# Patient Record
Sex: Female | Born: 1935 | Race: White | Hispanic: No | Smoking: Never smoker
Health system: Southern US, Community
[De-identification: ages and names within clinical notes are randomized; demographics above are authoritative.]

## PROBLEM LIST (undated history)

## (undated) DIAGNOSIS — F0390 Unspecified dementia without behavioral disturbance: Secondary | ICD-10-CM

## (undated) DIAGNOSIS — I219 Acute myocardial infarction, unspecified: Secondary | ICD-10-CM

## (undated) DIAGNOSIS — J449 Chronic obstructive pulmonary disease, unspecified: Secondary | ICD-10-CM

## (undated) DIAGNOSIS — I5020 Unspecified systolic (congestive) heart failure: Secondary | ICD-10-CM

## (undated) DIAGNOSIS — I639 Cerebral infarction, unspecified: Secondary | ICD-10-CM

## (undated) DIAGNOSIS — I10 Essential (primary) hypertension: Secondary | ICD-10-CM

---

## 1999-02-06 ENCOUNTER — Ambulatory Visit (HOSPITAL_BASED_OUTPATIENT_CLINIC_OR_DEPARTMENT_OTHER): Admission: RE | Admit: 1999-02-06 | Discharge: 1999-02-06 | Payer: Self-pay | Admitting: Plastic Surgery

## 1999-03-27 ENCOUNTER — Ambulatory Visit (HOSPITAL_BASED_OUTPATIENT_CLINIC_OR_DEPARTMENT_OTHER): Admission: RE | Admit: 1999-03-27 | Discharge: 1999-03-27 | Payer: Self-pay | Admitting: Plastic Surgery

## 2000-01-10 ENCOUNTER — Encounter: Payer: Self-pay | Admitting: Internal Medicine

## 2000-01-10 ENCOUNTER — Inpatient Hospital Stay (HOSPITAL_COMMUNITY): Admission: EM | Admit: 2000-01-10 | Discharge: 2000-01-15 | Payer: Self-pay | Admitting: Internal Medicine

## 2000-01-11 ENCOUNTER — Encounter: Payer: Self-pay | Admitting: Internal Medicine

## 2000-01-12 ENCOUNTER — Encounter: Payer: Self-pay | Admitting: Internal Medicine

## 2000-08-26 ENCOUNTER — Encounter (INDEPENDENT_AMBULATORY_CARE_PROVIDER_SITE_OTHER): Payer: Self-pay | Admitting: Specialist

## 2000-08-26 ENCOUNTER — Other Ambulatory Visit: Admission: RE | Admit: 2000-08-26 | Discharge: 2000-08-26 | Payer: Self-pay | Admitting: Gastroenterology

## 2000-12-14 ENCOUNTER — Encounter (HOSPITAL_COMMUNITY): Admission: RE | Admit: 2000-12-14 | Discharge: 2001-01-13 | Payer: Self-pay | Admitting: Orthopedic Surgery

## 2001-01-27 ENCOUNTER — Encounter (HOSPITAL_COMMUNITY): Admission: RE | Admit: 2001-01-27 | Discharge: 2001-02-26 | Payer: Self-pay | Admitting: Orthopedic Surgery

## 2001-03-01 ENCOUNTER — Encounter (HOSPITAL_COMMUNITY): Admission: RE | Admit: 2001-03-01 | Discharge: 2001-03-31 | Payer: Self-pay | Admitting: Orthopedic Surgery

## 2001-04-05 ENCOUNTER — Encounter (HOSPITAL_COMMUNITY): Admission: RE | Admit: 2001-04-05 | Discharge: 2001-05-05 | Payer: Self-pay | Admitting: Orthopedic Surgery

## 2001-06-10 ENCOUNTER — Encounter: Payer: Self-pay | Admitting: *Deleted

## 2001-06-10 ENCOUNTER — Inpatient Hospital Stay (HOSPITAL_COMMUNITY): Admission: EM | Admit: 2001-06-10 | Discharge: 2001-06-15 | Payer: Self-pay | Admitting: *Deleted

## 2001-06-10 ENCOUNTER — Encounter: Payer: Self-pay | Admitting: Family Medicine

## 2001-06-14 ENCOUNTER — Encounter: Payer: Self-pay | Admitting: Family Medicine

## 2001-07-19 ENCOUNTER — Ambulatory Visit (HOSPITAL_COMMUNITY): Admission: RE | Admit: 2001-07-19 | Discharge: 2001-07-19 | Payer: Self-pay | Admitting: Family Medicine

## 2001-07-19 ENCOUNTER — Encounter: Payer: Self-pay | Admitting: Family Medicine

## 2003-05-29 ENCOUNTER — Encounter (HOSPITAL_COMMUNITY): Admission: RE | Admit: 2003-05-29 | Discharge: 2003-06-28 | Payer: Self-pay | Admitting: Orthopaedic Surgery

## 2003-06-08 ENCOUNTER — Encounter: Payer: Self-pay | Admitting: Family Medicine

## 2003-06-08 ENCOUNTER — Ambulatory Visit (HOSPITAL_COMMUNITY): Admission: RE | Admit: 2003-06-08 | Discharge: 2003-06-08 | Payer: Self-pay | Admitting: Family Medicine

## 2003-06-14 ENCOUNTER — Encounter: Admission: RE | Admit: 2003-06-14 | Discharge: 2003-06-14 | Payer: Self-pay | Admitting: Internal Medicine

## 2003-06-14 ENCOUNTER — Encounter: Payer: Self-pay | Admitting: Internal Medicine

## 2003-06-29 ENCOUNTER — Encounter (HOSPITAL_COMMUNITY): Admission: RE | Admit: 2003-06-29 | Discharge: 2003-07-29 | Payer: Self-pay | Admitting: Orthopaedic Surgery

## 2004-01-18 ENCOUNTER — Emergency Department (HOSPITAL_COMMUNITY): Admission: EM | Admit: 2004-01-18 | Discharge: 2004-01-18 | Payer: Self-pay | Admitting: Emergency Medicine

## 2004-05-02 ENCOUNTER — Ambulatory Visit (HOSPITAL_COMMUNITY): Admission: RE | Admit: 2004-05-02 | Discharge: 2004-05-02 | Payer: Self-pay | Admitting: Otolaryngology

## 2004-07-25 ENCOUNTER — Other Ambulatory Visit: Admission: RE | Admit: 2004-07-25 | Discharge: 2004-07-25 | Payer: Self-pay | Admitting: Dermatology

## 2004-09-06 ENCOUNTER — Ambulatory Visit: Payer: Self-pay | Admitting: Internal Medicine

## 2004-09-26 ENCOUNTER — Ambulatory Visit: Payer: Self-pay | Admitting: Internal Medicine

## 2005-02-24 ENCOUNTER — Ambulatory Visit (HOSPITAL_COMMUNITY): Admission: RE | Admit: 2005-02-24 | Discharge: 2005-02-24 | Payer: Self-pay | Admitting: Family Medicine

## 2005-03-23 ENCOUNTER — Emergency Department (HOSPITAL_COMMUNITY): Admission: EM | Admit: 2005-03-23 | Discharge: 2005-03-23 | Payer: Self-pay | Admitting: Emergency Medicine

## 2005-03-25 ENCOUNTER — Ambulatory Visit (HOSPITAL_COMMUNITY): Admission: RE | Admit: 2005-03-25 | Discharge: 2005-03-25 | Payer: Self-pay | Admitting: Neurology

## 2005-03-25 ENCOUNTER — Ambulatory Visit: Payer: Self-pay | Admitting: Cardiology

## 2005-04-10 ENCOUNTER — Encounter (HOSPITAL_COMMUNITY): Admission: RE | Admit: 2005-04-10 | Discharge: 2005-05-10 | Payer: Self-pay | Admitting: Family Medicine

## 2005-05-13 ENCOUNTER — Encounter (HOSPITAL_COMMUNITY): Admission: RE | Admit: 2005-05-13 | Discharge: 2005-06-07 | Payer: Self-pay | Admitting: Family Medicine

## 2006-06-26 ENCOUNTER — Ambulatory Visit: Payer: Self-pay | Admitting: Internal Medicine

## 2006-06-26 LAB — CONVERTED CEMR LAB
ALT: 12 units/L (ref 0–40)
AST: 17 units/L (ref 0–37)
Chol/HDL Ratio, serum: 6.6
Cholesterol: 194 mg/dL (ref 0–200)
Creatinine, Ser: 0.7 mg/dL (ref 0.4–1.2)
HDL: 29.4 mg/dL — ABNORMAL LOW (ref >39.0)
Potassium: 3.9 meq/L (ref 3.5–5.1)

## 2006-08-14 ENCOUNTER — Emergency Department (HOSPITAL_COMMUNITY): Admission: EM | Admit: 2006-08-14 | Discharge: 2006-08-14 | Payer: Self-pay | Admitting: Emergency Medicine

## 2007-04-25 ENCOUNTER — Emergency Department (HOSPITAL_COMMUNITY): Admission: EM | Admit: 2007-04-25 | Discharge: 2007-04-25 | Payer: Self-pay | Admitting: Emergency Medicine

## 2007-07-26 ENCOUNTER — Ambulatory Visit (HOSPITAL_COMMUNITY): Admission: RE | Admit: 2007-07-26 | Discharge: 2007-07-26 | Payer: Self-pay | Admitting: Pulmonary Disease

## 2007-11-17 ENCOUNTER — Emergency Department (HOSPITAL_COMMUNITY): Admission: EM | Admit: 2007-11-17 | Discharge: 2007-11-17 | Payer: Self-pay | Admitting: Emergency Medicine

## 2008-03-02 ENCOUNTER — Ambulatory Visit (HOSPITAL_COMMUNITY): Admission: RE | Admit: 2008-03-02 | Discharge: 2008-03-02 | Payer: Self-pay | Admitting: Family Medicine

## 2008-08-24 ENCOUNTER — Ambulatory Visit: Payer: Self-pay | Admitting: Cardiology

## 2008-08-24 ENCOUNTER — Inpatient Hospital Stay (HOSPITAL_COMMUNITY): Admission: EM | Admit: 2008-08-24 | Discharge: 2008-08-29 | Payer: Self-pay | Admitting: Emergency Medicine

## 2008-08-25 ENCOUNTER — Encounter (INDEPENDENT_AMBULATORY_CARE_PROVIDER_SITE_OTHER): Payer: Self-pay | Admitting: Family Medicine

## 2008-11-06 ENCOUNTER — Emergency Department (HOSPITAL_COMMUNITY): Admission: EM | Admit: 2008-11-06 | Discharge: 2008-11-06 | Payer: Self-pay | Admitting: Emergency Medicine

## 2008-12-29 ENCOUNTER — Emergency Department (HOSPITAL_COMMUNITY): Admission: EM | Admit: 2008-12-29 | Discharge: 2008-12-29 | Payer: Self-pay | Admitting: Emergency Medicine

## 2009-03-27 DIAGNOSIS — R55 Syncope and collapse: Secondary | ICD-10-CM | POA: Insufficient documentation

## 2009-04-26 ENCOUNTER — Ambulatory Visit (HOSPITAL_COMMUNITY): Admission: RE | Admit: 2009-04-26 | Discharge: 2009-04-26 | Payer: Self-pay | Admitting: Family Medicine

## 2009-06-09 ENCOUNTER — Emergency Department (HOSPITAL_COMMUNITY): Admission: EM | Admit: 2009-06-09 | Discharge: 2009-06-09 | Payer: Self-pay | Admitting: Emergency Medicine

## 2009-10-15 ENCOUNTER — Emergency Department (HOSPITAL_COMMUNITY): Admission: EM | Admit: 2009-10-15 | Discharge: 2009-10-15 | Payer: Self-pay | Admitting: Emergency Medicine

## 2010-04-19 IMAGING — CT CT HEAD W/O CM
4 of 5 series · 12 of 47 positions shown, 13 images · non-contrast
Comparison: 04/26/2009

CT HEAD

CLINICAL DATA: Fall.  Head and neck trauma.

CT HEAD WITHOUT CONTRAST
CT CERVICAL SPINE WITHOUT CONTRAST
TECHNIQUE: Multidetector CT imaging of the head and cervical spine
was performed following the standard protocol without intravenous
contrast.  Multiplanar CT image reconstructions of the cervical
spine were also generated.

[Series 2: headseq 4.8 h37s · axial · 0.47mm/px · z∈[+86,+174]mm · 3 of 36 slices shown, 4 images]
[im 9/36  brain]
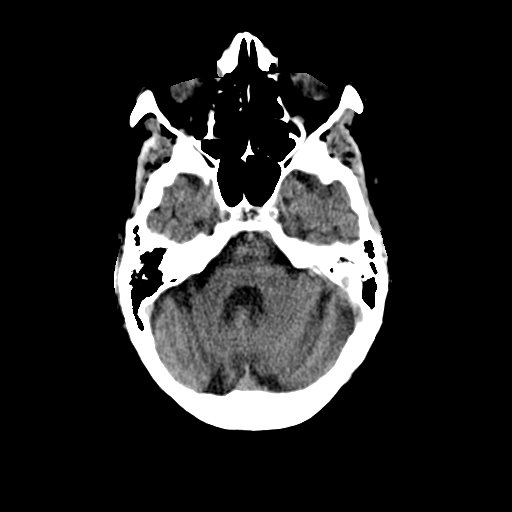
[im 9/36  bone]
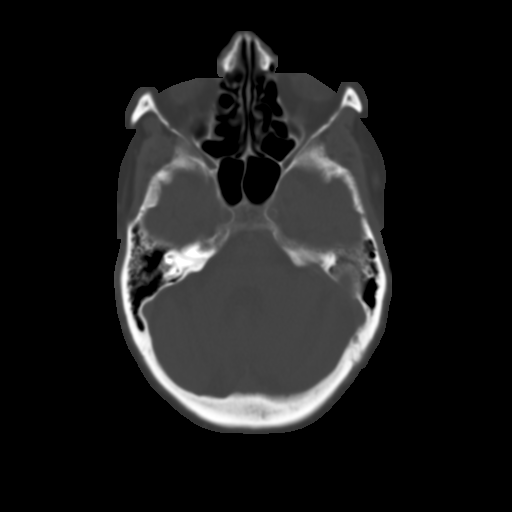
[im 18/36  brain]
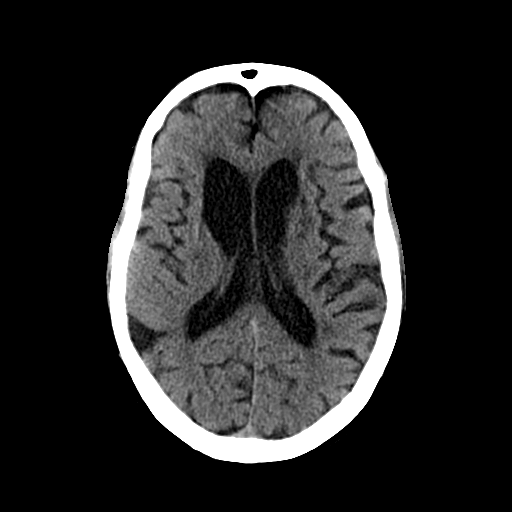
[im 27/36  brain]
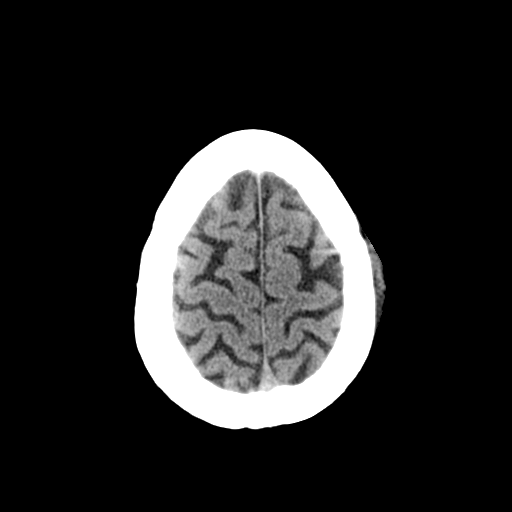

[Series 7: cervical coro (id) · coronal · 0.19mm/px · 3 of 40 slices shown]
[im 14/40  brain]
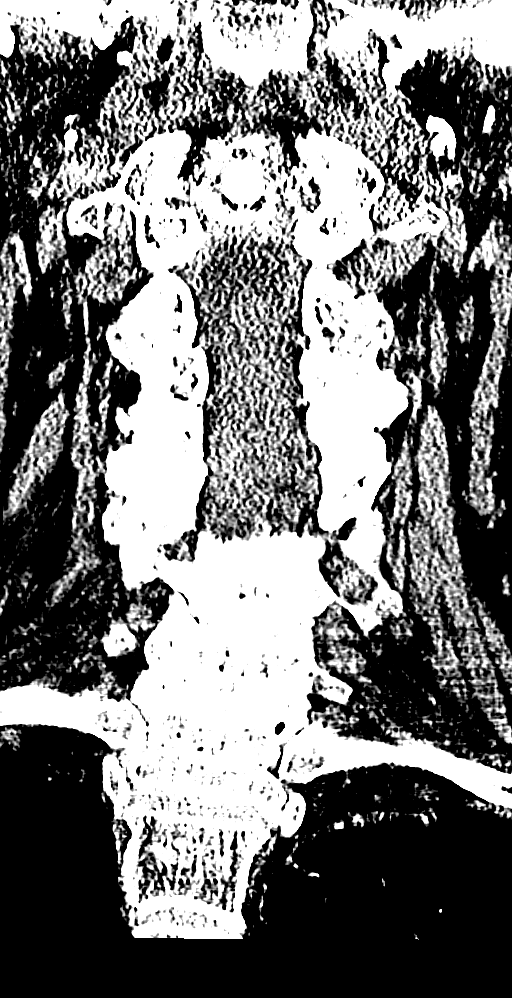
[im 18/40  brain]
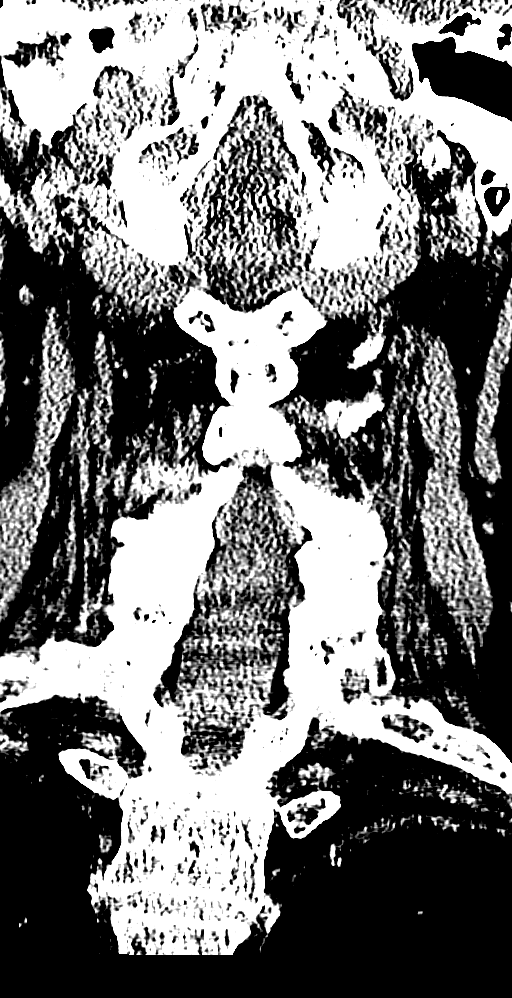
[im 22/40  brain]
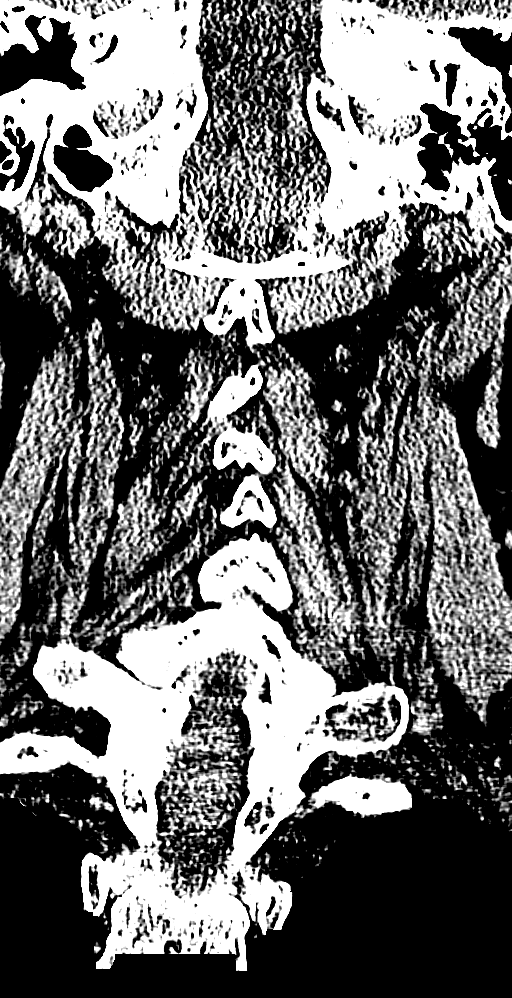

[Series 8: cervical sag (id) · sagittal · 0.16mm/px · 3 of 35 slices shown]
[im 12/35  brain]
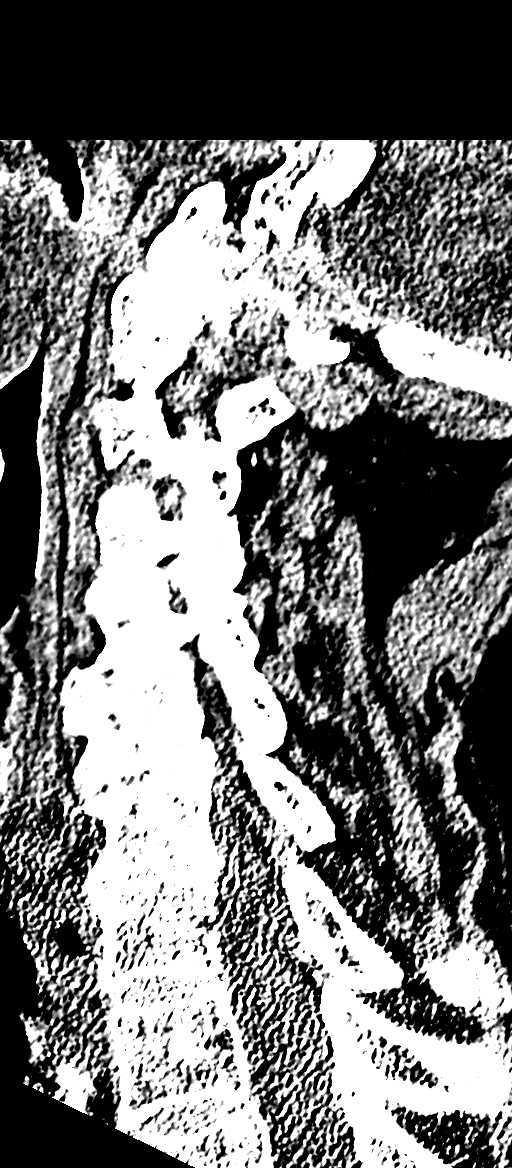
[im 18/35  brain]
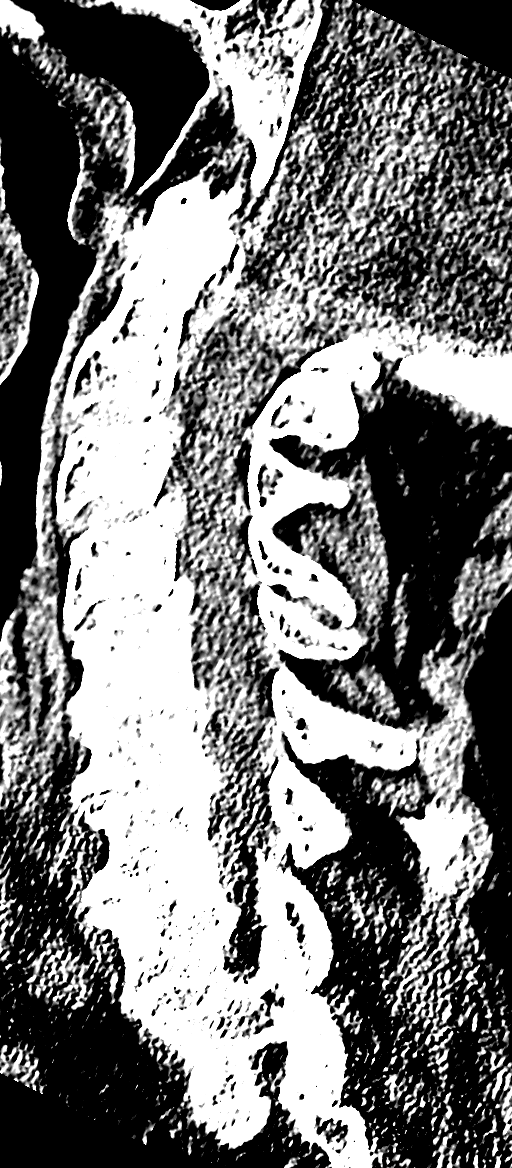
[im 23/35  brain]
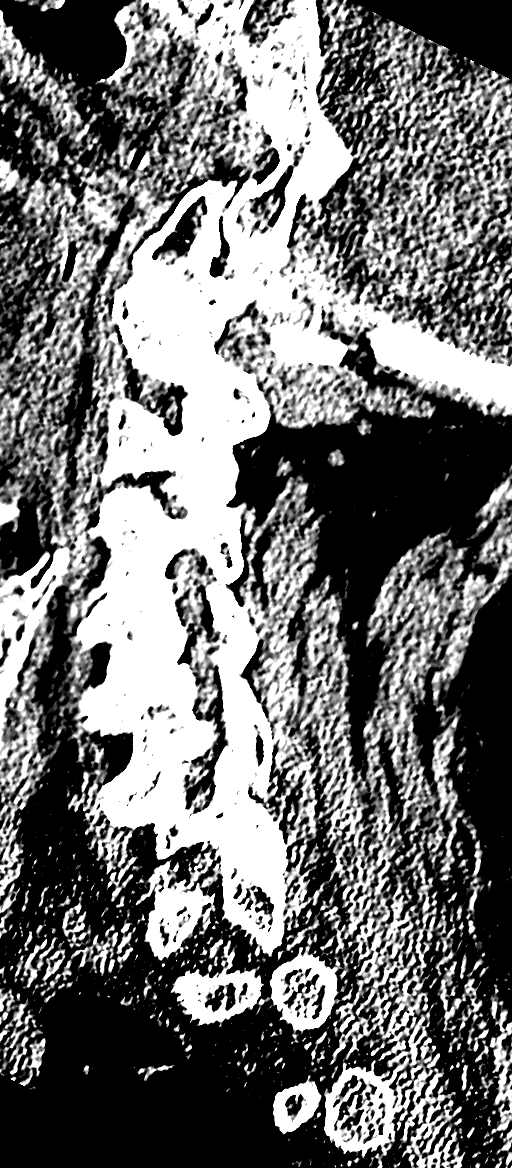

[Series 9: cervical axial (id) · axial · 0.16mm/px · z∈[-114,-70]mm · 3 of 94 slices shown]
[im 8/94  brain]
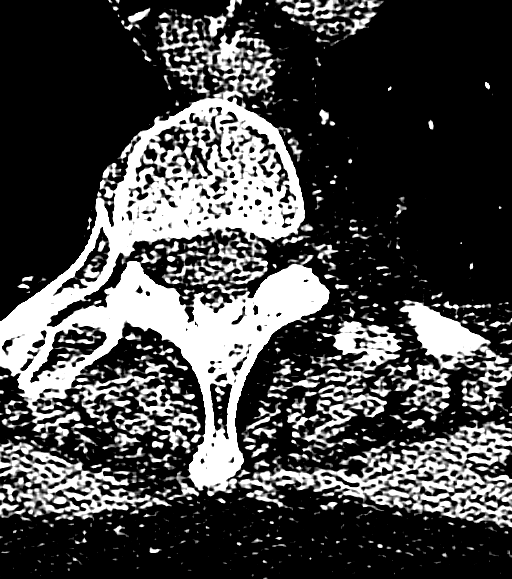
[im 24/94  brain]
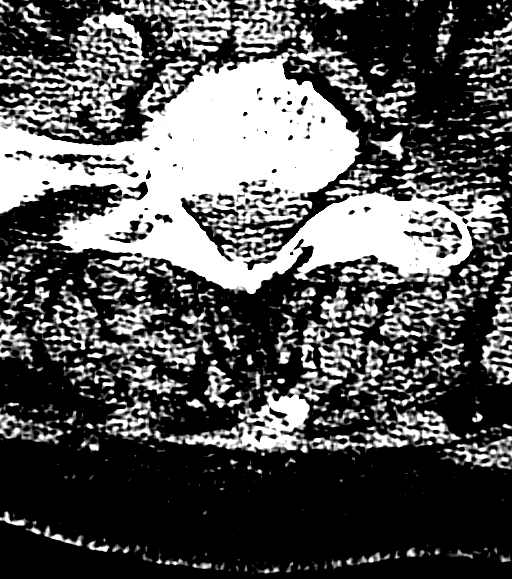
[im 32/94  brain]
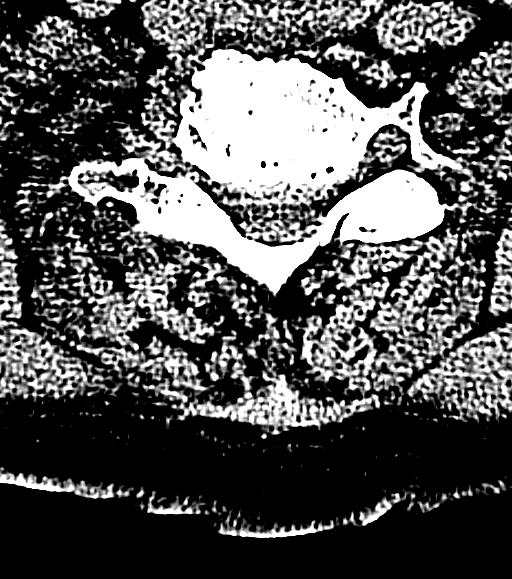

[12 of 47 positions shown; findings below may reference images not displayed]

FINDINGS: There is scalp swelling on the left.  No underlying skull
fracture.  No traumatic fluid in the sinuses, middle ears or
mastoids.  There is extensive brain atrophy with widespread
infarctions affecting the cerebellum, brainstem, basal ganglia,
thalami, hemispheric white matter and right temporal lobe.  No
identifiably acute infarction.  No sign of mass lesion, hemorrhage,
hydrocephalus or extra-axial collection.
IMPRESSION: No acute intracranial finding.  Left sided scalp swelling.  No
skull fracture.  Atrophy and extensive chronic appearing ischemic
changes.

CT CERVICAL SPINE
FINDINGS: There is scoliosis convex to the left.  There is no
evidence of fracture or traumatic malalignment.  There is facet
arthropathy on the right at C3-4 and C4-5 and on the left at C3-4.
Facets are fused at C2-3 and C5-6.  No severe stenosis of the
central canal suspected.
IMPRESSION: No acute or traumatic finding.

## 2010-07-07 ENCOUNTER — Emergency Department (HOSPITAL_COMMUNITY): Admission: EM | Admit: 2010-07-07 | Discharge: 2010-07-07 | Payer: Self-pay | Admitting: Emergency Medicine

## 2010-09-29 ENCOUNTER — Encounter: Payer: Self-pay | Admitting: Otolaryngology

## 2010-11-27 LAB — URINE MICROSCOPIC-ADD ON

## 2010-11-27 LAB — DIFFERENTIAL
Basophils Relative: 1 % (ref 0–1)
Lymphocytes Relative: 23 % (ref 12–46)
Monocytes Absolute: 0.5 10*3/uL (ref 0.1–1.0)
Monocytes Relative: 8 % (ref 3–12)
Neutro Abs: 3.9 10*3/uL (ref 1.7–7.7)
Neutrophils Relative %: 63 % (ref 43–77)

## 2010-11-27 LAB — CBC
HCT: 38.1 % (ref 36.0–46.0)
Hemoglobin: 12.9 g/dL (ref 12.0–15.0)
MCHC: 33.8 g/dL (ref 30.0–36.0)
MCV: 89.5 fL (ref 78.0–100.0)
Platelets: 161 10*3/uL (ref 150–400)
RDW: 14.3 % (ref 11.5–15.5)

## 2010-11-27 LAB — COMPREHENSIVE METABOLIC PANEL
Albumin: 3.7 g/dL (ref 3.5–5.2)
BUN: 20 mg/dL (ref 6–23)
Calcium: 9.8 mg/dL (ref 8.4–10.5)
Creatinine, Ser: 0.67 mg/dL (ref 0.4–1.2)
Glucose, Bld: 114 mg/dL — ABNORMAL HIGH (ref 70–99)
Total Protein: 7 g/dL (ref 6.0–8.3)

## 2010-11-27 LAB — URINALYSIS, ROUTINE W REFLEX MICROSCOPIC
Bilirubin Urine: NEGATIVE
Ketones, ur: NEGATIVE mg/dL
Nitrite: POSITIVE — AB
Specific Gravity, Urine: 1.015 (ref 1.005–1.030)
Urobilinogen, UA: 0.2 mg/dL (ref 0.0–1.0)

## 2010-12-18 LAB — URINALYSIS, ROUTINE W REFLEX MICROSCOPIC
Glucose, UA: NEGATIVE mg/dL
Protein, ur: 30 mg/dL — AB
Specific Gravity, Urine: >1.03 — ABNORMAL HIGH (ref 1.005–1.030)
Urobilinogen, UA: 0.2 mg/dL (ref 0.0–1.0)

## 2010-12-18 LAB — URINE MICROSCOPIC-ADD ON

## 2010-12-18 LAB — DIFFERENTIAL
Eosinophils Relative: 3 % (ref 0–5)
Lymphocytes Relative: 10 % — ABNORMAL LOW (ref 12–46)
Lymphs Abs: 0.6 10*3/uL — ABNORMAL LOW (ref 0.7–4.0)
Monocytes Absolute: 0.5 10*3/uL (ref 0.1–1.0)
Monocytes Relative: 7 % (ref 3–12)

## 2010-12-18 LAB — BASIC METABOLIC PANEL
BUN: 21 mg/dL (ref 6–23)
CO2: 30 mEq/L (ref 19–32)
Calcium: 9.2 mg/dL (ref 8.4–10.5)
Chloride: 100 mEq/L (ref 96–112)
Creatinine, Ser: 0.91 mg/dL (ref 0.4–1.2)
GFR calc Af Amer: >60 mL/min (ref >60)

## 2010-12-18 LAB — CBC
MCHC: 34.6 g/dL (ref 30.0–36.0)
MCV: 88.3 fL (ref 78.0–100.0)
RDW: 14.2 % (ref 11.5–15.5)

## 2011-01-21 NOTE — Group Therapy Note (Signed)
NAMEBRITTANEY, [name]              ACCOUNT NO.:  [id number]   MEDICAL RECORD NO.:  [id number]           PATIENT TYPE:  INP   LOCATION:  A337                          FACILITY:  [facility]   PHYSICIAN:  Angus G. Renard Matter, MD   DATE OF BIRTH:  06/19/1919   DATE OF PROCEDURE:  DATE OF DISCHARGE:                                 PROGRESS NOTE   This patient was admitted with probable TIA.  She has slurred speech and  facial drooping.  She does have a history of dementia.   OBJECTIVE:  VITAL SIGNS:  Blood pressure 171/106, respiration 22, pulse  76, and temperature 97.9.  LUNGS:  Clear to P and A.  HEART:  Regular rhythm.  ABDOMEN:  No palpable organs or masses.   Chemistries within normal limits.  Urinalysis increased number of wbc's.   ASSESSMENT:  The patient was admitted with above-stated problems and  appears to be improved.      Angus G. Renard Matter, MD  Electronically Signed     AGM/MEDQ  D:  08/28/2008  T:  08/28/2008  Job:  [id number]

## 2011-01-21 NOTE — Consult Note (Signed)
NAMEJACOBY, RITSEMA              ACCOUNT NO.:  [id number]   MEDICAL RECORD NO.:  [id number]          PATIENT TYPE:  INP   LOCATION:  A337                          FACILITY:  [facility]   PHYSICIAN:  Kofi A. Gerilyn Pilgrim, M.D. DATE OF BIRTH:  05-06-1919   DATE OF CONSULTATION:  DATE OF DISCHARGE:                                 CONSULTATION   REASON FOR CONSULTATION:  Facial drooping, possible stroke.   The patient is an 77 year old white female with a baseline history of  dementia.  Patient presented with facial drooping and slurred speech,  also apparently had some shortness of breath.   PAST MEDICAL HISTORY:  Significant for:  1. Dementia.  2. Hypertension.  3. Headache.  4. Osteoporosis.   PAST SURGICAL HISTORY:  Hysterectomy.   SOCIAL HISTORY:  Lives at a nursing home facility.  No tobacco use.  No  alcohol use.   ALLERGIES:  NONE KNOWN.   REVIEW OF SYSTEMS:  Unchanged from Dr. Renard Matter.   MEDICATIONS:  1. Namenda 10 mg b.i.d..  2. Metoprolol 25 mg.  3. Aspirin 81 mg.  4. Aricept 10 mg.   PHYSICAL EXAMINATION:  Temperature 97.9.  Pulse 76.  Respiratory rate  20.  Blood pressure 171/106.  HEENT EVALUATION:  Neck is supple.  Head is normocephalic, atraumatic.  ABDOMEN: Soft.  EXTREMITIES:  No significant edema.  MENTATION:  Patient is awake and alert.  She is essentially oriented x1.  She could not tell us where she is located or why she is here.  She does  follow commands well and speaks in full clear sentences.  She does have  paucity of speech, however, unless prompted.  CRANIAL NERVE EVALUATION:  Pupils are 3 mm, somewhat irregularly shaped  but reactive.  Visual fields are intact.  Extraocular movements are  intact.  Facial muscle strength shows flattening of the nasolabial fold  on the left side.  Tongue is midline.  Direct facial muscle strength  seems normal despite mild asymmetry on the left side.  MOTOR EXAMINATION:  She does seem to have a mild left pronator  drift and  mild weakness of deltoid and triceps on the left side.  There may also  be some mild weakness of the proximal left leg.  Other muscle groups  show normal tone, bulk, and strength.  Reflexes are preserved.  Coordination shows no tremors or dysmetria.  There is no past-pointing.   SUPPORTIVE DATA:  Head CT scan of brain shows marked atrophy and  extensive white matter chronic ischemic changes.  Carotid Dopplers  negative.  WBC 6.3, hemoglobin 12, platelet count of 151.  Urinalysis,  nitrites positive, large blood, leukocytes large.  Microscopic, WBC too  numerous to count, RBC 21 to 50, bacteria many.   ASSESSMENT:  1. Likely subcortical lacunar infarct involving the right hemisphere.  2. Marked underlying dementia.  3. Urinary tract infection.   RECOMMENDATIONS:  Continue with current care.  She has an echo which is  pending.  We may want to switch the aspirin to Aggrenox like starting  the Aggrenox once a day for about a week  and increasing to b.i.d.  dosing.      Kofi A. Gerilyn Pilgrim, M.D.  Electronically Signed     KAD/MEDQ  D:  08/28/2008  T:  08/28/2008  Job:  [id number]

## 2011-01-21 NOTE — H&P (Signed)
NAMEPRINCELLA, JASKIEWICZ              ACCOUNT NO.:  [id number]   MEDICAL RECORD NO.:  [id number]          PATIENT TYPE:  INP   LOCATION:  A337                          FACILITY:  [facility]   PHYSICIAN:  Angus G. Renard Matter, MD   DATE OF BIRTH:  08-Mar-1919   DATE OF ADMISSION:  08/24/2008  DATE OF DISCHARGE:  LH                              HISTORY & PHYSICAL   This patient was brought into the emergency department with shortness of  breath, slurring of speech, and facial drooping.  She is evaluated by ED  physician.  She is 77 year old.  CT of the head was done shortly after  the admission to emergency department, which showed no acute  intracranial abnormality, but atrophy and extensive chronic  microvascular ischemic changes, bilateral lacunar infarctions.  Chest x-  ray, chronic lung changes.  No acute pulmonary findings.   PERTINENT LABORATORY DATA:  BMET within normal limits.  CBC:  WBC 6300  with hemoglobin 12.0, hematocrit 36.1.  Urinalysis:  Increased number of  wbc's, positive nitrites.  Cardiac marker within normal limits.  The  patient started on intravenous fluids, nasal O2, and subsequently was  admitted.   SOCIAL HISTORY:  The patient is nonsmoker, drink alcohol.  Lives in  nursing facility.   SURGICAL HISTORY:  Hysterectomy.   PAST MEDICAL HISTORY:  1. Hypertension.  2. Dementia.  3. Headache.  4. Osteoporosis.   ALLERGIES:  No known drug allergies.   REVIEW OF SYSTEMS:  HEENT:  Negative.  CARDIOPULMONARY:  Shortness of  breath, but no cough.  GI:  No bowel irregularity or bleeding.  GU:  No  dysuria or hematuria.   MEDICATIONS:  1. Namenda 10 mg b.i.d.  2. Metoprolol 25 mg daily.  3. Aspirin 81 mg daily.  4. Aricept 10 mg daily.   PHYSICAL EXAMINATION:  GENERAL:  Alert white female.  VITAL SIGNS:  Blood pressure 205/73, pulse 68, and respirations 16.  HEENT:  Eyes:  PERRLA.  TMs negative.  Ptosis on the left.  ENT:  Mucous  membranes dry.  NECK:  Supple.  No  JVD or thyroid abnormalities.  HEART:  Regular rhythm.  No murmurs.  LUNGS:  Clear to P and A.  ABDOMEN:  No palpable organs or masses.  EXTREMITIES:  Free of edema.  NEUROLOGIC:  Left facial droop.  Motor function normal.  No abnormal  reflexes.   ASSESSMENT:  The patient admitted with shortness of breath, possible  transient ischemic attack or cerebrovascular accident, and hypertension.      Angus G. Renard Matter, MD  Electronically Signed     AGM/MEDQ  D:  08/25/2008  T:  08/25/2008  Job:  [id number]

## 2011-01-21 NOTE — Group Therapy Note (Signed)
NAMEVEDIKA, DUMLAO              ACCOUNT NO.:  [id number]   MEDICAL RECORD NO.:  [id number]          PATIENT TYPE:  INP   LOCATION:  A337                          FACILITY:  [facility]   PHYSICIAN:  Angus G. Renard Matter, MD   DATE OF BIRTH:  [birth year]   DATE OF PROCEDURE:  DATE OF DISCHARGE:                                 PROGRESS NOTE   SUBJECTIVE:  This patient was admitted to the ED with a shortness of  breath, slurred speech, and facial drooping.  CT of the head was done  shortly after admission, it showed no acute intracranial abnormality,  but atrophy and extensive chronic microvascular ischemic changes,  bilateral lacunar infarctions.  Chest x-ray was essentially negative.   OBJECTIVE:  VITAL SIGNS:  Blood pressure 155/79, respirations 22, and  pulse 57.  GENERAL:  The patient remains alert.  HEART:  Regular rhythm.  LUNGS:  Diminished breath sounds bilaterally.  ABDOMEN:  No palpable organs or masses.  NEUROLOGIC:  No focal deficit.   Urine did show evidence of increased number of WBCs.   ASSESSMENT:  The patient was admitted with what was felt to be possible  transient ischemic attack or cerebrovascular accident, hypertension,  urinary tract infection.   PLAN:  To proceed with further workup to include carotid Doppler  ultrasound.      Angus G. Renard Matter, MD  Electronically Signed     AGM/MEDQ  D:  08/26/2008  T:  08/26/2008  Job:  [id number]

## 2011-01-21 NOTE — Group Therapy Note (Signed)
[name], [name]              ACCOUNT NO.:  [id number]   MEDICAL RECORD NO.:  [id number]          PATIENT TYPE:  INP   LOCATION:  A337                          FACILITY:  [facility]   PHYSICIAN:  Kofi A. Gerilyn Pilgrim, M.D. DATE OF BIRTH:  1918/12/25   DATE OF PROCEDURE:  DATE OF DISCHARGE:  08/29/2008                                 PROGRESS NOTE   The patient is awake and alert.  She is oriented to person.  She knows  she is [place], but does not know in what facility she is located.  She does follow commands well.  She continuous to have drooping of the  nasolabial fold on the left side.  She has good strength to the left  upper extremity.  Carotid Doppler shows no hemodynamic significant  stenosis.  She is afebrile, 98.1, pulse 73, respirations 20, blood  pressure 187/80.   ASSESSMENT AND PLAN:  Right subcortical infarcts.  We will go ahead and  add Aggrenox one a day and increase it to t.i.d. dose after a week.      Kofi A. Gerilyn Pilgrim, M.D.  Electronically Signed     KAD/MEDQ  D:  08/29/2008  T:  08/30/2008  Job:  [id number]

## 2011-01-21 NOTE — Group Therapy Note (Signed)
NAMESHERRE, [name]              ACCOUNT NO.:  [id number]   MEDICAL RECORD NO.:  [id number]          PATIENT TYPE:  INP   LOCATION:  A337                          FACILITY:  [facility]   PHYSICIAN:  Edward L. Juanetta Gosling, M.D.DATE OF BIRTH:  12-Dec-1918   DATE OF PROCEDURE:  DATE OF DISCHARGE:                                 PROGRESS NOTE   Ms. [name] was admitted with probable TIA.  She had slurred speech and  facial drooping.  That all seems to have resolved at this point and this  morning she is awake and alert, able to hold the conversation.  She has  no focal neurological findings.  She is confused, but has a history of  dementia.   PHYSICAL EXAMINATION:  GENERAL:  She is awake and alert.  VITAL SIGNS:  Her temperature is 97.7, pulse 68, respirations 24, blood  pressure 165/85, and O2 sats 97% on 3L.  HEENT:  Her face appears symmetrical to me.  CHEST:  Clear.   ASSESSMENT:  I think overall she seems to have improved.   PLAN:  Continue with treatments and follow.      Edward L. Juanetta Gosling, M.D.  Electronically Signed     ELH/MEDQ  D:  08/27/2008  T:  08/27/2008  Job:  [id number]

## 2011-01-24 NOTE — Procedures (Signed)
[name], [name]              ACCOUNT NO.:  [id number]   MEDICAL RECORD NO.:  [id number]          PATIENT TYPE:  OUT   LOCATION:  RAD                           FACILITY:  [facility]   PHYSICIAN:  Thomas C. Wall, M.D.   DATE OF BIRTH:  01-08-1919   DATE OF PROCEDURE:  03/25/2005  DATE OF DISCHARGE:                                  ECHOCARDIOGRAM   INDICATION:  Stroke (Code 04540).   CLINICAL INFORMATION:  The echocardiogram was technically quite good.   CONCLUSION:  1.  Mild to moderate left atrial enlargement.  2.  No obvious left atrial mass or thrombus.  3.  Thickened mitral valve.  4.  No mitral regurgitation.  5.  Normal left ventricular chamber size and overall systolic function.      Ejection fraction greater than or equal to 60%.  6.  Asymmetric septal hypertrophy without left ventricular outflow tract      obstruction.  7.  Mild left ventricular hypertrophy.  8.  Normal aortic valve.  9.  Normal right-sided structures and function.  10. Normal inferior vena cava.  11. No pericardial effusion.   There is no obvious source of clot or mass on this study.  If more  definitive diagnosis is needed, a transesophageal echocardiogram would be  more sensitive.       TCW/MEDQ  D:  03/26/2005  T:  03/26/2005  Job:  [id number]   cc:   Angus G. Renard Matter, MD  [address]  [place]  [place] [address]  Fax: [phone]   Kofi A. Gerilyn Pilgrim, M.D.  [address], [place]  [place]  [place] [address]  Fax: [phone]

## 2011-01-24 NOTE — Discharge Summary (Signed)
[name], [name]              ACCOUNT NO.:  [id number]   MEDICAL RECORD NO.:  [id number]          PATIENT TYPE:  INP   LOCATION:  A337                          FACILITY:  [facility]   PHYSICIAN:  Angus G. Renard Matter, MD   DATE OF BIRTH:  [birth year]   DATE OF ADMISSION:  08/24/2008  DATE OF DISCHARGE:  12/22/2009LH                               DISCHARGE SUMMARY   ADDENDUM:   MEDICATION LIST AT DISCHARGE:  1. Cipro 500 mg b.i.d. for 1 week.  2. Aggrenox 1 daily.  3. Metoprolol 25 mg b.i.d.      Angus G. Renard Matter, MD  Electronically Signed     AGM/MEDQ  D:  09/11/2008  T:  09/11/2008  Job:  [id number]
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
## 2011-01-24 NOTE — H&P (Signed)
[facility]  Patient:    [name], [name]                    MRN: [id number] Adm. Date:  01/10/00 Attending:  Titus Dubin. Alwyn Ren, M.D. [facility] CC:         Butch Penny, M.D.                         History and Physical  HISTORY OF PRESENT ILLNESS:  [name] is a 77 year old white female, who presents with severe right-sided chest pain, present since March 3.  It has varied in intensity and she is unable to quantitate it.  She denied any nausea or diaphoresis.  The pain has not radiated from the right upper chest.  It has been associated with shortness of breath and some pleuritic character of the pain.  PAST MEDICAL HISTORY:  Her past history includes bronchiectasis.  She has had asthmatic bronchitis and pneumonia.  Her physician in her home community in [place], Dr. Butch Penny, had recently treated her for pneumonia as an outpatient.  Additionally, she had been seen by  Dr. Sandrea Hughs for bronchiectasis.  She has a history of cecal polyp in the setting of a family history of colon cancer and polyps.  She also has a history of irritable bowel syndrome.  Past history includes fracture of foot, September of 1994 for which she had a pinning.  She has had a hysterectomy related to dysmenorrhagia.  She had an incidental oophorectomy and appendectomy.  She has also had tonsillectomy and adenoidectomy and otic surgery.  She has a diagnosis of cardiac enlargement, first-degree A-V block and variable  intraventricular conduction delay.  She has been treated for occipital neuralgia in the past and also has osteoporosis.  FAMILY HISTORY:  Includes myocardial infarction in two sisters; one had a heart  attack at age 79.  Her father also had myocardial infarction.  Colon cancer is found in her mother, maternal uncles, and three sisters.  One sister had asthma.  A brother had throat cancer.  MEDICATIONS: 1. Ortho-est 1.25 mg daily. 2.  Pulmicort and Serevent inhalers and Nasarel. 3. She is also on Toprol XL 50 mg one-half daily.  P.R.N. medications include Pepcid or Zantac and Tussionex.  ALLERGIES:  She has no known drug allergies.  HABITS:  She has never smoked.  REVIEW OF SYSTEMS:  Paroxysmal cough and generalized weakness and chest discomfort. Intermittently she has had dysuria and has been on Macrodantin.  She has negative GI review of systems at this time.  She has been using her metered dose inhaler as needed.  She describes daily weakness.  PHYSICAL EXAMINATION:  VITAL SIGNS:  Examination reveals the patient to be in acute discomfort with a pulse of 117, blood pressure 150/78, respiratory rate 28 to 32.  O2 saturations  were 92% on room air.  GENERAL APPEARANCE:  She is pale without cyanosis.  Arterial narrowing is present.  HEENT:  Otolaryngologic examination is essentially unremarkable.  She has no lymphadenopathy about the head, neck or axilla.  CHEST:  Breath sounds are decreased at the bases.  No rubs are noted.  She exhibits a resting tachycardia with a gallop ______.  ABDOMEN:  Nontender without organomegaly.  EXTREMITIES:  Pedal pulses were decreased.  Homans sign was negative.  NEUROLOGICAL:  There were no localized or neurologic signs.  ECG revealed left bundle-branch block and nonspecific ST-T wave changes.  She was  given 325 mg of aspirin to chew and swallow.  IV was started and EMS called for transport to the hospital.  Her chest pain is somewhat atypical.  Cardiology consultation will be pursued because of a strong family history.  Cardiac enzymes were negative, then a spiral CT will be considered.  A heparin protocol will be initiated pending these results. DD:  01/12/00 TD:  01/13/00 Job: [id number]
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
## 2011-01-24 NOTE — Discharge Summary (Signed)
[facility]  Patient:    [name], [name]                     MRN: [id number] Adm. Date:  [id number] Disc. Date: [id number] Attending:  Dolores Patty CC:         Butch Penny, M.D.             Titus Dubin. Alwyn Ren, M.D. [facility]             Michael B. Sherene Sires, M.D. [facility]                           Discharge Summary  HISTORY OF PRESENT ILLNESS:  [name] is a 77 year old white female patient of Dr. Marga Melnick who also sees Dr. Butch Penny in [place]. The patient presented to the office with severe right sided chest pain for several weeks. It was associated with shortness of breath and had a pleuritic component. Please see the dictated history and physical for further details.  HOSPITAL COURSE BY PROBLEM: #1 - PULMONARY:  The patient was admitted to the intensive care unit due to concerns about unstable angina. She was seen in consultation there by Dr. Willa Rough, [facility]. Her x-ray was consistent with pneumonia. Apparently, she had a previous history of pneumonia 3 weeks prior at [facility]. The pain was not felt to be cardiac. CT scan of the chest was obtained. It was suspicious for a right hilar mass. The patient was seen in consultation by Dr. Sandrea Hughs. He had previously followed the patient in the office for her bronchiectasis with pneumonia. He also felt that her symptoms were probably disproportionate to her degree of findings and felt that maybe gastroesophageal reflux disease could contribute to her problem. He was also concerned about her ability to take her medicines as prescribed. A pharmacy calendar was given to the patient. An extended explanation was given to the patient regarding the bronchiectasis with the pneumonia and the fact that she was at increased risk for pneumonia due to this problem. She was placed on new medications by Dr. Sherene Sires and she will continue on these as an outpatient.  #2 - URINARY  TRACT INFECTION:  The patient did have Klebsiella which was sensitive to Levaquin. This should be well covered by the Levaquin given to her for her pneumonia with bronchiectasis.  On the day of discharges, she was ambulating in the halls without shortness of breath.  DISCHARGE LABS:  WBC count 7400, hemoglobin 12.7, electrolytes within normal limits.  DISCHARGE DIAGNOSES: 1. Bronchiectasis with pneumonia. 2. Gastroesophageal reflux disease. 3. Urinary tract infection.  DISCHARGE MEDICATIONS: 1. Ortho-Est 1.25 mg once a day. 2. Toprol XL 50 mg half tablet p.o. q.d. 3. Protonix 20 mg p.o. q.d. 4. Nasarel nasal spray 2 sprays in each nostril once a day. 5. Serevent discus 1 inhalation before Pulmicort twice daily. 6. Pulmicort inhaler 4 sprays twice daily. 7. Levaquin 500 mg once daily to complete a 3 week course. 8. Robitussin DM 1 teaspoon 4 times daily.  DISCHARGE DIET:  Regular.  DISCHARGE ACTIVITY:  As tolerated.  FOLLOW-UP:  The patient will schedule an appointment with Dr. Sherene Sires to see him in approximately 3 weeks. The patient will determine who her primary care doctor is and will schedule that follow-up with Dr. Renard Matter or with Dr. Quintella Reichert in the near future. DD:  01/15/00 TD:  01/17/00 Job: [id number]
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
## 2011-01-24 NOTE — Discharge Summary (Signed)
[name], [name]              ACCOUNT NO.:  [id number]   MEDICAL RECORD NO.:  [id number]          PATIENT TYPE:  INP   LOCATION:  A337                          FACILITY:  [facility]   PHYSICIAN:  Angus G. Renard Matter, MD   DATE OF BIRTH:  08/03/1919   DATE OF ADMISSION:  08/24/2008  DATE OF DISCHARGE:  12/22/2009LH                               DISCHARGE SUMMARY   DIAGNOSES:  1. Cerebrovascular accident.  2. Hypertension.  3. History of dementia.  4. Osteoporosis.   The patient's condition stable at the time of her discharge.   The patient was admitted through the emergency department with shortness  of breath, slurring of speech, and facial drooping.  She was evaluated  by ED physician.  She was 89 years.  CT of the head was done shortly  after admission, which showed no acute intracranial abnormality, but  atrophy and extensive chronic microvascular ischemic changes, bilateral  lacunar infarctions.   PHYSICAL EXAMINATION:  GENERAL:  Elderly female.  VITAL SIGNS:  Blood pressure with 205/73, pulse 68, respirations 16.  HEENT: Eyes, PERRLA.  TMs negative.  Oropharynx benign.  NECK:  Supple.  No JVD or thyroid abnormalities.  HEART:  Regular rhythm.  No murmurs.  LUNGS:  Clear to P&A.  ABDOMEN:  No palpable organs or mass.  EXTREMITIES:  Free of edema.  NEUROLOGIC:  Left facial droop.  Motor function normal.  No abnormal  reflexes.   LABORATORY DATA:  CBC, WBC 6300 with hemoglobin 12.0, hematocrit 36.1.  Chemistries, sodium 140, potassium 3.6, chloride 105, CO2 of 38, glucose  125, BUN 13, creatinine 0.70.  Chemistries done on August 26, 2008,  sodium 142, potassium 3.5, chloride 109, CO2 of 26, glucose 108, BUN 11,  creatinine 0.64.  Liver enzymes, SGOT 18, SGPT 11, alkaline phosphatase  62, bilirubin 0.6.  Urinalysis 21-50 wbc's.  Urine culture, no growth.  X-rays, chest x-ray, mild cardiac enlargement, chronic lung changes, no  acute pulmonary findings.  CT of the head, no  acute intracranial  abnormality, atrophy, and chronic microvascular ischemic changes, and  bilateral lacunar infarctions.  Carotid duplex ultrasounds, minimal  plaque formation and carotid bifurcation.  No evidence of  hemodynamically significant stenosis.  No significant change since prior  study.   HOSPITAL COURSE:  The patient at the time of her admission was placed on  mechanical soft diet.  She was continued on Aricept 10 mg daily, Namenda  10 mg b.i.d., metoprolol 25 mg daily, aspirin 81 mg daily, tobramycin  sulfate 2 drops both eyes twice a day.  She was started on Lovenox 40 mg  subcutaneously daily.  Vital signs and NeuroChecks were monitored.  She  was placed on nasal O2 at 2 L per minute.  She was started on Cipro 500  mg b.i.d.  Catheterized urine specimen was sent for C&S, which showed no  growth.  The patient was seen in consultation by Dr. Gerilyn Pilgrim who  started her on Aggrenox 1 daily for a week and b.i.d.  The patient  stabilized, was able to be sent back to [facility], but it was felt  he had a small stroke, but was in stable condition at the time of her  discharge to [facility].   DISCHARGE MEDICATIONS:  The patient was discharged on following  medications,  1. Aricept 10 mg daily.  2. Namenda 10 mg b.i.d.  3. Aspirin 81 mg daily.  4. Tobramycin drops 2 in each eye b.i.d.  5. Imodium 2 mg p.r.n.  6. Aggrenox 1 b.i.d.      Angus G. Renard Matter, MD  Electronically Signed     AGM/MEDQ  D:  09/11/2008  T:  09/11/2008  Job:  [id number]
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
## 2011-05-22 ENCOUNTER — Other Ambulatory Visit (HOSPITAL_COMMUNITY): Payer: Self-pay | Admitting: Family Medicine

## 2011-05-26 ENCOUNTER — Ambulatory Visit (HOSPITAL_COMMUNITY): Admission: RE | Admit: 2011-05-26 | Payer: Medicare Other | Source: Ambulatory Visit

## 2011-05-29 ENCOUNTER — Ambulatory Visit (HOSPITAL_COMMUNITY)
Admission: RE | Admit: 2011-05-29 | Discharge: 2011-05-29 | Disposition: A | Discharge status: Home / Self Care | Payer: Medicare Other | Source: Ambulatory Visit | Attending: Family Medicine | Admitting: Family Medicine

## 2011-05-29 ENCOUNTER — Ambulatory Visit (HOSPITAL_COMMUNITY)
Admission: RE | Admit: 2011-05-29 | Discharge: 2011-05-29 | Payer: Medicare Other | Source: Ambulatory Visit | Attending: Family Medicine | Admitting: Family Medicine

## 2011-05-29 DIAGNOSIS — R1310 Dysphagia, unspecified: Secondary | ICD-10-CM | POA: Insufficient documentation

## 2011-05-29 NOTE — Procedures (Signed)
Modified Barium Swallow Procedure Note Patient Details  Name: [name] MRN: [id number] Date of Birth: Aug 17, 1919  Today's Date: 05/29/2011 Time: 8413-2440 Time Calculation (min): 51 min  Past Medical History: No past medical history on file. Past Surgical History: No past surgical history on file. HPI:  Symptoms/Limitations Symptoms: Pt essentially nonverbal (dementia), but smiles and follows simple commands. Special Tests: MBSS  General:  Date of Onset: 04/29/11 Type of Study: Initial MBS Diet Prior to this Study: Honey-thick liquids;Information not available Temperature Spikes Noted: No Respiratory Status: Room air History of Intubation: No Behavior/Cognition: Alert;Pleasant mood;Requires cueing Oral Cavity - Dentition: Adequate natural dentition Oral Motor / Sensory Function: Within functional limits Vision: Functional for self-feeding Patient Positioning: Upright in chair Baseline Vocal Quality: Low vocal intensity Volitional Cough: Weak Volitional Swallow: Unable to elicit Anatomy: Within functional limits Pharyngeal Secretions: Not observed secondary MBS  Reason for Referral:  Objectively evaluate swallow function due to concerns of possible aspiration, consider upgrade as appropriate.  Oral Phase Oral Preparation/Oral Phase Oral Phase: Impaired Oral - Solids Oral - Regular: Piecemeal swallowing;Delayed oral transit;Lingual/palatal residue Oral - Pill: Reduced posterior propulsion;Delayed oral transit Oral Phase - Comment Oral Phase - Comment: Mild oral phase deficits with delayed oral transit, lingual residue  Pharyngeal Phase  Pharyngeal Phase Pharyngeal Phase: Impaired Pharyngeal - Thin Pharyngeal - Thin Straw: Premature spillage to pyriform;Reduced airway/laryngeal closure;Penetration/Aspiration during swallow;Delayed swallow initiation;Lateral channel residue Penetration/Aspiration details (thin straw): Material enters airway, CONTACTS cords and  not ejected out;Material enters airway, remains ABOVE vocal cords then ejected out Pharyngeal - Solids Pharyngeal - Mechanical Soft: Delayed swallow initiation;Premature spillage to valleculae;Reduced tongue base retraction;Pharyngeal residue - valleculae (Liquid wash effective) Pharyngeal - Regular: Premature spillage to valleculae;Delayed swallow initiation;Pharyngeal residue - valleculae (absent swallow with cracker sitting in vallec. until liquid ) Pharyngeal - Pill: Delayed swallow initiation;Premature spillage to valleculae;Reduced epiglottic inversion;Trace aspiration;Penetration/Aspiration after swallow (pill lodged in valleculae and cleared with mult. swallows th) Penetration/Aspiration details (pill): Material enters airway, passes BELOW cords then ejected out (trace silent aspiration thin when taking pill, cued cough cl) Pharyngeal Phase - Comment Pharyngeal Comment: Delay in swallow initiation (absent with solids in valleculae, fills pyriforms with liquids), decreased tongue base retraction, pt had difficulty with mixed consistencies and straw (penetration and trace aspiration)  Cervical Esophageal Phase  Cervical Esophageal Phase Cervical Esophageal Phase: WFL (pt had one episode of UES opening and passing pyrif residue) Without an actual oral swallow  Recommendation/Prognosis  Clinical Impression Dysphagia Diagnosis: Mild oral phase dysphagia;Mild pharyngeal phase dysphagia Clinical impression: Overall mild to moderate sensorimotor based oropharyngeal  dysphagia characterized by prolonged oral prep, premature spillage to oropharynx,  delay in swallow initiation (absent with small bite regular texture), min decreased tongue base retraction, and decreased airway protection resulting in penetration and trace aspiration of thins by straw and when taking pill (mixed consistency) without benefit of spontaneous cough. The trace aspiration of thins occurred after the swallow in anterior  portion of tracheal wall and was removed with a cued cough. Recommendations Solid Consistency: Dysphagia 3 (Mechanical soft) Liquid Consistency: Thin;Nectar Liquid Administration via: Cup;No straw Medication Administration: Crushed with puree Supervision: Full supervision/cueing for compensatory strategies Compensations: Follow solids with liquid;Clear throat intermittently Postural Changes and/or Swallow Maneuvers: Seated upright 90 degrees;Upright 30-60 min after meal Oral Care Recommendations: Oral care before and after PO;Staff/trained caregiver to provide oral care Other Recommendations: Clarify dietary restrictions Prognosis Prognosis for Safe Diet Advancement: Fair Barriers to Reach Goals: Cognitive deficits Individuals Consulted Consulted and Agree  with Results and Recommendations: Patient unable/family or caregiver not available Report Sent to : Facility (Comment);Primary SLP [facility])  SLP Assessment/Plan:  Begin D3/mech soft and cup sips thin with supervision (no straws). If patient is drinking without supervision, recommend thickening to nectar consistency.  Havery Moros, CCC-SLP [phone]  Harjot Dibello 05/29/2011, 5:55 PM

## 2011-06-02 LAB — BASIC METABOLIC PANEL
BUN: 21
CO2: 29
Chloride: 102
Creatinine, Ser: 0.62
Glucose, Bld: 158 — ABNORMAL HIGH

## 2011-06-02 LAB — DIFFERENTIAL
Basophils Relative: 1
Eosinophils Absolute: 0.2
Eosinophils Relative: 3
Monocytes Relative: 7
Neutrophils Relative %: 71

## 2011-06-02 LAB — POCT CARDIAC MARKERS
Myoglobin, poc: 43.5
Operator id: 166561

## 2011-06-02 LAB — CBC
MCHC: 34.2
MCV: 89.7
Platelets: 187

## 2011-06-13 LAB — COMPREHENSIVE METABOLIC PANEL
AST: 18 U/L (ref 0–37)
BUN: 13 mg/dL (ref 6–23)
CO2: 30 mEq/L (ref 19–32)
Calcium: 8.8 mg/dL (ref 8.4–10.5)
Creatinine, Ser: 0.7 mg/dL (ref 0.4–1.2)
GFR calc Af Amer: >60 mL/min (ref >60)
GFR calc non Af Amer: >60 mL/min (ref >60)
Total Bilirubin: 0.6 mg/dL (ref 0.3–1.2)

## 2011-06-13 LAB — COMPREHENSIVE METABOLIC PANEL WITH GFR
ALT: 11 U/L (ref 0–35)
Albumin: 3.4 g/dL — ABNORMAL LOW (ref 3.5–5.2)
Alkaline Phosphatase: 62 U/L (ref 39–117)
Chloride: 105 meq/L (ref 96–112)
Glucose, Bld: 125 mg/dL — ABNORMAL HIGH (ref 70–99)
Potassium: 3.6 meq/L (ref 3.5–5.1)
Sodium: 140 meq/L (ref 135–145)
Total Protein: 6.2 g/dL (ref 6.0–8.3)

## 2011-06-13 LAB — URINE MICROSCOPIC-ADD ON

## 2011-06-13 LAB — POCT CARDIAC MARKERS
CKMB, poc: <1 ng/mL — ABNORMAL LOW (ref 1.0–8.0)
Myoglobin, poc: 60.3 ng/mL (ref 12–200)
Troponin i, poc: <0.05 ng/mL (ref 0.00–0.09)

## 2011-06-13 LAB — GLUCOSE, CAPILLARY: Glucose-Capillary: 128 mg/dL — ABNORMAL HIGH (ref 70–99)

## 2011-06-13 LAB — DIFFERENTIAL
Basophils Absolute: 0 10*3/uL (ref 0.0–0.1)
Basophils Relative: 1 % (ref 0–1)
Eosinophils Absolute: 0.3 K/uL (ref 0.0–0.7)
Eosinophils Relative: 5 % (ref 0–5)
Lymphocytes Relative: 25 % (ref 12–46)
Lymphs Abs: 1.6 10*3/uL (ref 0.7–4.0)
Monocytes Absolute: 0.6 K/uL (ref 0.1–1.0)
Monocytes Relative: 9 % (ref 3–12)
Neutro Abs: 3.8 10*3/uL (ref 1.7–7.7)
Neutrophils Relative %: 61 % (ref 43–77)

## 2011-06-13 LAB — BASIC METABOLIC PANEL
BUN: 11 mg/dL (ref 6–23)
BUN: 9 mg/dL (ref 6–23)
GFR calc Af Amer: >60 mL/min (ref >60)
GFR calc Af Amer: >60 mL/min (ref >60)
GFR calc non Af Amer: >60 mL/min (ref >60)
GFR calc non Af Amer: >60 mL/min (ref >60)
Potassium: 3.5 mEq/L (ref 3.5–5.1)
Potassium: 3.5 mEq/L (ref 3.5–5.1)
Sodium: 139 mEq/L (ref 135–145)

## 2011-06-13 LAB — CBC
HCT: 36.1 % (ref 36.0–46.0)
Hemoglobin: 12 g/dL (ref 12.0–15.0)
MCHC: 33.2 g/dL (ref 30.0–36.0)
MCV: 88.8 fL (ref 78.0–100.0)
Platelets: 151 K/uL (ref 150–400)
RBC: 4.06 MIL/uL (ref 3.87–5.11)
RDW: 14.7 % (ref 11.5–15.5)
WBC: 6.3 K/uL (ref 4.0–10.5)

## 2011-06-13 LAB — URINALYSIS, ROUTINE W REFLEX MICROSCOPIC
Bilirubin Urine: NEGATIVE
Bilirubin Urine: NEGATIVE
Glucose, UA: NEGATIVE mg/dL
Ketones, ur: NEGATIVE mg/dL
Nitrite: POSITIVE — AB
Protein, ur: NEGATIVE mg/dL
Specific Gravity, Urine: 1.02 (ref 1.005–1.030)
Specific Gravity, Urine: 1.025 (ref 1.005–1.030)
Urobilinogen, UA: 0.2 mg/dL (ref 0.0–1.0)
Urobilinogen, UA: 0.2 mg/dL (ref 0.0–1.0)
pH: 6.5 (ref 5.0–8.0)
pH: 7 (ref 5.0–8.0)

## 2011-06-13 LAB — URINE CULTURE
Culture: NO GROWTH
Special Requests: NEGATIVE

## 2011-06-13 LAB — APTT: aPTT: 29 seconds (ref 24–37)

## 2011-06-13 LAB — PROTIME-INR
INR: 1 (ref 0.00–1.49)
Prothrombin Time: 13.1 seconds (ref 11.6–15.2)

## 2011-06-13 LAB — TSH: TSH: 2.348 u[IU]/mL (ref 0.350–4.500)

## 2011-06-20 LAB — URINE MICROSCOPIC-ADD ON

## 2011-06-20 LAB — DIFFERENTIAL
Basophils Absolute: 0
Eosinophils Absolute: 0.1
Eosinophils Relative: 1

## 2011-06-20 LAB — CBC
RBC: 4.81
WBC: 8.1

## 2011-06-20 LAB — URINALYSIS, ROUTINE W REFLEX MICROSCOPIC
Bilirubin Urine: NEGATIVE
Nitrite: POSITIVE — AB
Specific Gravity, Urine: 1.025
pH: 7

## 2011-06-20 LAB — COMPREHENSIVE METABOLIC PANEL
ALT: 17
AST: 24
CO2: 29
Chloride: 101
GFR calc Af Amer: >60
GFR calc non Af Amer: >60
Sodium: 138
Total Bilirubin: 0.9

## 2011-06-20 LAB — LIPASE, BLOOD: Lipase: 19

## 2011-06-20 LAB — POCT CARDIAC MARKERS: Troponin i, poc: <0.05

## 2011-08-30 ENCOUNTER — Emergency Department (HOSPITAL_COMMUNITY): Payer: Medicare Other

## 2011-08-30 ENCOUNTER — Encounter: Payer: Self-pay | Admitting: *Deleted

## 2011-08-30 ENCOUNTER — Emergency Department (HOSPITAL_COMMUNITY)
Admission: EM | Admit: 2011-08-30 | Discharge: 2011-08-30 | Disposition: A | Discharge status: Nursing Home (Includes ICF) | Payer: Medicare Other | Attending: Emergency Medicine | Admitting: Emergency Medicine

## 2011-08-30 DIAGNOSIS — Y9210 Unspecified residential institution as the place of occurrence of the external cause: Secondary | ICD-10-CM | POA: Insufficient documentation

## 2011-08-30 DIAGNOSIS — Z7982 Long term (current) use of aspirin: Secondary | ICD-10-CM | POA: Insufficient documentation

## 2011-08-30 DIAGNOSIS — S51009A Unspecified open wound of unspecified elbow, initial encounter: Secondary | ICD-10-CM | POA: Insufficient documentation

## 2011-08-30 DIAGNOSIS — W19XXXA Unspecified fall, initial encounter: Secondary | ICD-10-CM

## 2011-08-30 DIAGNOSIS — I10 Essential (primary) hypertension: Secondary | ICD-10-CM | POA: Insufficient documentation

## 2011-08-30 DIAGNOSIS — W06XXXA Fall from bed, initial encounter: Secondary | ICD-10-CM | POA: Insufficient documentation

## 2011-08-30 DIAGNOSIS — M810 Age-related osteoporosis without current pathological fracture: Secondary | ICD-10-CM | POA: Insufficient documentation

## 2011-08-30 DIAGNOSIS — IMO0002 Reserved for concepts with insufficient information to code with codable children: Secondary | ICD-10-CM

## 2011-08-30 DIAGNOSIS — M5030 Other cervical disc degeneration, unspecified cervical region: Secondary | ICD-10-CM | POA: Insufficient documentation

## 2011-08-30 DIAGNOSIS — G319 Degenerative disease of nervous system, unspecified: Secondary | ICD-10-CM | POA: Insufficient documentation

## 2011-08-30 DIAGNOSIS — F0390 Unspecified dementia without behavioral disturbance: Secondary | ICD-10-CM | POA: Insufficient documentation

## 2011-08-30 HISTORY — DX: Essential (primary) hypertension: I10

## 2011-08-30 HISTORY — DX: Unspecified dementia, unspecified severity, without behavioral disturbance, psychotic disturbance, mood disturbance, and anxiety: F03.90

## 2011-08-30 HISTORY — DX: Cerebral infarction, unspecified: I63.9

## 2011-08-30 NOTE — ED Provider Notes (Signed)
History     CSN: [id number]  Arrival date & time 08/30/11  1217   First MD Initiated Contact with Patient 08/30/11 1258      Chief Complaint  Patient presents with  . Fall   Level V caveat because of dementia (Consider location/radiation/quality/duration/timing/severity/associated sxs/prior treatment) HPI  Son relates the patient's nursing home called him and told him that patient had rolled out of bed and she bumped her head. They did not describe loss of consciousness. He relates she also has a skin tear on her left elbow. He thinks the patient is acting her usual self. Patient is very slow to respond but denies being in any pain.  PCP Dr. Megan Mans  Past Medical History  Diagnosis Date  . CVA (cerebral vascular accident)   . Hypertension   . Dementia   . Osteoporosis     History reviewed. No pertinent past surgical history.  History reviewed. No pertinent family history.  History  Substance Use Topics  . Smoking status: Never Smoker   . Smokeless tobacco: Not on file  . Alcohol Use: No   lives in nursing home Walks with walker  OB History    Grav Para Term Preterm Abortions TAB SAB Ect Mult Living                  Review of Systems  Unable to perform ROS: Dementia    Allergies  Review of patient's allergies indicates no known allergies.  Home Medications   Current Outpatient Rx  Name Route Sig Dispense Refill  . ASPIRIN EC 81 MG PO TBEC Oral Take 81 mg by mouth daily.      Marland Kitchen CALCIUM CARBONATE-VITAMIN D 500-200 MG-UNIT PO TABS Oral Take 1 tablet by mouth 3 (three) times daily.      Marland Kitchen CLOPIDOGREL BISULFATE 75 MG PO TABS Oral Take 75 mg by mouth daily.      . DONEPEZIL HCL 10 MG PO TABS Oral Take 10 mg by mouth at bedtime.      Marland Kitchen MEMANTINE HCL 10 MG PO TABS Oral Take 10 mg by mouth 2 (two) times daily.      Marland Kitchen METOPROLOL SUCCINATE ER 25 MG PO TB24 Oral Take 25 mg by mouth daily.      Marland Kitchen RANITIDINE HCL 150 MG PO TABS Oral Take 150 mg by mouth 2 (two) times  daily.      Marland Kitchen VALSARTAN-HYDROCHLOROTHIAZIDE 80-12.5 MG PO TABS Oral Take 1 tablet by mouth daily.      Marland Kitchen CETIRIZINE-PSEUDOEPHEDRINE ER 5-120 MG PO TB12 Oral Take 1 tablet by mouth daily as needed. For allergies      . LOPERAMIDE HCL 2 MG PO CAPS Oral Take 2 mg by mouth as needed. Take 1 capsule after each loose stool (max of 8 capsules per day)     . LANTISEPTIC MULTI-PURPOSE EX Apply externally Apply 1 application topically as needed. As needed to the buttocks after each bathroom visit      . OFLOXACIN 0.3 % OP SOLN Ophthalmic Apply 1 drop to eye 4 (four) times daily as needed. Apply to affected eyes  for 7 days as needed for infection     . ONDANSETRON HCL 4 MG PO TABS Oral Take 4 mg by mouth every 8 (eight) hours as needed. For nausea and vomiting        BP 204/85  Pulse 63  Temp(Src) 98 F (36.7 C) (Oral)  Resp 20  SpO2 90% Vital signs hypertension otherwise normal  Physical Exam  Nursing note and vitals reviewed. Constitutional:  Non-toxic appearance. She does not appear ill. No distress.       Thin elderly white female on a backboard with C-spine precautions. Pt removed from backboard during my exam and C collar left in place.   HENT:  Head: Normocephalic and atraumatic.  Right Ear: External ear normal.  Left Ear: External ear normal.  Nose: Nose normal. No mucosal edema or rhinorrhea.  Mouth/Throat: Oropharynx is clear and moist and mucous membranes are normal. No dental abscesses or uvula swelling.  Eyes: Conjunctivae and EOM are normal. Pupils are equal, round, and reactive to light.       Patient has very intensely red rims of her eyelids the left worse than the right, there is no drainage seen  Neck: Full passive range of motion without pain.  Cardiovascular: Normal rate, regular rhythm and normal heart sounds.  Exam reveals no gallop and no friction rub.   No murmur heard. Pulmonary/Chest: Effort normal and breath sounds normal. No respiratory distress. She has no  wheezes. She has no rhonchi. She has no rales. She exhibits no tenderness and no crepitus.  Abdominal: Soft. Normal appearance and bowel sounds are normal. She exhibits no distension. There is no tenderness. There is no rebound and no guarding.  Musculoskeletal: Normal range of motion. She exhibits no edema and no tenderness.       Moves all extremities well. Dressing removed from her left elbow, patient has a small 1 cm flap type skin tear on the radial aspect of her elbow. There is no joint effusion noted, there is no active bleeding, she is noted to move the elbow well without apparent difficulty  Neurological: She is alert. She has normal strength. No cranial nerve deficit.       Patient is very slow to respond, she's not very verbal, she will answer simple yes or no questions.  Skin: Skin is warm, dry and intact. No rash noted. No erythema. No pallor.  Psychiatric: Her speech is normal. Her mood appears not anxious.       Patient smiles when taken off the backboard    ED Course  Procedures (including critical care time)  Patient on Plavix and hit her head. CT scan was done should not show any acute changes. Son relates she seems her usual self.   Dg Elbow Complete Left  08/30/2011  *RADIOLOGY REPORT*  Clinical Data: Larey Seat from bed.  LEFT ELBOW - COMPLETE 3+ VIEW  Comparison: None.  Findings: No  evidence of fracture or dislocation of the elbow.  No joint effusion.  IMPRESSION: No evidence of fracture.  Original Report Authenticated By: Genevive Bi, M.D.   Ct Head Wo Contrast Ct Cervical Spine Wo Contrast  08/30/2011  *RADIOLOGY REPORT*  Clinical Data:  Fall.  Hit head.  CT HEAD WITHOUT CONTRAST CT CERVICAL SPINE WITHOUT CONTRAST  Technique:  Multidetector CT imaging of the head and cervical spine was performed following the standard protocol without intravenous contrast.  Multiplanar CT image reconstructions of the cervical spine were also generated.  Comparison:  02/30/2011  CT HEAD   Findings: Chronic small vessel disease changes throughout the deep white matter.  Multiple remote lacunar infarcts throughout the white matter and basal ganglia, stable.  Mild atrophy. No acute intracranial abnormality.  Specifically, no hemorrhage, hydrocephalus, mass lesion, acute infarction, or significant intracranial injury.  No acute calvarial abnormality.  Mild mucosal thickening in the paranasal sinuses.  Mastoids are clear.  Old  right cerebellar infarct, stable.  IMPRESSION: No acute intracranial abnormality.  CT CERVICAL SPINE  Findings: Diffuse degenerative disc disease.  Is most pronounced at C5-6 and C6-7.  Diffuse bilateral facet disease.  No fracture.  No malalignment.  No epidural or paraspinal hematoma.  IMPRESSION: Spondylosis.  No acute findings.  Original Report Authenticated By: Cyndie Chime, M.D.    Diagnoses that have been ruled out:  Diagnoses that are still under consideration:  Final diagnoses:  Fall  Skin tear   Plan discharge  Devoria Albe, MD, FACEP   MDM          Ward Givens, MD 08/30/11 1500

## 2011-08-30 NOTE — ED Notes (Signed)
Pt arrived via ems d/t fall this am. Pt coming from [facility]. Pt reported to have rolled out of bed this am and hit her head.

## 2011-08-30 NOTE — ED Notes (Signed)
Contacted Kayla at [facility] to obtain transport for pt.

## 2011-11-19 ENCOUNTER — Encounter (HOSPITAL_COMMUNITY): Payer: Self-pay

## 2011-11-19 ENCOUNTER — Other Ambulatory Visit: Payer: Self-pay

## 2011-11-19 ENCOUNTER — Emergency Department (HOSPITAL_COMMUNITY)
Admission: EM | Admit: 2011-11-19 | Discharge: 2011-11-19 | Disposition: A | Discharge status: Home / Self Care | Payer: Medicare Other | Attending: Emergency Medicine | Admitting: Emergency Medicine

## 2011-11-19 DIAGNOSIS — I10 Essential (primary) hypertension: Secondary | ICD-10-CM | POA: Insufficient documentation

## 2011-11-19 DIAGNOSIS — N390 Urinary tract infection, site not specified: Secondary | ICD-10-CM

## 2011-11-19 DIAGNOSIS — F0390 Unspecified dementia without behavioral disturbance: Secondary | ICD-10-CM | POA: Insufficient documentation

## 2011-11-19 DIAGNOSIS — Z79899 Other long term (current) drug therapy: Secondary | ICD-10-CM | POA: Insufficient documentation

## 2011-11-19 DIAGNOSIS — E860 Dehydration: Secondary | ICD-10-CM

## 2011-11-19 DIAGNOSIS — R197 Diarrhea, unspecified: Secondary | ICD-10-CM | POA: Insufficient documentation

## 2011-11-19 DIAGNOSIS — R1110 Vomiting, unspecified: Secondary | ICD-10-CM | POA: Insufficient documentation

## 2011-11-19 DIAGNOSIS — R0682 Tachypnea, not elsewhere classified: Secondary | ICD-10-CM | POA: Insufficient documentation

## 2011-11-19 DIAGNOSIS — Z7982 Long term (current) use of aspirin: Secondary | ICD-10-CM | POA: Insufficient documentation

## 2011-11-19 DIAGNOSIS — Z8673 Personal history of transient ischemic attack (TIA), and cerebral infarction without residual deficits: Secondary | ICD-10-CM | POA: Insufficient documentation

## 2011-11-19 DIAGNOSIS — M810 Age-related osteoporosis without current pathological fracture: Secondary | ICD-10-CM | POA: Insufficient documentation

## 2011-11-19 DIAGNOSIS — H5789 Other specified disorders of eye and adnexa: Secondary | ICD-10-CM | POA: Insufficient documentation

## 2011-11-19 DIAGNOSIS — R4182 Altered mental status, unspecified: Secondary | ICD-10-CM | POA: Insufficient documentation

## 2011-11-19 LAB — DIFFERENTIAL
Basophils Absolute: 0 K/uL (ref 0.0–0.1)
Basophils Relative: 0 % (ref 0–1)
Eosinophils Absolute: 0 K/uL (ref 0.0–0.7)
Eosinophils Relative: 1 % (ref 0–5)
Lymphocytes Relative: 13 % (ref 12–46)
Lymphs Abs: 0.8 10*3/uL (ref 0.7–4.0)
Monocytes Absolute: 0.3 K/uL (ref 0.1–1.0)
Monocytes Relative: 6 % (ref 3–12)
Neutro Abs: 4.7 K/uL (ref 1.7–7.7)
Neutrophils Relative %: 81 % — ABNORMAL HIGH (ref 43–77)

## 2011-11-19 LAB — COMPREHENSIVE METABOLIC PANEL
ALT: 10 U/L (ref 0–35)
Alkaline Phosphatase: 64 U/L (ref 39–117)
CO2: 27 mEq/L (ref 19–32)
Calcium: 10.1 mg/dL (ref 8.4–10.5)
Chloride: 105 mEq/L (ref 96–112)
GFR calc Af Amer: 54 mL/min — ABNORMAL LOW (ref >90)
GFR calc non Af Amer: 47 mL/min — ABNORMAL LOW (ref >90)
Glucose, Bld: 125 mg/dL — ABNORMAL HIGH (ref 70–99)
Potassium: 3.7 mEq/L (ref 3.5–5.1)
Sodium: 142 mEq/L (ref 135–145)
Total Bilirubin: 0.3 mg/dL (ref 0.3–1.2)

## 2011-11-19 LAB — CBC
HCT: 36.5 % (ref 36.0–46.0)
Hemoglobin: 12.2 g/dL (ref 12.0–15.0)
MCH: 29.4 pg (ref 26.0–34.0)
MCHC: 33.4 g/dL (ref 30.0–36.0)
MCV: 88 fL (ref 78.0–100.0)
Platelets: 168 10*3/uL (ref 150–400)
RBC: 4.15 MIL/uL (ref 3.87–5.11)
RDW: 14.4 % (ref 11.5–15.5)
WBC: 5.8 10*3/uL (ref 4.0–10.5)

## 2011-11-19 LAB — URINALYSIS, ROUTINE W REFLEX MICROSCOPIC
Bilirubin Urine: NEGATIVE
Glucose, UA: NEGATIVE mg/dL
Ketones, ur: NEGATIVE mg/dL
Nitrite: POSITIVE — AB
Protein, ur: NEGATIVE mg/dL
Specific Gravity, Urine: >1.03 — ABNORMAL HIGH (ref 1.005–1.030)
Urobilinogen, UA: 0.2 mg/dL (ref 0.0–1.0)
pH: 5.5 (ref 5.0–8.0)

## 2011-11-19 LAB — URINE MICROSCOPIC-ADD ON

## 2011-11-19 LAB — COMPREHENSIVE METABOLIC PANEL WITH GFR
AST: 17 U/L (ref 0–37)
Albumin: 3.4 g/dL — ABNORMAL LOW (ref 3.5–5.2)
BUN: 45 mg/dL — ABNORMAL HIGH (ref 6–23)
Creatinine, Ser: 1.02 mg/dL (ref 0.50–1.10)
Total Protein: 7.1 g/dL (ref 6.0–8.3)

## 2011-11-19 MED ORDER — SODIUM CHLORIDE 0.9 % IV BOLUS (SEPSIS)
500.0000 mL | Freq: Once | INTRAVENOUS | Status: AC
  Administered 2011-11-19: 12:00:00 via INTRAVENOUS

## 2011-11-19 MED ORDER — CIPROFLOXACIN HCL 250 MG PO TABS
500.0000 mg | ORAL_TABLET | Freq: Once | ORAL | Status: AC
  Administered 2011-11-19: 500 mg via ORAL
  Filled 2011-11-19: qty 2

## 2011-11-19 MED ORDER — CIPROFLOXACIN HCL 500 MG PO TABS: 500.0000 mg | ORAL_TABLET | Freq: Once | ORAL | Status: AC

## 2011-11-19 NOTE — ED Notes (Signed)
Pt here from [facility] with n/v/d that started this morning

## 2011-11-19 NOTE — ED Notes (Signed)
[facility] to come and pick up patient.

## 2011-11-19 NOTE — ED Notes (Signed)
Pt showing NSR on cardiac monitor. Shakes her head to answer questions.

## 2011-11-19 NOTE — ED Notes (Signed)
In and out cath done for urine collection. Pt incontinent. Pt had on attends that was wet and patient changed. No skin breakdown noted.

## 2011-11-19 NOTE — Discharge Instructions (Signed)
Dehydration, Elderly  Dehydration is when you lose more fluids from the body than you take in. Vital organs such as the kidneys, brain, and heart cannot function without a proper amount of fluids and salt. Any loss of fluids from the body can cause dehydration.   Older adults are at a higher risk of dehydration than younger adults. As we age, our bodies are less able to conserve water and do not respond to temperature changes as well. Also, older adults do not become thirsty as easily or quickly. Because of this, older adults often do not realize they need to increase fluids to avoid dehydration.   CAUSES    Vomiting.   Diarrhea.   Excessive sweating.   Excessive urine output.   Fever.  SYMPTOMS   Mild dehydration   Thirst.   Dry lips.   Slightly dry mouth.  Moderate dehydration   Very dry mouth.   Sunken eyes.   Skin does not bounce back quickly when lightly pinched and released.   Dark urine and decreased urine production.   Decreased tear production.   Headache.  Severe dehydration   Very dry mouth.   Extreme thirst.   Rapid, weak pulse (more than 100 beats per minute at rest).   Cold hands and feet.   Not able to sweat in spite of heat.   Rapid breathing.   Blue lips.   Confusion and lethargy.   Difficulty being awakened.   Minimal urine production.   No tears.  DIAGNOSIS   Your caregiver will diagnose dehydration based on your symptoms and your exam. Blood and urine tests will help confirm the diagnosis. The diagnostic evaluation should also identify the cause of dehydration.  TREATMENT   Treatment of mild or moderate dehydration can often be done at home by increasing the amount of fluids that you drink. It is best to drink small amounts of fluid more often. Drinking too much at one time can make vomiting worse.   Severe dehydration needs to be treated at the hospital where you will probably be given intravenous (IV) fluids that contain water and electrolytes.  HOME CARE INSTRUCTIONS     Ask your caregiver about specific rehydration instructions.   Drink enough fluids to keep your urine clear or pale yellow.   Drink small amounts frequently if you have nausea and vomiting.   Eat as you normally do.   Avoid:   Foods or drinks high in sugar.   Carbonated drinks.   Juice.   Extremely hot or cold fluids.   Drinks with caffeine.   Fatty, greasy foods.   Alcohol.   Tobacco.   Overeating.   Gelatin desserts.   Wash your hands well to avoid spreading bacteria and viruses.   Only take over-the-counter or prescription medicines for pain, discomfort, or fever as directed by your caregiver.   Ask your caregiver if you should continue all prescribed and over-the-counter medicines.   Keep all follow-up appointments with your caregiver.  SEEK MEDICAL CARE IF:   You have abdominal pain and it increases or stays in one area (localizes).   You have a rash, stiff neck, or severe headache.   You are irritable, sleepy, or difficult to awaken.   You are weak, dizzy, or extremely thirsty.  SEEK IMMEDIATE MEDICAL CARE IF:    You are unable to keep fluids down, or you get worse despite treatment.   You have frequent episodes of vomiting or diarrhea.   You have blood or green   looks black and tarry.   You have not urinated in 6 to 8 hours, or you have only urinated a small amount of very dark urine.   You have a fever.   You faint.  MAKE SURE YOU:   Understand these instructions.   Will watch your condition.   Will get help right away if you are not doing well or get worse.  Document Released: 11/15/2003 Document Revised: 08/14/2011 Document Reviewed: 04/14/2011 [facility] Patient Information 2012 [place], [place].    Urinary Tract Infection Infections of the urinary tract can start in several places. A bladder infection (cystitis), a kidney  infection (pyelonephritis), and a prostate infection (prostatitis) are different types of urinary tract infections (UTIs). They usually get better if treated with medicines (antibiotics) that kill germs. Take all the medicine until it is gone. You or your child may feel better in a few days, but TAKE ALL MEDICINE or the infection may not respond and may become more difficult to treat. HOME CARE INSTRUCTIONS   Drink enough water and fluids to keep the urine clear or pale yellow. Cranberry juice is especially recommended, in addition to large amounts of water.   Avoid caffeine, tea, and carbonated beverages. They tend to irritate the bladder.   Alcohol may irritate the prostate.   Only take over-the-counter or prescription medicines for pain, discomfort, or fever as directed by your caregiver.  To prevent further infections:  Empty the bladder often. Avoid holding urine for long periods of time.   After a bowel movement, women should cleanse from front to back. Use each tissue only once.   Empty the bladder before and after sexual intercourse.  FINDING OUT THE RESULTS OF YOUR TEST Not all test results are available during your visit. If your or your child's test results are not back during the visit, make an appointment with your caregiver to find out the results. Do not assume everything is normal if you have not heard from your caregiver or the medical facility. It is important for you to follow up on all test results. SEEK MEDICAL CARE IF:   There is back pain.   Your baby is older than 3 months with a rectal temperature of 100.5 F (38.1 C) or higher for more than 1 day.   Your or your child's problems (symptoms) are no better in 3 days. Return sooner if you or your child is getting worse.  SEEK IMMEDIATE MEDICAL CARE IF:   There is severe back pain or lower abdominal pain.   You have a fever.   There is continued burning or discomfort with urination.  MAKE SURE YOU:    Understand these instructions.   Will watch your condition.   Will get help right away if you are not doing well or get worse.  Document Released: 06/04/2005 Document Revised: 08/14/2011 Document Reviewed: 01/07/2007 [facility] Patient Information 2012 [place], [place].

## 2011-11-19 NOTE — ED Notes (Signed)
Pt drank a cup of water. No vomiting noted. No further diarrhea.

## 2011-11-19 NOTE — ED Notes (Signed)
Pt given water to drink. Swallows without difficulty.

## 2011-11-19 NOTE — ED Notes (Signed)
Pt sent by [facility] for evaluation of sleep apnea. Pt recently diagnosed with Arna Medici virus per EMS. Pt alert but non verbal. Follows simple commands. Skin warm and dry. Color pink. Breath sounds clear and equal bilaterally. Redness noted left eye. Pt has IV infusing left antecubital space with no edema or redness. Pt has on attends with no stool noted. No active vomiting noted. Pt able to move all extremities. Redness noted on back of left heel.

## 2011-11-19 NOTE — ED Notes (Signed)
Pt smiling and shaking her head to answer questions. Pt showing NSR on cardiac monitor. Pt tolerated a half cup of water without difficulty.

## 2011-11-19 NOTE — ED Provider Notes (Signed)
History   This chart was scribed for [name]. [name] Lamas, MD by [name]. The patient was seen in room APA10/APA10. Patient's care was started at 1102.    CSN: [id number]  Arrival date & time 11/19/11  1102   First MD Initiated Contact with Patient 11/19/11 1114      Chief Complaint  Patient presents with  . Emesis    (Consider location/radiation/quality/duration/timing/severity/associated sxs/prior treatment) HPI Level 5 Caveat applies due to patient condition/altered mental status [name] is a 78 y.o. female who presents to the Emergency Department after being BIB EMS from [facility] to be evaluated for altered mental status and nausea and vomiting.   Additional history obtained after Consult completed with [employer] [facility] nursing staff at 11:38AM. Staff states that there are multiple people present with GI symptoms at [facility]. Clinically suspect Norovirus but condition has not been formally diagnosed. States patient had multiple episodes of vomiting and diarrhea yesterday but that symptoms have improved today although patient was unable to eat breakfast. Nursing staff also notes that patient had a 15 second episode of apnea when laid in bed this morning and was hypotensive at the time with a  Blood pressure measured at 90/40. Patient's baseline is dementia and is minimally communicative but able to make eye contact. Has a chronic condition of left eye.   Past Medical History  Diagnosis Date  . CVA (cerebral vascular accident)   . Hypertension   . Dementia   . Osteoporosis     History reviewed. No pertinent past surgical history.  History reviewed. No pertinent family history.  History  Substance Use Topics  . Smoking status: Never Smoker   . Smokeless tobacco: Not on file  . Alcohol Use: No    OB History    Grav Para Term Preterm Abortions TAB SAB Ect Mult Living                  Review of Systems  Unable to perform ROS: Other    Allergies    Review of patient's allergies indicates no known allergies.  Home Medications   Current Outpatient Rx  Name Route Sig Dispense Refill  . ASPIRIN EC 81 MG PO TBEC Oral Take 81 mg by mouth daily.      Marland Kitchen CALCIUM CARBONATE-VITAMIN D 500-200 MG-UNIT PO TABS Oral Take 1 tablet by mouth 3 (three) times daily.      Marland Kitchen CLOPIDOGREL BISULFATE 75 MG PO TABS Oral Take 75 mg by mouth daily.      . DONEPEZIL HCL 10 MG PO TABS Oral Take 10 mg by mouth at bedtime.      Marland Kitchen MEMANTINE HCL 10 MG PO TABS Oral Take 10 mg by mouth 2 (two) times daily.      Marland Kitchen METOPROLOL SUCCINATE ER 25 MG PO TB24 Oral Take 25 mg by mouth daily.      Marland Kitchen RANITIDINE HCL 150 MG PO TABS Oral Take 150 mg by mouth 2 (two) times daily.      Marland Kitchen VALSARTAN-HYDROCHLOROTHIAZIDE 80-12.5 MG PO TABS Oral Take 1 tablet by mouth daily.      Marland Kitchen CIPROFLOXACIN HCL 500 MG PO TABS Oral Take 1 tablet (500 mg total) by mouth once. 14 tablet 0    BP 154/52  Pulse 60  Temp(Src) 98.8 F (37.1 C) (Rectal)  Resp 17  SpO2 97%  Physical Exam  Nursing note and vitals reviewed. Constitutional: She appears well-developed and well-nourished. No distress.  Understands and follows commands but fairly listless. Easily arousable.   HENT:  Head: Normocephalic and atraumatic.       Mucous membranes dry.   Eyes: EOM are normal. Pupils are equal, round, and reactive to light.       Chronic appearing redness of left conjunctiva and bilateral eyelids. Cornea appears normal.   Neck: Neck supple. No tracheal deviation present.  Cardiovascular: Normal rate and regular rhythm.  Exam reveals no gallop and no friction rub.   No murmur heard. Pulmonary/Chest: Effort normal. No respiratory distress. She has no wheezes. She has no rales.       Mild tachypnea.   Abdominal: Soft. Bowel sounds are normal. She exhibits no distension. There is no tenderness.  Musculoskeletal: Normal range of motion. She exhibits no edema.  Neurological: She is alert. No sensory deficit.   Skin: Skin is warm and dry.  Psychiatric:       Minimally communicative.     ED Course  Procedures (including critical care time)  DIAGNOSTIC STUDIES: Oxygen Saturation is 93% on room air, adequate by my interpretation.    COORDINATION OF CARE: 11:34AM- Will consult with [facility] regarding patient.  11:38AM- Consult complete with [facility]. Multiple people present with GI symptoms at [facility]. Clinically suspect Norovirus but condition has not been formally diagnosed. States patient had multiple episodes of vomiting and diarrhea yesterday but that symptoms have improved today although patient was unable to eat breakfast. Nursing staff also notes that patient had a 15 second episode of apnea when laid in bed this morning and was hypotensive at the time with a  Blood pressure measured at 90/40. Patient's baseline is dementia and is minimally communicative but able to make eye contact. Has a chronic condition of left eye.   Labs Reviewed  DIFFERENTIAL - Abnormal; Notable for the following:    Neutrophils Relative 81 (*)    All other components within normal limits  COMPREHENSIVE METABOLIC PANEL - Abnormal; Notable for the following:    Glucose, Bld 125 (*)    BUN 45 (*)    Albumin 3.4 (*)    GFR calc non Af Amer 47 (*)    GFR calc Af Amer 54 (*)    All other components within normal limits  URINALYSIS, ROUTINE W REFLEX MICROSCOPIC - Abnormal; Notable for the following:    Specific Gravity, Urine >1.030 (*)    Hgb urine dipstick SMALL (*)    Nitrite POSITIVE (*)    Leukocytes, UA TRACE (*)    All other components within normal limits  URINE MICROSCOPIC-ADD ON - Abnormal; Notable for the following:    Bacteria, UA MANY (*)    Casts HYALINE CASTS (*)    All other components within normal limits  CBC  URINE CULTURE   No results found.   1. Dehydration   2. Urinary tract infection     EKG at time 1208, shows normal sinus rhythm at rate of 63. Left axis  deviation is noted. Left bundle branch block is present. PR interval is normal now compared to EKG performed 10/15/2009. Otherwise no significant changes noted.  1:35 PM Pt's BUN is at 45 with a Cr of 1 suggesting pre-renal dehydration.  Clinically, that is what I suspected caused pt's subtle change in mentation.  I doubt pt had true apnea of 15 seconds.  No arrythmia here, pt is at her baseline and continues to be so.  Will continue to hydrate with IVF's and will give PO  challenge.  If she can tolerate, I think pt can be discharged back to facility.  abd remains soft, no guard or rebound.  Evidence of UTI is also present.  Will add urine culture and give oral cipro.     2:25 PM Pt has drank fluids, remains pleasant, awake, arsouable.  Will d/c back to facility.    MDM   Patient likely with some sort of viral GI infection based on discussion with the patient's nurse at [facility]. Patient with low blood pressure and change in mentation which was described by the nurse as a period of apnea. Here she appears to be at her baseline mentation based on description by the nurse as well. She will awaken, make eye contact, will nod communicate verbally but does follow my simple commands such as opening her mouth and raising her arms. Here her blood pressure is normal but with a widened gap with a diastolic is low at 45. I suspect the patient is likely dehydrated given her vomiting and diarrhea. She does have a fever at this time. The nurse [facility] for me that they did have a stool sample that they were sending to the [facility] Department for further analysis. Here, my plan is to hydrate her with IV fluids, obtain basic routine electrolytes and CBC panels and continue to monitor. Patient is not diaphoretic in voice no complaints of chest discomfort. I do not suspect cardiac event as a primary etiology of her symptoms.      I personally performed the services described in this documentation,  which was scribed in my presence. The recorded information has been reviewed and considered.    [name]. Tenee Wish, MD 11/19/11 1425

## 2011-11-21 LAB — URINE CULTURE
Colony Count: >=100000
Culture  Setup Time: 201303132055

## 2011-11-22 NOTE — ED Notes (Signed)
+   Urine  Treated per protocol MD; Sensitive to same 

## 2012-09-29 ENCOUNTER — Emergency Department (HOSPITAL_COMMUNITY)
Admission: EM | Admit: 2012-09-29 | Discharge: 2012-09-29 | Disposition: A | Discharge status: Skilled Nursing Facility | Payer: Medicare PPO | Attending: Emergency Medicine | Admitting: Emergency Medicine

## 2012-09-29 ENCOUNTER — Encounter (HOSPITAL_COMMUNITY): Payer: Self-pay

## 2012-09-29 DIAGNOSIS — Z8673 Personal history of transient ischemic attack (TIA), and cerebral infarction without residual deficits: Secondary | ICD-10-CM | POA: Insufficient documentation

## 2012-09-29 DIAGNOSIS — Z7982 Long term (current) use of aspirin: Secondary | ICD-10-CM | POA: Insufficient documentation

## 2012-09-29 DIAGNOSIS — Z043 Encounter for examination and observation following other accident: Secondary | ICD-10-CM | POA: Insufficient documentation

## 2012-09-29 DIAGNOSIS — M810 Age-related osteoporosis without current pathological fracture: Secondary | ICD-10-CM | POA: Insufficient documentation

## 2012-09-29 DIAGNOSIS — I10 Essential (primary) hypertension: Secondary | ICD-10-CM | POA: Insufficient documentation

## 2012-09-29 DIAGNOSIS — Z79899 Other long term (current) drug therapy: Secondary | ICD-10-CM | POA: Insufficient documentation

## 2012-09-29 DIAGNOSIS — F0390 Unspecified dementia without behavioral disturbance: Secondary | ICD-10-CM

## 2012-09-29 DIAGNOSIS — W19XXXA Unspecified fall, initial encounter: Secondary | ICD-10-CM

## 2012-09-29 NOTE — ED Notes (Signed)
Pt removed from back board with spinal precautions. No obvious deformity or bruising noted in LE. Pt has bruising to L elbow, unknown onset. Pt on Plavix and low dose ASA.

## 2012-09-29 NOTE — ED Notes (Signed)
Pt from [facility]. Here for eva of fall. Fully immoblized

## 2012-09-29 NOTE — ED Notes (Signed)
Staff from [facility] to department to pick up pt.

## 2012-09-29 NOTE — ED Provider Notes (Signed)
History  This chart was scribed for [name] Hutching, MD by Shari Heritage, ED Scribe. The patient was seen in room APA18/APA18. Patient's care was started at 1907.   CSN: [id number]  Arrival date & time 09/29/12  1854   First MD Initiated Contact with Patient 09/29/12 1907      Chief Complaint  Patient presents with  . Fall    HPI Level Caveat - Full history could not be obtained due to patient's dementia. [name] is a 79 y.o. female here from [facility] after an unwitnessed fall. Per EMS, patient was found on the floor. She was transported here on backboard and with c-collar. There was no obvious deformity or bruising noted, except bruising to left elbow with unknown onset. Patient is on Plavix and baby aspirin.    Past Medical History  Diagnosis Date  . CVA (cerebral vascular accident)   . Hypertension   . Dementia   . Osteoporosis     History reviewed. No pertinent past surgical history.  No family history on file.  History  Substance Use Topics  . Smoking status: Never Smoker   . Smokeless tobacco: Not on file  . Alcohol Use: No    OB History    Grav Para Term Preterm Abortions TAB SAB Ect Mult Living                  Review of Systems Level 5 Caveat - Full ROS could not be obtained due to patient's dementia.  Allergies  Review of patient's allergies indicates no known allergies.  Home Medications   Current Outpatient Rx  Name  Route  Sig  Dispense  Refill  . ASPIRIN EC 81 MG PO TBEC   Oral   Take 81 mg by mouth daily.           Marland Kitchen CALCIUM CARBONATE-VITAMIN D 500-200 MG-UNIT PO TABS   Oral   Take 1 tablet by mouth 3 (three) times daily.           Marland Kitchen CLOPIDOGREL BISULFATE 75 MG PO TABS   Oral   Take 75 mg by mouth daily.           . DONEPEZIL HCL 10 MG PO TABS   Oral   Take 10 mg by mouth at bedtime.           Marland Kitchen MEMANTINE HCL 10 MG PO TABS   Oral   Take 10 mg by mouth 2 (two) times daily.           Marland Kitchen METOPROLOL SUCCINATE ER 25  MG PO TB24   Oral   Take 25 mg by mouth daily.           Marland Kitchen RANITIDINE HCL 150 MG PO TABS   Oral   Take 150 mg by mouth 2 (two) times daily.           Marland Kitchen VALSARTAN-HYDROCHLOROTHIAZIDE 80-12.5 MG PO TABS   Oral   Take 1 tablet by mouth daily.             Triage Vitals: BP 208/92  Pulse 87  Temp 97.8 F (36.6 C) (Oral)  Resp 22  SpO2 98%  Physical Exam  Nursing note and vitals reviewed. Constitutional: She is oriented to person, place, and time. She appears well-developed and well-nourished.  HENT:  Head: Normocephalic and atraumatic.  Eyes: Conjunctivae normal and EOM are normal. Pupils are equal, round, and reactive to light.  Neck: Normal range of motion. Neck supple.  Cardiovascular: Normal rate, regular rhythm and normal heart sounds.   Pulmonary/Chest: Effort normal and breath sounds normal.  Abdominal: Soft. Bowel sounds are normal.  Musculoskeletal: Normal range of motion.  Neurological: She is alert and oriented to person, place, and time.  Skin: Skin is warm and dry.  Psychiatric: She has a normal mood and affect.    ED Course  Procedures (including critical care time) DIAGNOSTIC STUDIES: Oxygen Saturation is 98% on room air, normal by my interpretation.    COORDINATION OF CARE: 7:41 PM- Patient is stable with no obvious injuries at this time. Do not think imaging is necessary at this time.      Labs Reviewed - No data to display No results found.   No diagnosis found.    MDM  No head or neck tenderness. Full range of motion of arms and legs.  Pelvis nontender      I personally performed the services described in this documentation, which was scribed in my presence. The recorded information has been reviewed and is accurate.    [name] Hutching, MD 09/29/12 2038

## 2012-09-29 NOTE — ED Notes (Signed)
[facility] contacted to pick up pt. MT reports transport cannot be sent for approx 20 more min.

## 2013-01-18 ENCOUNTER — Emergency Department (HOSPITAL_COMMUNITY): Payer: Medicare PPO

## 2013-01-18 ENCOUNTER — Encounter (HOSPITAL_COMMUNITY): Payer: Self-pay | Admitting: *Deleted

## 2013-01-18 ENCOUNTER — Inpatient Hospital Stay (HOSPITAL_COMMUNITY)
Admission: EM | Admit: 2013-01-18 | Discharge: 2013-01-24 | DRG: 280 | Disposition: A | Discharge status: Skilled Nursing Facility | Payer: Medicare PPO | Attending: Family Medicine | Admitting: Family Medicine

## 2013-01-18 DIAGNOSIS — I2510 Atherosclerotic heart disease of native coronary artery without angina pectoris: Secondary | ICD-10-CM | POA: Diagnosis present

## 2013-01-18 DIAGNOSIS — I639 Cerebral infarction, unspecified: Secondary | ICD-10-CM

## 2013-01-18 DIAGNOSIS — I5021 Acute systolic (congestive) heart failure: Principal | ICD-10-CM | POA: Diagnosis present

## 2013-01-18 DIAGNOSIS — I10 Essential (primary) hypertension: Secondary | ICD-10-CM | POA: Diagnosis present

## 2013-01-18 DIAGNOSIS — R0603 Acute respiratory distress: Secondary | ICD-10-CM

## 2013-01-18 DIAGNOSIS — R7989 Other specified abnormal findings of blood chemistry: Secondary | ICD-10-CM

## 2013-01-18 DIAGNOSIS — N179 Acute kidney failure, unspecified: Secondary | ICD-10-CM | POA: Diagnosis present

## 2013-01-18 DIAGNOSIS — R0902 Hypoxemia: Secondary | ICD-10-CM | POA: Diagnosis present

## 2013-01-18 DIAGNOSIS — Z8673 Personal history of transient ischemic attack (TIA), and cerebral infarction without residual deficits: Secondary | ICD-10-CM

## 2013-01-18 DIAGNOSIS — J9600 Acute respiratory failure, unspecified whether with hypoxia or hypercapnia: Secondary | ICD-10-CM | POA: Diagnosis present

## 2013-01-18 DIAGNOSIS — J984 Other disorders of lung: Secondary | ICD-10-CM

## 2013-01-18 DIAGNOSIS — F0390 Unspecified dementia without behavioral disturbance: Secondary | ICD-10-CM | POA: Diagnosis present

## 2013-01-18 DIAGNOSIS — J810 Acute pulmonary edema: Secondary | ICD-10-CM

## 2013-01-18 DIAGNOSIS — I214 Non-ST elevation (NSTEMI) myocardial infarction: Secondary | ICD-10-CM | POA: Diagnosis present

## 2013-01-18 DIAGNOSIS — M810 Age-related osteoporosis without current pathological fracture: Secondary | ICD-10-CM | POA: Diagnosis present

## 2013-01-18 DIAGNOSIS — I509 Heart failure, unspecified: Secondary | ICD-10-CM

## 2013-01-18 DIAGNOSIS — Z79899 Other long term (current) drug therapy: Secondary | ICD-10-CM

## 2013-01-18 DIAGNOSIS — Z66 Do not resuscitate: Secondary | ICD-10-CM | POA: Diagnosis present

## 2013-01-18 DIAGNOSIS — E876 Hypokalemia: Secondary | ICD-10-CM | POA: Diagnosis present

## 2013-01-18 DIAGNOSIS — I369 Nonrheumatic tricuspid valve disorder, unspecified: Secondary | ICD-10-CM

## 2013-01-18 DIAGNOSIS — D509 Iron deficiency anemia, unspecified: Secondary | ICD-10-CM | POA: Diagnosis present

## 2013-01-18 DIAGNOSIS — Z8249 Family history of ischemic heart disease and other diseases of the circulatory system: Secondary | ICD-10-CM

## 2013-01-18 LAB — CBC
HCT: 30.8 % — ABNORMAL LOW (ref 36.0–46.0)
MCH: 29.1 pg (ref 26.0–34.0)
MCV: 85.3 fL (ref 78.0–100.0)
Platelets: 223 10*3/uL (ref 150–400)
RBC: 3.61 MIL/uL — ABNORMAL LOW (ref 3.87–5.11)
RDW: 14.3 % (ref 11.5–15.5)

## 2013-01-18 LAB — BLOOD GAS, ARTERIAL
Expiratory PAP: 5
PEEP: 5 cmH2O
Patient temperature: 37
TCO2: 24.8 mmol/L (ref 0–100)
pCO2 arterial: 38.6 mmHg (ref 35.0–45.0)
pH, Arterial: 7.462 — ABNORMAL HIGH (ref 7.350–7.450)

## 2013-01-18 LAB — TROPONIN I
Troponin I: 1.81 ng/mL (ref <0.30)
Troponin I: 1.66 ng/mL (ref <0.30)

## 2013-01-18 LAB — POCT I-STAT, CHEM 8
BUN: 31 mg/dL — ABNORMAL HIGH (ref 6–23)
Calcium, Ion: 1.29 mmol/L (ref 1.13–1.30)
HCT: 30 % — ABNORMAL LOW (ref 36.0–46.0)
Hemoglobin: 10.2 g/dL — ABNORMAL LOW (ref 12.0–15.0)
Sodium: 142 mEq/L (ref 135–145)
TCO2: 29 mmol/L (ref 0–100)

## 2013-01-18 LAB — TSH: TSH: 1.223 u[IU]/mL (ref 0.350–4.500)

## 2013-01-18 LAB — HEPARIN LEVEL (UNFRACTIONATED): Heparin Unfractionated: 0.38 IU/mL (ref 0.30–0.70)

## 2013-01-18 LAB — POCT I-STAT TROPONIN I: Troponin i, poc: 0.71 ng/mL (ref 0.00–0.08)

## 2013-01-18 LAB — MRSA PCR SCREENING: MRSA by PCR: NEGATIVE

## 2013-01-18 MED ORDER — CALCIUM CARBONATE-VITAMIN D 500-200 MG-UNIT PO TABS
1.0000 | ORAL_TABLET | Freq: Three times a day (TID) | ORAL | Status: DC
  Administered 2013-01-18 – 2013-01-24 (×19): 1 via ORAL
  Filled 2013-01-18 (×22): qty 1

## 2013-01-18 MED ORDER — ONDANSETRON HCL 4 MG PO TABS: 4.0000 mg | ORAL_TABLET | Freq: Four times a day (QID) | ORAL | Status: DC | PRN

## 2013-01-18 MED ORDER — HYDROCHLOROTHIAZIDE 12.5 MG PO CAPS
12.5000 mg | ORAL_CAPSULE | Freq: Every day | ORAL | Status: DC
  Administered 2013-01-18 – 2013-01-19 (×2): 12.5 mg via ORAL
  Filled 2013-01-18 (×2): qty 1

## 2013-01-18 MED ORDER — ASPIRIN 300 MG RE SUPP
RECTAL | Status: AC
  Filled 2013-01-18: qty 1

## 2013-01-18 MED ORDER — ASPIRIN 300 MG RE SUPP
300.0000 mg | Freq: Once | RECTAL | Status: AC
  Administered 2013-01-18: 300 mg via RECTAL
  Filled 2013-01-18: qty 1

## 2013-01-18 MED ORDER — METOPROLOL SUCCINATE ER 25 MG PO TB24
25.0000 mg | ORAL_TABLET | Freq: Every day | ORAL | Status: DC
  Administered 2013-01-18 – 2013-01-24 (×7): 25 mg via ORAL
  Filled 2013-01-18 (×7): qty 1

## 2013-01-18 MED ORDER — FAMOTIDINE 20 MG PO TABS
20.0000 mg | ORAL_TABLET | Freq: Every day | ORAL | Status: DC
  Administered 2013-01-18 – 2013-01-24 (×7): 20 mg via ORAL
  Filled 2013-01-18 (×7): qty 1

## 2013-01-18 MED ORDER — CLOPIDOGREL BISULFATE 75 MG PO TABS
75.0000 mg | ORAL_TABLET | Freq: Every day | ORAL | Status: DC
  Administered 2013-01-18 – 2013-01-24 (×7): 75 mg via ORAL
  Filled 2013-01-18 (×7): qty 1

## 2013-01-18 MED ORDER — ASPIRIN 81 MG PO CHEW: 324.0000 mg | CHEWABLE_TABLET | Freq: Once | ORAL | Status: DC

## 2013-01-18 MED ORDER — FUROSEMIDE 10 MG/ML IJ SOLN
40.0000 mg | Freq: Once | INTRAMUSCULAR | Status: AC
  Administered 2013-01-18: 40 mg via INTRAVENOUS
  Filled 2013-01-18: qty 4

## 2013-01-18 MED ORDER — ONDANSETRON HCL 4 MG/2ML IJ SOLN: 4.0000 mg | Freq: Four times a day (QID) | INTRAMUSCULAR | Status: DC | PRN

## 2013-01-18 MED ORDER — MEMANTINE HCL 10 MG PO TABS
10.0000 mg | ORAL_TABLET | Freq: Two times a day (BID) | ORAL | Status: DC
  Administered 2013-01-18 – 2013-01-24 (×13): 10 mg via ORAL
  Filled 2013-01-18 (×13): qty 1

## 2013-01-18 MED ORDER — HEPARIN (PORCINE) IN NACL 100-0.45 UNIT/ML-% IJ SOLN
1000.0000 [IU]/h | INTRAMUSCULAR | Status: DC
  Administered 2013-01-18 – 2013-01-19 (×2): 1000 [IU]/h via INTRAVENOUS
  Filled 2013-01-18 (×2): qty 250

## 2013-01-18 MED ORDER — VALSARTAN-HYDROCHLOROTHIAZIDE 80-12.5 MG PO TABS: 1.0000 | ORAL_TABLET | Freq: Every day | ORAL | Status: DC

## 2013-01-18 MED ORDER — DONEPEZIL HCL 5 MG PO TABS
10.0000 mg | ORAL_TABLET | Freq: Every day | ORAL | Status: DC
  Administered 2013-01-18 – 2013-01-23 (×6): 10 mg via ORAL
  Filled 2013-01-18 (×6): qty 2

## 2013-01-18 MED ORDER — ASPIRIN EC 81 MG PO TBEC
81.0000 mg | DELAYED_RELEASE_TABLET | Freq: Every day | ORAL | Status: DC
  Administered 2013-01-18 – 2013-01-24 (×7): 81 mg via ORAL
  Filled 2013-01-18 (×7): qty 1

## 2013-01-18 MED ORDER — IRBESARTAN 75 MG PO TABS
75.0000 mg | ORAL_TABLET | Freq: Every day | ORAL | Status: DC
  Administered 2013-01-18 – 2013-01-24 (×7): 75 mg via ORAL
  Filled 2013-01-18 (×7): qty 1

## 2013-01-18 MED ORDER — SODIUM CHLORIDE 0.9 % IJ SOLN
3.0000 mL | Freq: Two times a day (BID) | INTRAMUSCULAR | Status: DC
  Administered 2013-01-18 – 2013-01-23 (×10): 3 mL via INTRAVENOUS
  Filled 2013-01-18: qty 3

## 2013-01-18 MED ORDER — FUROSEMIDE 10 MG/ML IJ SOLN
40.0000 mg | Freq: Two times a day (BID) | INTRAMUSCULAR | Status: DC
  Administered 2013-01-18 – 2013-01-20 (×5): 40 mg via INTRAVENOUS
  Filled 2013-01-18 (×5): qty 4

## 2013-01-18 NOTE — ED Notes (Signed)
Admitting physician at bedside, BiPap discontinued per verbal order.

## 2013-01-18 NOTE — H&P (Signed)
[facility] History and Physical  Dorie Ohms Jeane ZOX:[id number] DOB: May 23, 1919 DOA: 01/18/2013  Referring physician: Dr. Dierdre Highman, ER physician. PCP: Alice Reichert, MD    Chief Complaint: Dyspnea and respiratory distress.  HPI: [name] is a 79 y.o. female who cannot give any history secondary to her dementia which appears to be moderate to severe. She presented to the emergency room in respiratory distress. She was found to be in congestive heart failure and required BiPAP for a short while. She was given intravenous Lasix which is improved her condition and she has been able to be taken off the BiPAP. Initial troponin levels are elevated. ECG shows left bundle branch block, sinus rhythm. Apparently, according to paperwork, she is a full code.   Review of Systems:  Unable to be obtained from patient due to her dementia.  Past Medical History  Diagnosis Date  . CVA (cerebral vascular accident)   . Hypertension   . Dementia   . Osteoporosis    No past surgical history on file. Social History:  reports that she has never smoked. She does not have any smokeless tobacco history on file. She reports that she does not drink alcohol or use illicit drugs.  Lives at [facility]. She does not smoke. She does not drink alcohol. She is apparently able to feed herself. She is mobile in a wheelchair. She does need help with dressing and bathing. She is not very communicative.  No Known Allergies  No family history on file. noncontributory.   Prior to Admission medications   Medication Sig Start Date End Date Taking? Authorizing Provider  aspirin EC 81 MG tablet Take 81 mg by mouth daily.     Yes Historical Provider, MD  calcium-vitamin D (OSCAL WITH D) 500-200 MG-UNIT per tablet Take 1 tablet by mouth 3 (three) times daily.     Yes Historical Provider, MD  clopidogrel (PLAVIX) 75 MG tablet Take 75 mg by mouth daily.     Yes Historical Provider, MD  donepezil (ARICEPT) 10 MG  tablet Take 10 mg by mouth at bedtime.     Yes Historical Provider, MD  memantine (NAMENDA) 10 MG tablet Take 10 mg by mouth 2 (two) times daily.     Yes Historical Provider, MD  metoprolol succinate (TOPROL-XL) 25 MG 24 hr tablet Take 25 mg by mouth daily.     Yes Historical Provider, MD  ranitidine (ZANTAC) 150 MG tablet Take 150 mg by mouth 2 (two) times daily.     Yes Historical Provider, MD  valsartan-hydrochlorothiazide (DIOVAN-HCT) 80-12.5 MG per tablet Take 1 tablet by mouth daily.     Yes Historical Provider, MD   Physical Exam: Filed Vitals:   01/18/13 0710 01/18/13 0741 01/18/13 0746 01/18/13 0747  BP:      Pulse:  89 78 81  Temp:      TempSrc:      Resp:  25 18 17   SpO2: 99% 91% 97% 97%     General:  She does not appear to be in respiratory distress at the present time. There is no peripheral or central cyanosis. Her peripheries are somewhat cool.  Eyes: No pallor. No jaundice.  ENT: No major abnormalities.  Neck: No lymphadenopathy.  Cardiovascular: Heart sounds are present and are in sinus rhythm. Jugular venous pressure is not raised. There is no gallop rhythm. There are no murmurs.  Respiratory: Lung fields show inspiratory crackles in the mid and lower zones anteriorly. There is no bronchial breathing. There  is no wheezing.  Abdomen: Soft, nontender. No masses. No hepatosplenomegaly.  Skin: No rash.  Musculoskeletal: No acute joint abnormalities.  Psychiatric: Not examined.  Neurologic: Alert. No obvious focal neurological signs.  Labs on Admission:  Basic Metabolic Panel:  Recent Labs Lab 01/18/13 0630  NA 142  K 3.5  CL 106  GLUCOSE 140*  BUN 31*  CREATININE 1.00       CBC:  Recent Labs Lab 01/18/13 0621 01/18/13 0630  WBC 8.6  --   HGB 10.5* 10.2*  HCT 30.8* 30.0*  MCV 85.3  --   PLT 223  --    Cardiac Enzymes:  Recent Labs Lab 01/18/13 0614  TROPONINI 1.06*    BNP (last 3 results)  Recent Labs  01/18/13 0621   PROBNP 4525.0*      Radiological Exams on Admission: Dg Chest Portable 1 View  01/18/2013  *RADIOLOGY REPORT*  Clinical Data: Shortness of breath.  PORTABLE CHEST - 1 VIEW  Comparison: Chest 10/15/2009.  Findings: The patient has extensive bilateral airspace disease, worst in the left mid and upper lung zones and right upper lobe. Heart size is upper normal.  No pneumothorax or pleural effusion is identified.  There appears been old healed fracture of the proximal right humerus.  IMPRESSION: Left worse than right airspace disease has an appearance most in keeping with pneumonia rather than asymmetric edema.   Original Report Authenticated By: Holley Dexter, M.D.     EKG: Independently reviewed. Normal sinus rhythm, left bundle branch block.  Assessment/Plan Active Problems:   * No active hospital problems. *   1. Congestive heart failure. 2. Probable non-ST elevation MI. 3. Moderate to severe dementia. 4. History of cerebrovascular disease.  Plan: 1. Admit to step down unit for the time being. Remove BiPAP. 2. Intravenous Lasix for diuresis. 3. Serial cardiac enzymes. 4. Echocardiogram. 5. Intravenous heparin infusion for the time being. Further recommendations will depend on patient's hospital progress.  Code Status: DO NOT RESUSCITATE. This was confirmed by conversation with the son Shawnette Augello, who is the health care power of attorney.  Family Communication: Discussed plan with patient's son, Georgena Weisheit.   Disposition Plan: Depending on progress.  Time spent: 45 minutes.  Wilson Singer [facility] Pager [phone].  If 7PM-7AM, please contact night-coverage [email or web address] Password [facility] 01/18/2013, 7:59 AM

## 2013-01-18 NOTE — Progress Notes (Addendum)
ANTICOAGULATION CONSULT NOTE - Initial Consult  Pharmacy Consult for Heparin Indication: chest pain/ACS  No Known Allergies  Patient Measurements: Height: 5\' 6"  (167.6 cm) Weight: 137 lb 2 oz (62.2 kg) IBW/kg (Calculated) : 59.3  Vital Signs: Temp: 98.4 F (36.9 C) (05/13 0834) Temp src: Oral (05/13 0834) BP: 119/40 mmHg (05/13 1047) Pulse Rate: 98 (05/13 1047)  Labs:  Recent Labs  01/18/13 0614 01/18/13 0621 01/18/13 0630 01/18/13 0806  HGB  --  10.5* 10.2*  --   HCT  --  30.8* 30.0*  --   PLT  --  223  --   --   CREATININE  --   --  1.00  --   TROPONINI 1.06*  --   --  0.85*    Estimated Creatinine Clearance: 34.3 ml/min (by C-G formula based on Cr of 1).   Medical History: Past Medical History  Diagnosis Date  . CVA (cerebral vascular accident)   . Hypertension   . Dementia   . Osteoporosis     Medications:  Scheduled:  . aspirin EC  81 mg Oral Daily  . [COMPLETED] aspirin  300 mg Rectal Once  . calcium-vitamin D  1 tablet Oral TID  . clopidogrel  75 mg Oral Daily  . donepezil  10 mg Oral QHS  . famotidine  20 mg Oral Daily  . [COMPLETED] furosemide  40 mg Intravenous Once  . furosemide  40 mg Intravenous Q12H  . irbesartan  75 mg Oral Daily   And  . hydrochlorothiazide  12.5 mg Oral Daily  . memantine  10 mg Oral BID  . metoprolol succinate  25 mg Oral Daily  . sodium chloride  3 mL Intravenous Q12H  . [DISCONTINUED] aspirin  324 mg Oral Once  . [DISCONTINUED] valsartan-hydrochlorothiazide  1 tablet Oral Daily    Assessment: 79 yo demented F admitted from NH with NSTEMI.  She was started on heparin infusion at 1000 units/hr (16units/kg/hr) in ED.       CBC reviewed. No bleeding noted.   Goal of Therapy:  Heparin level 0.3-0.7 units/ml Monitor platelets by anticoagulation protocol: Yes   Plan:  1) Continue heparin infusion at 1000 units/hr 2) Check 8hr heparin level  3) Daily heparin level & CBC while on heparin 4) F/U plan- anticipate  48hrs of full-dose heparin for medical mgmt of NSTEMI  Donivan Thammavong, [facility] 01/18/2013,10:54 AM  Initial heparin level therapeutic.  Continue current rate.  Will follow-up in am.  Junita Push, PharmD, BCPS 01/18/2013@4 :24 PM

## 2013-01-18 NOTE — ED Provider Notes (Signed)
History     CSN: [id number]  Arrival date & time 01/18/13  4098   None     Chief Complaint  Patient presents with  . Shortness of Breath    (Consider location/radiation/quality/duration/timing/severity/associated sxs/prior treatment) HPI History provided by EMS and nursing home - resides in extended care facility with history of stroke, hypertension and dementia. Staff reported worsening shortness of breath over last hour tonight,  EMS was called and patient was started on CPAP for hypoxia in the 80s and rales throughout.  Patient does not provide any significant history secondary to dementia and condition. Level V caveat applies  Past Medical History  Diagnosis Date  . CVA (cerebral vascular accident)   . Hypertension   . Dementia   . Osteoporosis     No past surgical history on file.  No family history on file.  History  Substance Use Topics  . Smoking status: Never Smoker   . Smokeless tobacco: Not on file  . Alcohol Use: No    OB History   Grav Para Term Preterm Abortions TAB SAB Ect Mult Living                  Review of Systems  Unable to perform ROS  level V caveat as above   Allergies  Review of patient's allergies indicates no known allergies.  Home Medications   Current Outpatient Rx  Name  Route  Sig  Dispense  Refill  . aspirin EC 81 MG tablet   Oral   Take 81 mg by mouth daily.           . calcium-vitamin D (OSCAL WITH D) 500-200 MG-UNIT per tablet   Oral   Take 1 tablet by mouth 3 (three) times daily.           . clopidogrel (PLAVIX) 75 MG tablet   Oral   Take 75 mg by mouth daily.           Marland Kitchen donepezil (ARICEPT) 10 MG tablet   Oral   Take 10 mg by mouth at bedtime.           . memantine (NAMENDA) 10 MG tablet   Oral   Take 10 mg by mouth 2 (two) times daily.           . metoprolol succinate (TOPROL-XL) 25 MG 24 hr tablet   Oral   Take 25 mg by mouth daily.           . ranitidine (ZANTAC) 150 MG tablet   Oral  Take 150 mg by mouth 2 (two) times daily.           . valsartan-hydrochlorothiazide (DIOVAN-HCT) 80-12.5 MG per tablet   Oral   Take 1 tablet by mouth daily.             There were no vitals taken for this visit.  Physical Exam  Constitutional: She appears well-developed and well-nourished.  HENT:  Head: Normocephalic and atraumatic.  Eyes: EOM are normal. Pupils are equal, round, and reactive to light. No scleral icterus.  Neck: Neck supple. No tracheal deviation present.  Cardiovascular: Normal rate, regular rhythm and intact distal pulses.   Pulmonary/Chest: No stridor.  Tachypnea with mild respiratory distress and rales bilateraly posterior  Abdominal: Soft. Bowel sounds are normal. She exhibits no distension.  Musculoskeletal: Normal range of motion.  Trace pretibial edema bilateral lower extremities  Neurological:  Awake and alert and following commands  Skin: Skin is warm and dry.  ED Course  Procedures (including critical care time)  Results for orders placed during the hospital encounter of 01/18/13  MRSA PCR SCREENING      Result Value Range   MRSA by PCR NEGATIVE  NEGATIVE  CBC      Result Value Range   WBC 8.6  4.0 - 10.5 K/uL   RBC 3.61 (*) 3.87 - 5.11 MIL/uL   Hemoglobin 10.5 (*) 12.0 - 15.0 g/dL   HCT 16.1 (*) 09.6 - 04.5 %   MCV 85.3  78.0 - 100.0 fL   MCH 29.1  26.0 - 34.0 pg   MCHC 34.1  30.0 - 36.0 g/dL   RDW 40.9  81.1 - 91.4 %   Platelets 223  150 - 400 K/uL  PRO B NATRIURETIC PEPTIDE      Result Value Range   Pro B Natriuretic peptide (BNP) 4525.0 (*) 0 - 450 pg/mL  BLOOD GAS, ARTERIAL      Result Value Range   FIO2 50.00     Delivery systems BILEVEL POSITIVE AIRWAY PRESSURE     Peep/cpap 5.0     Inspiratory PAP 15     Expiratory PAP 5     pH, Arterial 7.462 (*) 7.350 - 7.450   pCO2 arterial 38.6  35.0 - 45.0 mmHg   pO2, Arterial 120.0 (*) 80.0 - 100.0 mmHg   Bicarbonate 27.2 (*) 20.0 - 24.0 mEq/L   TCO2 24.8  0 - 100 mmol/L    Acid-Base Excess 3.6 (*) 0.0 - 2.0 mmol/L   O2 Saturation 98.6     Patient temperature 37.0     Collection site RIGHT RADIAL     Drawn by [id number]     Sample type ARTERIAL     Allens test (pass/fail) PASS  PASS  TROPONIN I      Result Value Range   Troponin I 1.06 (*) <0.30 ng/mL  TROPONIN I      Result Value Range   Troponin I 0.85 (*) <0.30 ng/mL  TSH      Result Value Range   TSH 1.223  0.350 - 4.500 uIU/mL  HEPARIN LEVEL (UNFRACTIONATED)      Result Value Range   Heparin Unfractionated 0.38  0.30 - 0.70 IU/mL  TROPONIN I      Result Value Range   Troponin I 1.81 (*) <0.30 ng/mL  TROPONIN I      Result Value Range   Troponin I 1.66 (*) <0.30 ng/mL  CBC      Result Value Range   WBC 10.6 (*) 4.0 - 10.5 K/uL   RBC 3.54 (*) 3.87 - 5.11 MIL/uL   Hemoglobin 10.0 (*) 12.0 - 15.0 g/dL   HCT 78.2 (*) 95.6 - 21.3 %   MCV 85.9  78.0 - 100.0 fL   MCH 28.2  26.0 - 34.0 pg   MCHC 32.9  30.0 - 36.0 g/dL   RDW 08.6  57.8 - 46.9 %   Platelets 259  150 - 400 K/uL  HEPARIN LEVEL (UNFRACTIONATED)      Result Value Range   Heparin Unfractionated 0.33  0.30 - 0.70 IU/mL  COMPREHENSIVE METABOLIC PANEL      Result Value Range   Sodium 140  135 - 145 mEq/L   Potassium 3.0 (*) 3.5 - 5.1 mEq/L   Chloride 96  96 - 112 mEq/L   CO2 32  19 - 32 mEq/L   Glucose, Bld 124 (*) 70 - 99 mg/dL   BUN 32 (*) 6 -  23 mg/dL   Creatinine, Ser 4.09  0.50 - 1.10 mg/dL   Calcium 81.1  8.4 - 91.4 mg/dL   Total Protein 7.1  6.0 - 8.3 g/dL   Albumin 2.7 (*) 3.5 - 5.2 g/dL   AST 16  0 - 37 U/L   ALT 9  0 - 35 U/L   Alkaline Phosphatase 66  39 - 117 U/L   Total Bilirubin 0.6  0.3 - 1.2 mg/dL   GFR calc non Af Amer 47 (*) >90 mL/min   GFR calc Af Amer 54 (*) >90 mL/min  POCT I-STAT, CHEM 8      Result Value Range   Sodium 142  135 - 145 mEq/L   Potassium 3.5  3.5 - 5.1 mEq/L   Chloride 106  96 - 112 mEq/L   BUN 31 (*) 6 - 23 mg/dL   Creatinine, Ser 7.82  0.50 - 1.10 mg/dL   Glucose, Bld 956 (*) 70 - 99  mg/dL   Calcium, Ion 2.13  0.86 - 1.30 mmol/L   TCO2 29  0 - 100 mmol/L   Hemoglobin 10.2 (*) 12.0 - 15.0 g/dL   HCT 57.8 (*) 46.9 - 62.9 %  POCT I-STAT TROPONIN I      Result Value Range   Troponin i, poc 0.71 (*) 0.00 - 0.08 ng/mL   Comment NOTIFIED PHYSICIAN     Comment 3            Dg Chest Portable 1 View  01/18/2013   *RADIOLOGY REPORT*  Clinical Data: Shortness of breath.  PORTABLE CHEST - 1 VIEW  Comparison: Chest 10/15/2009.  Findings: The patient has extensive bilateral airspace disease, worst in the left mid and upper lung zones and right upper lobe. Heart size is upper normal.  No pneumothorax or pleural effusion is identified.  There appears been old healed fracture of the proximal right humerus.  IMPRESSION: Left worse than right airspace disease has an appearance most in keeping with pneumonia rather than asymmetric edema.   Original Report Authenticated By: Holley Dexter, M.D.       Date: 01/18/2013  Rate: 84  Rhythm: normal sinus rhythm  QRS Axis: left  Intervals: normal  ST/T Wave abnormalities: nonspecific ST/T changes  Conduction Disutrbances:left bundle branch block  Narrative Interpretation:   Old EKG Reviewed: unchanged  CRITICAL CARE Performed by: Mohid Furuya Total critical care time: 30 Critical care time was exclusive of separately billable procedures and treating other patients. Critical care was necessary to treat or prevent imminent or life-threatening deterioration. Critical care was time spent personally by me on the following activities: development of treatment plan with patient and/or surrogate as well as nursing, discussions with consultants, evaluation of patient's response to treatment, examination of patient, obtaining history from patient or surrogate, ordering and performing treatments and interventions, ordering and review of laboratory studies, ordering and review of radiographic studies, pulse oximetry and re-evaluation of patient's  condition.  ASA rectal, BiPAP, CXR, labs, ECG - old LBBB.  6:54 AM d/w PCP Dr Renard Matter who knows PT, he feels with clinical CHF, dementia andleevbated troponin would be appropriate to admitted at [facility] versus [facility]. hospitalist paged for admit MDM  Resp distress, hypoxia, pulm edema and elevated troponin  BiPAP, labs, ECG and imaging all reviewed as above  Plan MED admit      Sunnie Nielsen, MD 01/19/13 [phone]

## 2013-01-18 NOTE — Progress Notes (Signed)
*  PRELIMINARY RESULTS* Echocardiogram 2D Echocardiogram has been performed.  Conrad Iowa 01/18/2013, 10:19 AM

## 2013-01-18 NOTE — ED Notes (Signed)
CRITICAL VALUE ALERT  Critical value received:  Troponin I - 1.06  Date of notification:  01/18/2013  Time of notification:  0709  Critical value read back:yes  Nurse who received alert:  Hardie Pulley, RN  MD notified (1st page):  Dierdre Highman  Time of first page:  0709  MD notified (2nd page):  Time of second page:  Responding MD:  Dierdre Highman  Time MD responded:  [phone]

## 2013-01-18 NOTE — ED Notes (Signed)
Pt arrived from [facility] by EMS on cpap. Reported pt had been SOB for 1 hour.

## 2013-01-18 NOTE — Progress Notes (Signed)
UR Chart Review Completed  

## 2013-01-18 NOTE — ED Notes (Signed)
Placed on 2 liters O2 via [place] with O2 sats 96% currently

## 2013-01-18 NOTE — ED Notes (Signed)
Patient from [facility] complaining of shortness of breath. Oxygen saturation in 70s on EMS arrival. Patient on cpap from ems on arrival.

## 2013-01-18 NOTE — ED Notes (Signed)
Pt left with me at change of shift waiting to be admitted to hospitalist service. 07:31 Dr Karilyn Cota called back, has accepted patient for admission to step down.     Devoria Albe, MD, [facility] Gang   Ward Givens, MD 01/18/13 [phone]

## 2013-01-18 NOTE — ED Notes (Signed)
Patient is non verbal, unable to answer questions, is a resident of [address].

## 2013-01-19 ENCOUNTER — Encounter (HOSPITAL_COMMUNITY): Payer: Self-pay | Admitting: Adult Health

## 2013-01-19 DIAGNOSIS — I214 Non-ST elevation (NSTEMI) myocardial infarction: Secondary | ICD-10-CM

## 2013-01-19 DIAGNOSIS — I509 Heart failure, unspecified: Secondary | ICD-10-CM

## 2013-01-19 DIAGNOSIS — I5021 Acute systolic (congestive) heart failure: Principal | ICD-10-CM

## 2013-01-19 DIAGNOSIS — I639 Cerebral infarction, unspecified: Secondary | ICD-10-CM

## 2013-01-19 DIAGNOSIS — F0390 Unspecified dementia without behavioral disturbance: Secondary | ICD-10-CM

## 2013-01-19 DIAGNOSIS — I10 Essential (primary) hypertension: Secondary | ICD-10-CM

## 2013-01-19 LAB — VITAMIN B12: Vitamin B-12: 298 pg/mL (ref 211–911)

## 2013-01-19 LAB — RETICULOCYTES
RBC.: 3.68 MIL/uL — ABNORMAL LOW (ref 3.87–5.11)
Retic Count, Absolute: 47.8 10*3/uL (ref 19.0–186.0)
Retic Ct Pct: 1.3 % (ref 0.4–3.1)

## 2013-01-19 LAB — HEPARIN LEVEL (UNFRACTIONATED): Heparin Unfractionated: 0.33 IU/mL (ref 0.30–0.70)

## 2013-01-19 LAB — COMPREHENSIVE METABOLIC PANEL
ALT: 9 U/L (ref 0–35)
Albumin: 2.7 g/dL — ABNORMAL LOW (ref 3.5–5.2)
Calcium: 10.3 mg/dL (ref 8.4–10.5)
GFR calc Af Amer: 54 mL/min — ABNORMAL LOW (ref >90)
Glucose, Bld: 124 mg/dL — ABNORMAL HIGH (ref 70–99)
Sodium: 140 mEq/L (ref 135–145)
Total Protein: 7.1 g/dL (ref 6.0–8.3)

## 2013-01-19 LAB — CBC
MCHC: 32.9 g/dL (ref 30.0–36.0)
Platelets: 259 10*3/uL (ref 150–400)
RDW: 14.2 % (ref 11.5–15.5)
WBC: 10.6 10*3/uL — ABNORMAL HIGH (ref 4.0–10.5)

## 2013-01-19 LAB — IRON AND TIBC: Saturation Ratios: 11 % — ABNORMAL LOW (ref 20–55)

## 2013-01-19 LAB — FOLATE: Folate: 14 ng/mL

## 2013-01-19 MED ORDER — POTASSIUM CHLORIDE 10 MEQ/100ML IV SOLN
10.0000 meq | INTRAVENOUS | Status: AC
  Administered 2013-01-19 (×2): 10 meq via INTRAVENOUS
  Filled 2013-01-19: qty 200

## 2013-01-19 MED ORDER — POTASSIUM CHLORIDE CRYS ER 20 MEQ PO TBCR
40.0000 meq | EXTENDED_RELEASE_TABLET | Freq: Three times a day (TID) | ORAL | Status: AC
Start: 2013-01-19 — End: 2013-01-19
  Administered 2013-01-19 (×2): 40 meq via ORAL
  Filled 2013-01-19 (×2): qty 2

## 2013-01-19 NOTE — Progress Notes (Signed)
Hypokalemia   K replaced  

## 2013-01-19 NOTE — Progress Notes (Signed)
ANTICOAGULATION CONSULT NOTE   Pharmacy Consult for Heparin Indication: chest pain/ACS  No Known Allergies  Patient Measurements: Height: 5\' 6"  (167.6 cm) Weight: 133 lb 13.1 oz (60.7 kg) IBW/kg (Calculated) : 59.3  Vital Signs: Temp: 98.6 F (37 C) (05/14 0435) BP: 113/58 mmHg (05/14 0500) Pulse Rate: 73 (05/14 0600)  Labs:  Recent Labs  01/18/13 0621 01/18/13 0630 01/18/13 0806 01/18/13 1432 01/18/13 1433 01/18/13 1958 01/19/13 0442  HGB 10.5* 10.2*  --   --   --   --  10.0*  HCT 30.8* 30.0*  --   --   --   --  30.4*  PLT 223  --   --   --   --   --  259  HEPARINUNFRC  --   --   --   --  0.38  --  0.33  CREATININE  --  1.00  --   --   --   --  1.02  TROPONINI  --   --  0.85* 1.81*  --  1.66*  --     Estimated Creatinine Clearance: 33.6 ml/min (by C-G formula based on Cr of 1.02).  Medical History: Past Medical History  Diagnosis Date  . CVA (cerebral vascular accident)   . Hypertension   . Dementia   . Osteoporosis    Medications:  Scheduled:  . aspirin EC  81 mg Oral Daily  . calcium-vitamin D  1 tablet Oral TID  . clopidogrel  75 mg Oral Daily  . donepezil  10 mg Oral QHS  . famotidine  20 mg Oral Daily  . furosemide  40 mg Intravenous Q12H  . irbesartan  75 mg Oral Daily   And  . hydrochlorothiazide  12.5 mg Oral Daily  . memantine  10 mg Oral BID  . metoprolol succinate  25 mg Oral Daily  . potassium chloride  10 mEq Intravenous Q1 Hr x 2  . sodium chloride  3 mL Intravenous Q12H   Assessment: 79 yo demented F admitted from NH with NSTEMI.  She was started on heparin infusion at 1000 units/hr (16units/kg/hr) in ED.       CBC reviewed. No bleeding noted. Heparin level is therapeutic x 2.  Goal of Therapy:  Heparin level 0.3-0.7 units/ml Monitor platelets by anticoagulation protocol: Yes   Plan:  1) Continue heparin infusion at 1000 units/hr 2) Check heparin level daily 3) Daily heparin level & CBC while on heparin 4) F/U plan- anticipate  48hrs of full-dose heparin for medical mgmt of NSTEMI  Valrie Hart A 01/19/2013,8:10 AM

## 2013-01-19 NOTE — Progress Notes (Signed)
INITIAL NUTRITION ASSESSMENT  DOCUMENTATION CODES Per approved criteria  -Not Applicable   INTERVENTION: Magic cup TID between meals, each supplement provides 290 kcal and 9 grams of protein.  NUTRITION DIAGNOSIS: Decreased sodium, fluid needs r/t  Heart failure AEB Acute Systolic CHF  Goal: Pt to meet >/= 90% of their estimated nutrition needs  Monitor:  Tolerance and percentage of po intake, labs, I/O's and wt trends  Reason for Assessment: Malnutrition Screen=2  79 y.o. female  Admitting Dx: Congestive Heart Failure  ASSESSMENT: Pt from [place]. She is unable to provide details of nutrition hx secondary to dementia. Diet advanced to Puree (Heart Healthy) with Nectar-thick liquids. IV-Lasix (output noted).   Height: Ht Readings from Last 1 Encounters:  01/18/13 5\' 6"  (1.676 m)    Weight: Wt Readings from Last 1 Encounters:  01/19/13 133 lb 13.1 oz (60.7 kg)    Ideal Body Weight: 130# (59 kg)  % Ideal Body Weight: 103%  Wt Readings from Last 10 Encounters:  01/19/13 133 lb 13.1 oz (60.7 kg)    Usual Body Weight: unknown at this time (pt unable to provide no family are present)  BMI:  Body mass index is 21.61 kg/(m^2).Normal range  Estimated Nutritional Needs: Kcal: 1350-1575 Protein: 60-70 gr Fluid: per MD goals  Skin: No issues noted  Diet Order: Cardiac  EDUCATION NEEDS: -Education not appropriate at this time   Intake/Output Summary (Last 24 hours) at 01/19/13 1417 Last data filed at 01/19/13 1341  Gross per 24 hour  Intake    840 ml  Output   2900 ml  Net  -2060 ml    Last BM: 01/18/13  Labs:   Recent Labs Lab 01/18/13 0630 01/19/13 0442  NA 142 140  K 3.5 3.0*  CL 106 96  CO2  --  32  BUN 31* 32*  CREATININE 1.00 1.02  CALCIUM  --  10.3  GLUCOSE 140* 124*    CBG (last 3)  No results found for this basename: GLUCAP,  in the last 72 hours  Scheduled Meds: . aspirin EC  81 mg Oral Daily  . calcium-vitamin D  1  tablet Oral TID  . clopidogrel  75 mg Oral Daily  . donepezil  10 mg Oral QHS  . famotidine  20 mg Oral Daily  . furosemide  40 mg Intravenous Q12H  . irbesartan  75 mg Oral Daily  . memantine  10 mg Oral BID  . metoprolol succinate  25 mg Oral Daily  . sodium chloride  3 mL Intravenous Q12H    Continuous Infusions:   Past Medical History  Diagnosis Date  . CVA (cerebral vascular accident)   . Hypertension   . Dementia   . Osteoporosis     History reviewed. No pertinent past surgical history.  Royann Shivers MS,RD,LDN,CSG Office: [phone] Pager: [phone]

## 2013-01-19 NOTE — Consult Note (Signed)
CARDIOLOGY CONSULT NOTE  Patient ID: PUNAM BROUSSARD MRN: [id number] DOB/AGE: 1919-06-10 79 y.o.  Admit date: 01/18/2013 Referring Physician: Butch Penny Primary PhysicianMCINNIS,ANGUS G, MD Primary Cardiologist: [place] Bing MD Reason for Consultation: CHF Active Problems:   CHF (congestive heart failure)   Essential hypertension   Dementia   CVA (cerebral vascular accident)  HPI:  Mrs. Maslin is a 79 year old female resident of [address], admitted with acute respiratory distress, systolic CHF, pro BNP of 4525, requiring BiPAP and Lasix. The patient has dementia, is pleasantly confused, and history is obtained from past and present medical records. The patient has had no prior cardiac history. Family member at bedside, her son, is able to give some information, but his sister who is unavailable at this time has complete history. Attempts to contact her are ongoing.  On arrival to the emergency room the patient's blood pressure was 127/74 heart rate 84 with an O2 sat of 100%. She had a positive I stat troponin of  0.85, with followup troponin 1.06 ,mild anemia with a hemoglobin of 10.5 hematocrit 30.8, chest x-ray demonstrating pneumonia rather than asymmetric edema. She was given aspirin, Lasix 40 mg IV, and placed on heparin infusion in the setting of positive cardiac enzymes. The patient was unable to express whether she was having chest pain. EKG revealed normal sinus rhythm with a left bundle branch block which is unchanged from prior EKG in February 2013.  Review of systems complete and found to be negative unless listed above   Past Medical History  Diagnosis Date  . CVA (cerebral vascular accident)   . Hypertension   . Dementia   . Osteoporosis     Family History  Problem Relation Age of Onset  . Heart failure Sister   . Heart attack Sister     History   Social History  . Marital Status: Widowed    Spouse Name: N/A    Number of Children: N/A  . Years of  Education: N/A   Occupational History  . Not on file.   Social History Main Topics  . Smoking status: Never Smoker   . Smokeless tobacco: Not on file  . Alcohol Use: No  . Drug Use: No  . Sexually Active:   History reviewed. No pertinent past surgical history.   Prescriptions prior to admission  Medication Sig Dispense Refill  . aspirin EC 81 MG tablet Take 81 mg by mouth daily.        . calcium-vitamin D (OSCAL WITH D) 500-200 MG-UNIT per tablet Take 1 tablet by mouth 3 (three) times daily.        . clopidogrel (PLAVIX) 75 MG tablet Take 75 mg by mouth daily.        Marland Kitchen donepezil (ARICEPT) 10 MG tablet Take 10 mg by mouth at bedtime.        . memantine (NAMENDA) 10 MG tablet Take 10 mg by mouth 2 (two) times daily.        . metoprolol succinate (TOPROL-XL) 25 MG 24 hr tablet Take 25 mg by mouth daily.        . ranitidine (ZANTAC) 150 MG tablet Take 150 mg by mouth 2 (two) times daily.        . valsartan-hydrochlorothiazide (DIOVAN-HCT) 80-12.5 MG per tablet Take 1 tablet by mouth daily.         Echocardiogram: 5.13.2014 Moderate LVH; moderate hypokinesis of the mid and distal anteroseptal wall; EF 35-40%, decreased from a normal EF in 03/2005; no significant  valvular abnormalities; mild to moderate pulmonary hypertension.  Physical Exam: Blood pressure 107/37, pulse 65, temperature 97.7 F (36.5 C), temperature source Oral, resp. rate 16, height 5\' 6"  (1.676 m), weight 133 lb 13.1 oz (60.7 kg), SpO2 99.00%.  General: Well developed, well nourished, in no acute distress Head: Eyes PERRLA, No xanthomas.   Normal cephalic and atramatic  Lungs: Bilateral inspiratory crackles, diminished in the bases. Heart: HRRR S1 S2, occasional extra systole,  without MRG.  Pulses are 2+ & equal.            No carotid bruit. No JVD.  No abdominal bruits. No femoral bruits. Abdomen: Bowel sounds are positive, abdomen soft and non-tender without masses or                  Hernia's noted. Msk:  Back  normal, Normal strength and tone for age. Extremities: No clubbing, cyanosis or edema  DP +1 Neuro: Alert and oriented X 3. Psych:  Flat affect, no verbal response, but smiles a lot when talking to her.  Labs:   Lab Results  Component Value Date   WBC 10.6* 01/19/2013   HGB 10.0* 01/19/2013   HCT 30.4* 01/19/2013   MCV 85.9 01/19/2013   PLT 259 01/19/2013     Recent Labs Lab 01/19/13 0442  NA 140  K 3.0*  CL 96  CO2 32  BUN 32*  CREATININE 1.02  CALCIUM 10.3  PROT 7.1  BILITOT 0.6  ALKPHOS 66  ALT 9  AST 16  GLUCOSE 124*   Lab Results  Component Value Date   TROPONINI 1.66* 01/18/2013    BNP (last 3 results)  Recent Labs  01/18/13 0621  PROBNP 4525.0*   Radiology: Dg Chest Portable 1 View  01/18/2013   *RADIOLOGY REPORT*  Clinical Data: Shortness of breath.  PORTABLE CHEST - 1 VIEW  Comparison: Chest 10/15/2009.  Findings: The patient has extensive bilateral airspace disease, worst in the left mid and upper lung zones and right upper lobe. Heart size is upper normal.  No pneumothorax or pleural effusion is identified.  There appears been old healed fracture of the proximal right humerus.  IMPRESSION: Left worse than right airspace disease has an appearance most in keeping with pneumonia rather than asymmetric edema.   Original Report Authenticated By: Holley Dexter, M.D.   EKG:SR with LBBB   Total I&O: -1.8 L  ASSESSMENT AND PLAN:   1. NSTEMI:Most likely related to hypoxia in the setting of acute systolic CHF. Echo demonstrated significant change in EF to 35%-40% from normal EF in 2007. Doubt ACS.Family wishes no invasive testing and therefore, medical management will be main focus. Continue BB, ASA. D/C heparin.  2. Acute Systolic CHF in the setting of systolic dysfunction: Cannot rule out CAD and ischemia as cause of left ventricular dysfunction, however, will only treat medically. Continue diuresis and careful I/O with low sodium diet. Heart rate is controlled  currently with BB. She will likely need long term diuretic on discharge. She has diuresed well since admission with 5 lbs wt loss if this is accurate and appears comfortable on examination, without overt signs of fluid retention at this time. Will transition to PO lasix in am.  3. Hypertension: Currently well controlled on ARB and BB.   4. History of CVA: Right cerebral infarct with multiple small vessel infarcts prior to 2011. Continues on plavix and ASA as OP prior to admission.   5. Anemia: Noted Hgb of 10.0 compared to 12.2 in  March of 2013. Will hemoccult stools in the setting of DAPT, check anemia profile.   6. Dementia: Pleasantly confused and cooperative.   7.DNR: Family request. No invasive testing will be planned.   Bettey Mare. Lyman Bishop NP [facility] 01/19/2013, 9:47 AM  Cardiology Attending Patient interviewed and examined. Discussed with Joni Reining, NP.  Above note annotated and modified based upon my findings.  Patient presents with new segmental wall motion abnormality and congestive heart failure versus pneumonia. Radiographic appearance consistent with pneumonia, the clinical presentation favors congestive heart failure. Etiology likely ischemic heart disease. Medical therapy appropriate and has been initiated. Hypokalemia will require replacement.  We will follow both in hospital and subsequent to discharge.  [place] Bing, MD 01/19/2013, 5:07 PM

## 2013-01-19 NOTE — Clinical Social Work Psychosocial (Signed)
    Clinical Social Work Department BRIEF PSYCHOSOCIAL ASSESSMENT 01/19/2013  Patient:  [name], [name]     Account Number:  [id number]     Admit date:  01/18/2013  Clinical Social Worker:  Santa Genera, CLINICAL SOCIAL WORKER  Date/Time:  01/19/2013 09:00 AM  Referred by:  Physician  Date Referred:  01/18/2013 Referred for  ALF Placement   Other Referral:   Interview type:  Family Other interview type:   Patient drowsy, unable to participate in assessment.    PSYCHOSOCIAL DATA Living Status:  FACILITY Admitted from facility:  [facility] Level of care:  Assisted Living Primary support name:  Der Gagliano Primary support relationship to patient:  CHILD, ADULT Degree of support available:   Adequate support from son, patient at ALF    CURRENT CONCERNS Current Concerns  Post-Acute Placement   Other Concerns:    SOCIAL WORK ASSESSMENT / PLAN CSW met w son Lorely Bubb  in ICU, patient not alert enough to participate in assessment.  Son says he is the primary decision maker at this time - other brother is minimally involved and sister in [place] has not contacted patient in over a year.    Patient has been at [facility] since 2007, family is satisfied and appreciative of care received there. [employer] staff have been to [facility] and met w patient, are willing to take patient back at discharge.  Family prefers that facility provide transport.  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  Patient ambulates w wheelchair and is able to feed self at facility.  Is not on memory care unit, has been able to function in indepedent living section of ALF. However, son notes that she has been increasingly quiet and withdrawn over the past year.   Assessment/plan status:   Other assessment/ plan:   Information/referral to community resources:   None needed at this time    PATIENT'S/FAMILY'S RESPONSE TO PLAN OF CARE: Son  willing to work w CSW on return to [facility] when ready.  Patient unable to assist at this time, has dementia.        Santa Genera, LCSW Clinical Social Worker [phone])

## 2013-01-20 ENCOUNTER — Inpatient Hospital Stay (HOSPITAL_COMMUNITY): Payer: Medicare PPO

## 2013-01-20 DIAGNOSIS — F0390 Unspecified dementia without behavioral disturbance: Secondary | ICD-10-CM

## 2013-01-20 LAB — BASIC METABOLIC PANEL
CO2: 32 mEq/L (ref 19–32)
Calcium: 10.2 mg/dL (ref 8.4–10.5)
GFR calc non Af Amer: 24 mL/min — ABNORMAL LOW (ref >90)
Potassium: 3.9 mEq/L (ref 3.5–5.1)
Sodium: 143 mEq/L (ref 135–145)

## 2013-01-20 LAB — CBC
Hemoglobin: 9.4 g/dL — ABNORMAL LOW (ref 12.0–15.0)
MCH: 30.3 pg (ref 26.0–34.0)
MCHC: 35.1 g/dL (ref 30.0–36.0)
Platelets: 255 10*3/uL (ref 150–400)
RBC: 3.1 MIL/uL — ABNORMAL LOW (ref 3.87–5.11)

## 2013-01-20 MED ORDER — FUROSEMIDE 40 MG PO TABS
40.0000 mg | ORAL_TABLET | Freq: Every day | ORAL | Status: DC
  Administered 2013-01-21 – 2013-01-24 (×4): 40 mg via ORAL
  Filled 2013-01-20 (×4): qty 1

## 2013-01-20 MED ORDER — FERROUS SULFATE 300 (60 FE) MG/5ML PO SYRP
220.0000 mg | ORAL_SOLUTION | Freq: Every day | ORAL | Status: DC
  Administered 2013-01-20 – 2013-01-23 (×4): 220 mg via ORAL
  Filled 2013-01-20 (×6): qty 5

## 2013-01-20 NOTE — Progress Notes (Signed)
[name], [name]              ACCOUNT NO.:  [id number]  MEDICAL RECORD NO.:  [id number]  LOCATION:  IC01                          FACILITY:  [facility]  PHYSICIAN:  Sarp Vernier G. Renard Matter, MD   DATE OF BIRTH:  1919/08/16  DATE OF PROCEDURE: DATE OF DISCHARGE:                                PROGRESS NOTE   SUBJECTIVE:  This patient has moderately severe dementia.  Has a DNR. She was admitted with what was felt to be congestive heart failure, given intravenous Lasix and subsequently taken off BiPAP.  Her troponin levels were elevated.  EKG showed left bundle branch block, sinus rhythm.  Her troponins have gradually increased up to as high as 1.81 and 1.6.  Blood gases; pH 7.462 with a pCO2 of 38.6, pO2 120.  OBJECTIVE:  VITAL SIGNS:  Blood pressure 113/58, respirations 18, pulse 73, temp 98.6. HEENT:  Eyes, PERRLA.  TM negative.  Oropharynx benign. LUNGS:  Occasional rhonchus heard over lower lung fields. HEART:  Regular rhythm and rate.  No murmurs. ABDOMEN:  No palpable organs or masses. SKIN:  Warm and dry. EXTREMITIES:  Free of edema.  ASSESSMENT:  The patient is thought to be in congestive heart failure and possible non-ST myocardial infarction and dementia.  PLAN:  To continue to monitor cardiac markers.  Obtain echocardiogram. Continue heparin drip.  We will obtain Cardiology consult today.  As stated, the patient is a no code patient.     Jessicca Stitzer G. Renard Matter, MD     AGM/MEDQ  D:  01/19/2013  T:  01/20/2013  Job:  [id number]

## 2013-01-20 NOTE — Progress Notes (Signed)
PT TRANFERING TO MED/SURG ROOM 338. PT AROUSEABLE . REMAINS TOTAL CARE. RT AC IV NSL REMAINS PATENT. FOLEY CATHETER D/C'D PER PROTOCOL SINCE IV LASIX D/C'D. LUNG CLEAR BUT DIMINISHED.  PT MUST BE FEED. REPORT CALLED TO CECILI AXTON RN ON 300

## 2013-01-20 NOTE — Progress Notes (Signed)
SUBJECTIVE:Breathing better, but continues to have some coughing.  Active Problems:   CHF (congestive heart failure)   Essential hypertension   Dementia   CVA (cerebral vascular accident)  LABS: Basic Metabolic Panel:  Recent Labs  08/65/78 0442 01/20/13 0423  NA 140 143  K 3.0* 3.9  CL 96 100  CO2 32 32  GLUCOSE 124* 142*  BUN 32* 48*  CREATININE 1.02 1.76*  CALCIUM 10.3 10.2   Liver Function Tests:  Recent Labs  01/19/13 0442  AST 16  ALT 9  ALKPHOS 66  BILITOT 0.6  PROT 7.1  ALBUMIN 2.7*   CBC:  Recent Labs  01/19/13 0442 01/20/13 0423  WBC 10.6* 9.5  HGB 10.0* 9.4*  HCT 30.4* 26.8*  MCV 85.9 86.5  PLT 259 255   Cardiac Enzymes:  Recent Labs  01/18/13 0806 01/18/13 1432 01/18/13 1958  TROPONINI 0.85* 1.81* 1.66*    Thyroid Function Tests:  Recent Labs  01/18/13 0806  TSH 1.223   PHYSICAL EXAM BP 102/56  Pulse 76  Temp(Src) 98.6 F (37 C) (Oral)  Resp 19  Ht 5\' 6"  (1.676 m)  Wt 134 lb 11.2 oz (61.1 kg)  BMI 21.75 kg/m2  SpO2 95% General: Well developed, well nourished, in no acute distress Head: Eyes PERRLA, No xanthomas.   Normal cephalic and atramatic  Lungs: Clear bilaterally to auscultation with some upper airway congestion, non-productive coughing. Heart: HRRR S1 S2, No MRG. Pulses are 2+ & equal.  No carotid bruit. No JVD.  No abdominal bruits. No femoral bruits. Abdomen: Bowel sounds are positive, abdomen soft and non-tender without masses  Msk:  Back normal, . Normal strength and tone for age. Extremities: No clubbing, cyanosis or edema.  DP +1 Neuro: Alert  Psych:  Good affect, no verbal response  TELEMETRY: Reviewed telemetry pt in: NSR rate in the  80's.   Total I&O since admission: -2 L; weight stable  ASSESSMENT AND PLAN: 1. NSTEMI:Most likely related to hypoxia in the setting of acute systolic CHF. Echo demonstrated significant decrease in EF to 35%-40% compared to 2007. Family wishes no invasive testing and  therefore, medical management will be main focus. On metoprolol 25 mg BID and ASA, along with plavix.   2. Acute Systolic CHF in the setting of systolic dysfunction: Cannot rule out CAD and ischemia as cause of left ventricular dysfunction, however, will only treat medically. Weight essentially unchanged from yesterday on 40 mg IV BID. Creatinine increased to 1.76. Will transition to PO lasix, 40 mg daily.  3. Hypertension: Low normal on BB and ARB.   4. History of CVA: Right cerebral infarct with multiple small vessel infarcts prior to 2011. Continues on plavix and ASA as OP prior to admission.   5. Anemia:  Hemoccult stools in the setting of DAPT are not reported yet, iron level is low per anemia profile. Will start iron replacement therapy. Hgb decreased to 9.4.  6. Dementia: Pleasantly confused and cooperative.   7.DNR: Family request. No invasive testing will be planned.   Bettey Mare. Lyman Bishop NP [facility] 01/20/2013, 8:58 AM  Cardiology Attending Patient interviewed and examined. Discussed with Joni Reining, NP.  Above note annotated and modified based upon my findings.  Only a modest diuresis has been achieved without a significant weight loss and with a moderate elevation in BNP level.  Chest x-ray shows improvement in pulmonary edema.  Rapid increase in creatinine is of concern. Agree with plan to moderate dose of furosemide pending  a repeat assessment of renal function.  Despite concern raised by radiologist that CXR findings could represent pneumonia, there is no clinical evidence for same. A course of antibiotics does not appear necessary. We will continue to optimize treatment for congestive heart failure.  [place] Bing, MD 01/20/2013, 6:48 PM

## 2013-01-21 LAB — BASIC METABOLIC PANEL
CO2: 31 mEq/L (ref 19–32)
Chloride: 100 mEq/L (ref 96–112)
Glucose, Bld: 142 mg/dL — ABNORMAL HIGH (ref 70–99)
Sodium: 141 mEq/L (ref 135–145)

## 2013-01-21 LAB — CBC
Hemoglobin: 9.8 g/dL — ABNORMAL LOW (ref 12.0–15.0)
Platelets: 287 10*3/uL (ref 150–400)
RBC: 3.45 MIL/uL — ABNORMAL LOW (ref 3.87–5.11)
WBC: 10.7 10*3/uL — ABNORMAL HIGH (ref 4.0–10.5)

## 2013-01-21 NOTE — Plan of Care (Signed)
Problem: Phase I Progression Outcomes Goal: Pt./family verbalizes plan of care Outcome: Progressing Discussed what plan of care for possible abx therapy according to DR. Mcinnis progress note.

## 2013-01-21 NOTE — Progress Notes (Signed)
[name], [name]              ACCOUNT NO.:  [id number]  MEDICAL RECORD NO.:  [id number]  LOCATION:  A338                          FACILITY:  [facility]  PHYSICIAN:  Asael Pann G. Renard Matter, MD   DATE OF BIRTH:  05/20/1919  DATE OF PROCEDURE: DATE OF DISCHARGE:                                PROGRESS NOTE   SUBJECTIVE:  This patient continues to have moderately severe dementia and is a DNR.  She has acute on chronic congestive heart failure with elevated troponin level suggestive of ischemia.  EKG showed left bundle- branch block.  She does have segmental wall of congestive heart failure versus pneumonia and radiological appearance gives appearance of pneumonia.  She did have hypokalemia as well.  Her condition is much the same.  OBJECTIVE:  VITAL SIGNS:  Blood pressure 124/44, respirations 20, pulse 93, temp 98. HEENT:  Eyes PERRLA.  TMs negative.  Oropharynx benign. LUNGS:  Occasional rhonchus heard over lower lung fields. HEART:  Regular rhythm and rate.  No murmurs. ABDOMEN:  No palpable organs or masses. SKIN:  Warm and dry. EXTREMITIES:  Free of edema.  LABORATORY DATA:  Pertinent chemistries showed hemoglobin 9.4, hematocrit 26.1 with creatinine 1.76.  ASSESSMENT:  The patient thought to be in congestive heart failure with low ejection fraction and possible non ST myocardial infarction and dementia.  She does have evidence, which could be early pneumonia.  We will consider again antibiotic coverage.  Despite chest x-ray findings, which could represent pneumonia, there is no clinical evidence of a sign.  Current course of antibiotics does not appear necessary. Continue to optimize treatment for the congestive heart failure.     Lakindra Wible G. Renard Matter, MD     AGM/MEDQ  D:  01/21/2013  T:  01/21/2013  Job:  [id number]

## 2013-01-21 NOTE — Progress Notes (Signed)
Notified Dr. Ardelle Balls to inquire about if he wanted the patient to be on abx or not.  He stated he wanted to reassess the CXR in the am.  New orders given and followed.

## 2013-01-21 NOTE — Progress Notes (Signed)
SUBJECTIVE: Denies dyspnea or chest discomfort..   Basic Metabolic Panel:  Recent Labs  29/56/21 0423 01/21/13 0545  NA 143 141  K 3.9 3.4*  CL 100 100  CO2 32 31  GLUCOSE 142* 142*  BUN 48* 58*  CREATININE 1.76* 1.05  CALCIUM 10.2 10.3   Liver Function Tests:  Recent Labs  01/19/13 0442  AST 16  ALT 9  ALKPHOS 66  BILITOT 0.6  PROT 7.1  ALBUMIN 2.7*   CBC:  Recent Labs  01/20/13 0423 01/21/13 0545  WBC 9.5 10.7*  HGB 9.4* 9.8*  HCT 26.8* 30.0*  MCV 86.5 87.0  PLT 255 287   Cardiac Enzymes:  Recent Labs  01/18/13 1432 01/18/13 1958  TROPONINI 1.81* 1.66*    RADIOLOGY: Dg Chest Port 1 View  01/20/2013   *RADIOLOGY REPORT*  Clinical Data: Pneumonia.  PORTABLE CHEST - 1 VIEW  Comparison: Plain film chest 01/18/2013.  Findings: Bilateral airspace disease, worse on the left, persists but has improved.  There is no pneumothorax.  Trace bilateral pleural effusions are noted.  Heart size is upper normal.  IMPRESSION: Persistent but improved left worse than right airspace disease.   Original Report Authenticated By: Holley Dexter, M.D.   PHYSICAL EXAM BP 124/44  Pulse 93  Temp(Src) 98.1 F (36.7 C) (Axillary)  Resp 20  Ht 5\' 6"  (1.676 m)  Wt 133 lb 6.1 oz (60.5 kg)  BMI 21.54 kg/m2  SpO2 93%Wt loss 4 lbs. General: Well developed, well nourished, in no acute distress Head: Eyes PERRLA, No xanthomas.   Normal cephalic and atramatic  Lungs: Clear bilaterally to auscultation diminished on the right base. .  Pulses are 2+ & equal.            No carotid bruit. No JVD.  No abdominal bruits. No femoral bruits. Abdomen: Bowel sounds are positive, abdomen soft and non-tender without masses or                  Hernia's noted. Msk:  Back normal,.Diminished overall  strength and tone for age. Extremities: No clubbing, cyanosis or edema.  DP +1 Neuro: Alert  Psych:  Good affect, responds appropriately  TELEMETRY: Reviewed telemetry pt in: NSR  ASSESSMENT AND  PLAN:  1. NSTEMI: Related to hypoxia in the setting of acute systolic CHF. Family does not wish any invasive testing. Medical management will be maintained. She will continue on metoprolol, aspirin.  2. Acute Systolic CHF in the setting of systolic dysfunction: Patient has significant decrease in LV function 35-40% compared to prior echo in 2007 with normal EF. The patient will need to be placed on by mouth Lasix 40 mg daily on discharge. Weight loss has been minimal. But breathing status has improved. No evidence of CHF on chest x-ray on 01/20/2013. Creatinine 1.05. Markedly improved since initial admission. From cardiac standpoint she is stable and can be discharged back to [facility] at the discretion of PCP. Not a candidate for AICD.  3. Anemia: Her anemia profile iron deficiency is noted the patient has been placed on iron replacement therapy in liquid form for ease of administration.  4. Hypertension: Blood pressure well-controlled currently. Remains on irbesartan 75 mg daily, Toprol 25 mg by mouth daily. Heart rate is well-controlled.  5. DNR: Family wishes no invasive testing or aggressive measures. This was respected medical management only.  Bettey Mare. Lyman Bishop NP [facility] 01/21/2013, 9:48 AM  Cardiology Attending Patient interviewed and examined. Discussed with Joni Reining, NP.  Above note annotated and modified based upon my findings.  Dramatic increase in creatinine yesterday has now dramatically decreased back to normal. Yesterday's finding likely represents a laboratory error. Treatment with diuretics can continue as an outpatient. Agree with plans for discharge.  [place] Bing, MD 01/21/2013, 10:22 AM

## 2013-01-21 NOTE — Clinical Documentation Improvement (Signed)
RESPIRATORY FAILURE DOCUMENTATION CLARIFICATION QUERY   THIS DOCUMENT IS NOT A PERMANENT PART OF THE MEDICAL RECORD  TO RESPOND TO THE THIS QUERY, FOLLOW THE INSTRUCTIONS BELOW:  1. If needed, update documentation for the patient's encounter via the notes activity.  2. Access this query again and click edit on the In [employer].  3. After updating, or not, click F2 to complete all highlighted (required) fields concerning your review. Select "additional documentation in the medical record" OR "no additional documentation provided".  4. Click Sign note button.  5. The deficiency will fall out of your In Basket *Please let us know if you are not able to complete this workflow by phone or e-mail (listed below).  Please update your documentation within the medical record to reflect your response to this query.                                                                                    01/21/13  Dear Dr.McInnis Marton Redwood,  In a better effort to capture your patient's severity of illness, reflect appropriate length of stay and utilization of resources, a review of the patient medical record has revealed the following indicators.   Based on your clinical judgment, please clarify and document in a progress note and/or discharge summary the clinical condition associated with the following supporting information: In responding to this query please exercise your independent judgment.  The fact that a query is asked, does not imply that any particular answer is desired or expected.  Please clarify respiratory status. Thank you  Possible Clinical Conditions?  Acute Respiratory Failure Acute on Chronic Respiratory Failure Chronic Respiratory Failure Other Condition________________ Cannot Clinically Determine    Supporting Information:  Risk Factors: presented to the emergency room in respiratory distress required BiPAP for a short while ED provider note:Respiratory: Lung fields  show inspiratory crackles in the mid and lower zones anteriorly Hypoxia   Signs&Symptoms: Shortness of breath  Diagnostics: ABG's     PO2 120     PCO2  38.6     PH  7.462     BiCarb  27.2     Date of ABG's  01/18/13     O2 concentration: 50%     O2 mode: BiPap   Radiology: CXR IMPRESSION: Left worse than right airspace disease has an appearance most in keeping with pneumonia rather than asymmetric edema.   Treatment: BiPap O2 2L/min via [place] Keep sats > 92% Pulse Ox with Vital signs Bedrest Lasix 40mg  daily                  You may use possible, probable, or suspect with inpatient documentation. possible, probable, suspected diagnoses MUST be documented at the time of discharge  Reviewed: no response  Thank You,  Harless Litten RN, MSN Clinical Documentation Specialist: Office# [phone] [facility] Health Information Management [place]

## 2013-01-21 NOTE — Clinical Documentation Improvement (Signed)
RENAL FAILURE DOCUMENTATION CLARIFICATION QUERY  THIS DOCUMENT IS NOT A PERMANENT PART OF THE MEDICAL RECORD  TO RESPOND TO THE THIS QUERY, FOLLOW THE INSTRUCTIONS BELOW:  1. If needed, update documentation for the patient's encounter via the notes activity.  2. Access this query again and click edit on the In [employer].  3. After updating, or not, click F2 to complete all highlighted (required) fields concerning your review. Select "additional documentation in the medical record" OR "no additional documentation provided".  4. Click Sign note button.  5. The deficiency will fall out of your In Basket *Please let us know if you are not able to complete this workflow by phone or e-mail (listed below).  Please update your documentation within the medical record to reflect your response to this query.                                                                                    01/21/13  Dear Dr.McInnis  In a better effort to capture your patient's severity of illness, reflect appropriate length of stay and utilization of resources, a review of the patient medical record has revealed the following indicators.   Based on your clinical judgment, please clarify and document in a progress note and/or discharge summary the clinical condition associated with the following supporting information: In responding to this query please exercise your independent judgment.  The fact that a query is asked, does not imply that any particular answer is desired or expected.  Possible Clinical Conditions?  Acute Renal Failure Acute Kidney Injury Acute Tubular Necrosis Acute on Chronic Renal Failure Chronic Renal Failure Other Condition_____________ Cannot Clinically Determine   Supporting Information:  Risk Factors: Advanced age History of hypertension History of CVA  Signs and Symptoms: Shortness of breath  Diagnostics: BUN/CR/GFR 5/14 = 32/1.02/47 5/15 = 48/1.76/24 5/16 =  58/1.05/45  Treatments: Saline Lock                Monitor BMP I&O Daily weights  You may use possible, probable, or suspect with inpatient documentation. possible, probable, suspected diagnoses MUST be documented at the time of discharge  Reviewed: Documented Acute renal failure in [facility]-Dr. Renard Matter Thank You,  Harless Litten RN, MSN Clinical Documentation Specialist: Office# [phone] [facility] [place]

## 2013-01-21 NOTE — Progress Notes (Signed)
[name], [name]              ACCOUNT NO.:  [id number]  MEDICAL RECORD NO.:  [id number]  LOCATION:  A338                          FACILITY:  [facility]  PHYSICIAN:  Ermine Stebbins G. Renard Matter, MD   DATE OF BIRTH:  May 07, 1919  DATE OF PROCEDURE: DATE OF DISCHARGE:                                PROGRESS NOTE   HISTORY:  This patient continues to have moderately severe dementia. She is a DNR.  She has acute on chronic congestive heart failure.  Her troponin levels have been elevated and suggest ischemia.  EKG showed LBBB, sinus rhythm.  Her heparin drip has been discontinued.  She had been seen by Cardiology.  They note she has segmental wall abnormality and congestive heart failure versus pneumonia.  Radiologic evidence gives appearance of pneumonia.  Clinical presentation congestive heart failure, likely history of ischemic heart disease.  The patient did have hypokalemia as well.  OBJECTIVE:  VITAL SIGNS:  Blood pressure 102/56, respirations 18, pulse 76, temperature 99.  Her hemoglobin is lower at 9.4, hematocrit 26.8. Her potassium is 3.9.  Creatinine 1.76. HEENT:  Eyes; PERRLA.  TM negative. Oropharynx benign. LUNGS:  Occasional rhonchus heard over lower lung fields. HEART:  Regular rhythm and rate.  No murmurs. ABDOMEN:  No palpable organs or masses. SKIN:  Warm and dry. EXTREMITIES:  Free of edema.  ASSESSMENT:  The patient is thought to be in congestive heart failure with low ejection fraction and possible non-ST myocardial infarction and dementia.  She does have on x-rays evidence what could be early pneumonia.  We will consider starting antibiotic coverage.     [name] G. Renard Matter, MD     AGM/MEDQ  D:  01/20/2013  T:  01/21/2013  Job:  [id number]

## 2013-01-21 NOTE — Clinical Social Work Note (Signed)
CSW reviewed chart and will continue to follow. Plan remains for pt to return to [facility] when medically stable.   Derenda Fennel, [place] [phone]

## 2013-01-22 ENCOUNTER — Inpatient Hospital Stay (HOSPITAL_COMMUNITY): Payer: Medicare PPO

## 2013-01-22 LAB — CBC
HCT: 31.2 % — ABNORMAL LOW (ref 36.0–46.0)
Hemoglobin: 10 g/dL — ABNORMAL LOW (ref 12.0–15.0)
MCV: 87.9 fL (ref 78.0–100.0)
RBC: 3.55 MIL/uL — ABNORMAL LOW (ref 3.87–5.11)
WBC: 10.3 10*3/uL (ref 4.0–10.5)

## 2013-01-22 LAB — BASIC METABOLIC PANEL
BUN: 50 mg/dL — ABNORMAL HIGH (ref 6–23)
CO2: 36 mEq/L — ABNORMAL HIGH (ref 19–32)
Chloride: 99 mEq/L (ref 96–112)
Creatinine, Ser: 0.91 mg/dL (ref 0.50–1.10)
Glucose, Bld: 156 mg/dL — ABNORMAL HIGH (ref 70–99)

## 2013-01-22 NOTE — Progress Notes (Signed)
[name], [name]              ACCOUNT NO.:  [id number]  MEDICAL RECORD NO.:  [id number]  LOCATION:  A338                          FACILITY:  [facility]  PHYSICIAN:  Davonta Stroot G. Renard Matter, MD   DATE OF BIRTH:  02-Aug-1919  DATE OF PROCEDURE: DATE OF DISCHARGE:                                PROGRESS NOTE   SUBJECTIVE:  This patient has moderately severe dementia and is a DNR. She appears to be somewhat more alert today.  She has acute on chronic congestive heart failure with elevated troponin suggestive of ischemia. EKG showed left bundle-branch block.  She did have hypokalemia, possible non ST myocardial infarction.  OBJECTIVE:  VITAL SIGNS:  Blood pressure 130/77, respirations 18, pulse 76, temp 97.8. HEENT:  Eyes PERRLA.  TMs negative.  Oropharynx benign. NECK:  Supple.  No JVD or thyroid abnormalities. HEART:  Regular rhythm.  No murmurs. LUNGS:  Occasional rhonchus heard over lower lung field. ABDOMEN:  No palpable organs or masses. SKIN:  Warm and dry. EXTREMITIES:  Free of edema.  ASSESSMENT:  The patient is felt to be in congestive heart failure with low ejection fraction, possible ST myocardial infarction, and does have dementia.  She does have possible early pneumonia.  We will again consider antibiotic coverage.  We will repeat chest x-ray today and make this determination following x-ray.  The patient resides in rest home, __________ House, but may require higher level of care upon discharge.     Lenzy Kerschner G. Renard Matter, MD     AGM/MEDQ  D:  01/22/2013  T:  01/22/2013  Job:  [id number]

## 2013-01-23 LAB — CBC
HCT: 31.3 % — ABNORMAL LOW (ref 36.0–46.0)
MCH: 28.5 pg (ref 26.0–34.0)
MCHC: 32.3 g/dL (ref 30.0–36.0)
MCV: 88.2 fL (ref 78.0–100.0)
RDW: 14 % (ref 11.5–15.5)
WBC: 9.9 10*3/uL (ref 4.0–10.5)

## 2013-01-23 NOTE — Progress Notes (Signed)
[name], [name]              ACCOUNT NO.:  [id number]  MEDICAL RECORD NO.:  [id number]  LOCATION:  A338                          FACILITY:  [facility]  PHYSICIAN:  Cher Egnor G. Renard Matter, MD   DATE OF BIRTH:  11/05/1918  DATE OF PROCEDURE: DATE OF DISCHARGE:                                PROGRESS NOTE   SUBJECTIVE:  This patient's condition remains much the same.  She was a little more alert yesterday but still sleeps a great deal during the day.  She has been treated for acute-on-chronic congestive heart failure with elevated troponin suggestive of coronary ischemia.  EKG showed left bundle-branch block.  She did have hypokalemia and possible non-ST myocardial infarction.  She has been treated conservatively with no active interventions.  A repeat chest x-ray was completed and showed stable bilateral airspace disease, left greater than right.  OBJECTIVE:  VITAL SIGNS:  Blood pressure 137/75, respirations 18, pulse 79, temperature 97.9. HEENT:  Eyes PERRLA.  TMs negative.  Oropharynx benign. NECK:  Supple.  No JVD or thyroid abnormalities. HEART:  Regular rhythm.  No murmurs. LUNGS:  Occasional rhonchus heard over lower lung fields. ABDOMEN:  No palpable __________ or masses. SKIN:  Warm and dry. EXTREMITIES:  Free of edema.  ASSESSMENT:  The patient is felt to be in systolic congestive heart failure with low ejection fraction, possible previous ST myocardial infarction and dementia.  She does have airspace disease and consideration of pneumonia was fair, but she is not on antibiotic coverage now.  Patient resides in rest home and may require higher level of care upon discharge.     Maurilio Puryear G. Renard Matter, MD     AGM/MEDQ  D:  01/23/2013  T:  01/23/2013  Job:  [id number]

## 2013-01-24 LAB — CBC
Hemoglobin: 10 g/dL — ABNORMAL LOW (ref 12.0–15.0)
MCH: 28.3 pg (ref 26.0–34.0)
MCHC: 31.8 g/dL (ref 30.0–36.0)

## 2013-01-24 MED ORDER — IRBESARTAN 75 MG PO TABS: 75.0000 mg | ORAL_TABLET | Freq: Every day | ORAL | Status: DC

## 2013-01-24 MED ORDER — FUROSEMIDE 40 MG PO TABS: 20.0000 mg | ORAL_TABLET | Freq: Every day | ORAL | Status: DC

## 2013-01-24 MED ORDER — FERROUS SULFATE 300 (60 FE) MG/5ML PO SYRP: 300.0000 mg | ORAL_SOLUTION | Freq: Every day | ORAL | Status: DC

## 2013-01-24 NOTE — Clinical Social Work Note (Signed)
[facility] evaluated patient, are considering whether they can meet patient needs at discharge.  Will advise if they can take patient this afternoon.  Santa Genera, LCSW Clinical Social Worker [phone])

## 2013-01-24 NOTE — Progress Notes (Addendum)
Called [id number] and verified with Georges Lynch that the the bed/ o2 had been delivered.  [facility] notified that transportation will be needed.

## 2013-01-24 NOTE — Discharge Summary (Signed)
[name], [name]              ACCOUNT NO.:  [id number]  MEDICAL RECORD NO.:  [id number]  LOCATION:  A338                          FACILITY:  [facility]  PHYSICIAN:  Errika Narvaiz G. Renard Matter, MD   DATE OF BIRTH:  1918/10/08  DATE OF ADMISSION:  01/18/2013 DATE OF DISCHARGE:  05/19/2014LH                              DISCHARGE SUMMARY   DIAGNOSES: 1. Respiratory distress secondary to acute systolic congestive heart     failure. 2. Coronary artery disease. 3. Non-ST elevation myocardial infarction related to hypoxia. 4. Essential hypertension 5. Dementia. 6. Acute renal failure. 7. Anemia secondary to iron deficiency. 8. Hypokalemia. 9. Clinical picture of pneumonia, however, more consistent with     congestive heart failure.  CONDITION:  Stable at the time of her discharge back to nursing facility.  This patient was admitted through the ED with shortness of breath.  The patient resides at the extended care facility, was brought in by EMS with shortness of breath, which has gotten worse over the previous hour. We placed the patient on CPAP because of hypoxia with pO2 in the 80s and rales throughout all lung.  PHYSICAL EXAMINATION:  HEENT:  Negative. NECK:  Supple.  No JVD or thyroid abnormalities.  No tracheal deviation. HEART:  Regular rhythm.  No murmurs. LUNGS:  No stridor, rales posteriorly and tachypneic. ABDOMEN:  No palpable organs or masses. EXTREMITIES:  Trace edema. NEUROLOGIC:  The patient was awake and alert and following commands.  ADMISSION LABORATORY DATA:  CBC; WBC 8600 with hemoglobin 10.5, hematocrit 30.8.  ProBNP 4525.  Blood gases on admission pH 7.462 with a pCO2 of 38, pO2 of 120.  Troponin on admission 0.85.  Subsequent troponin 1.81 and 1.61.  Creatinine on admission was 1.76, subsequently was normal.  Chemistries; sodium 140, potassium 3, chloride 96, CO2 of 32, glucose 124, BUN 32, creatinine 1.02, calcium 10.3, total protein 7.1, alkaline phosphatase 66.   Chest x-ray on admission, left worse than right airspace disease.  Thought it could be due to asymmetric edema or possible pneumonia.  EKG on admission, normal sinus rhythm, ST and T- wave abnormalities, nonspecific ST and T-wave changes, conduction disturbances, but left bundle branch block.  Subsequent chest x-ray, persistent, but improved, left airspace disease.  Additional pertinent labs; CBC on Jan 23, 2013, WBC 9.9, hemoglobin 10.1, hematocrit 31.3. Basic metabolic panel on Jan 22, 2013, sodium 144, potassium 3.6, chloride 99, CO2 of 36, glucose 156, BUN 50, creatinine 0.91, calcium 10.7.  Serum iron 22 and iron saturation 11.  HOSPITAL COURSE:  The patient on admission was admitted 1st to the ICU. She is a DNR and the patient's family were present and desired only supportive care with no cardiovascular interventions.  She was started on sodium chloride 0.9% intravenously.  She was continued on aspirin 81 mg daily, Os-Cal D 500/200 one daily, continued on Plavix 75 mg daily, Aricept 10 mg daily, Pepcid 20 mg daily.  Was given Lasix 40 mg daily, Avapro 75 mg daily, Namenda 10 mg daily, metoprolol 25 mg daily.  The patient was seen in consultation by Cardiology for 2D echo.  The 2D echo report showed ejection fraction 35%-40% with left ventricular hypertrophy,  hypokinesis with elevated troponins and it was felt that she did have evidence of coronary ischemia, but because of family wishes, no invasive testing was felt to be indicated, but medical management was felt to be indicated with diuresis with Lasix and further supportive care.  The patient throughout her hospitalization was lethargic and did not respond much to caregivers.  There was some question of whether or not she did have a pneumonia, but the clinical picture was more of congestive heart failure and she was treated essentially for this condition.  She did slowly and progressively improve some throughout her hospital  stay.  Her hypokalemia was corrected.  Her creatinine, which was initially returned to normal range.  It was felt by Cardiology that she had NSTEMI related to hypoxia and definitely had evidence of congestive heart failure with low ejection fraction 35%-40% with proBNP being 4525.  She remained stable, was felt she could be moved back to the nursing facility.     Sangita Zani G. Renard Matter, MD     AGM/MEDQ  D:  01/24/2013  T:  01/24/2013  Job:  [id number]

## 2013-01-24 NOTE — Progress Notes (Signed)
PT Cancellation Note  Patient Details Name: [name] MRN: [id number] DOB: 12-Oct-1918   Cancelled Treatment:    Reason Eval/Treat Not Completed: Other (comment) Pt was found to be staring off blankly into space when I arrived to do a PT evaluation.  She would not respond to any stimulation or acknowledge my presence.  Per the chart, she has severe dementia and I am not sure if this is her baseline.  We will try again tomorrow to do an evaluation, but if she continues the way she is today, we will have to cancel our orders. Myrlene Broker L 01/24/2013, 9:46 AM

## 2013-01-24 NOTE — Clinical Social Work Note (Addendum)
Pt unable to work with PT today. [employer] assessed pt and feel they can continue to meet pt's needs at their facility. MD and pt's son, Leonette Most notified and agreeable to return. Pt to transfer via [facility], to be arranged by RN once equipment is delivered to facility and they call RN. Pt will have oxygen and hospital bed delivered to [facility]. CSW faxed FL2 and d/c summary.   [name], [place] [phone]

## 2013-01-24 NOTE — Progress Notes (Signed)
O2 Saturations:  1335 Room air sats were 84%.  O2 at 2 LPM was restarted via n/c. 1400 O2 sats on 2 lpm was 100%

## 2013-01-24 NOTE — Care Management Note (Signed)
    Page 1 of 1   01/24/2013     2:29:48 PM   CARE MANAGEMENT NOTE 01/24/2013  Patient:  [name], [name]   Account Number:  [id number]  Date Initiated:  01/24/2013  Documentation initiated by:  Rosemary Holms  Subjective/Objective Assessment:   Pt resident at [facility]. Pt to be DC'd back to [facility] today. Pt will need Hospital bed and O2.     Action/Plan:   Anticipated DC Date:  01/24/2013   Anticipated DC Plan:        DC Planning Services  CM consult      Choice offered to / List presented to:     DME arranged  HOSPITAL BED  [facility]      DME agency  [facility]        Status of service:  Completed, signed off Medicare Important Message given?   (If response is "NO", the following Medicare IM given date fields will be blank) Date Medicare IM given:   Date Additional Medicare IM given:    Discharge Disposition:  ASSISTED LIVING  Per UR Regulation:    If discussed at Long Length of Stay Meetings, dates discussed:    Comments:  01/24/13 Rosemary Holms RN BSN CM Pt to be DC'd to [facility] with O2. Pt's sats on room air was 84% at rest. O2 replaced and sats rebounded to 100%. Due to CHF and hypoxia, pt needs home O2. DME agency will be [facility] due to only agency with [facility] Choice

## 2013-01-25 NOTE — Discharge Summary (Signed)
[name], [name]              ACCOUNT NO.:  [id number]  MEDICAL RECORD NO.:  [id number]  LOCATION:  A338                          FACILITY:  [facility]  PHYSICIAN:  Bijou Easler G. Renard Matter, MD   DATE OF BIRTH:  [birth year]  DATE OF ADMISSION:  01/18/2013 DATE OF DISCHARGE:  05/19/2014LH                              DISCHARGE SUMMARY   The patient will be discharged on the following medications: 1. Ferrous sulfate 300 mg daily. 2. Furosemide 20 mg daily. 3. Irbesartan 75 mg daily. 4. Aspirin 81 mg daily. 5. Calcium with vitamin D 500/200 mg daily. 6. Clopidogrel 75 mg daily. 7. Donepezil 10 mg daily. 8. Namenda 10 mg twice a day. 9. Metoprolol succinate 25 mg daily. 10.Ranitidine 150 mg daily.     Paolo Okane G. Renard Matter, MD     AGM/MEDQ  D:  01/24/2013  T:  01/25/2013  Job:  [id number]

## 2013-02-01 ENCOUNTER — Inpatient Hospital Stay (HOSPITAL_COMMUNITY)
Admission: EM | Admit: 2013-02-01 | Discharge: 2013-02-05 | DRG: 871 | Disposition: A | Discharge status: Nursing Home (Includes ICF) | Payer: Medicare PPO | Attending: Family Medicine | Admitting: Family Medicine

## 2013-02-01 ENCOUNTER — Emergency Department (HOSPITAL_COMMUNITY): Payer: Medicare PPO

## 2013-02-01 ENCOUNTER — Encounter (HOSPITAL_COMMUNITY): Payer: Self-pay | Admitting: Emergency Medicine

## 2013-02-01 DIAGNOSIS — Z79899 Other long term (current) drug therapy: Secondary | ICD-10-CM

## 2013-02-01 DIAGNOSIS — I5020 Unspecified systolic (congestive) heart failure: Secondary | ICD-10-CM

## 2013-02-01 DIAGNOSIS — N390 Urinary tract infection, site not specified: Secondary | ICD-10-CM | POA: Diagnosis present

## 2013-02-01 DIAGNOSIS — Z8249 Family history of ischemic heart disease and other diseases of the circulatory system: Secondary | ICD-10-CM

## 2013-02-01 DIAGNOSIS — J4489 Other specified chronic obstructive pulmonary disease: Secondary | ICD-10-CM | POA: Diagnosis present

## 2013-02-01 DIAGNOSIS — E870 Hyperosmolality and hypernatremia: Secondary | ICD-10-CM | POA: Diagnosis present

## 2013-02-01 DIAGNOSIS — Z7902 Long term (current) use of antithrombotics/antiplatelets: Secondary | ICD-10-CM

## 2013-02-01 DIAGNOSIS — A419 Sepsis, unspecified organism: Principal | ICD-10-CM | POA: Diagnosis present

## 2013-02-01 DIAGNOSIS — E860 Dehydration: Secondary | ICD-10-CM | POA: Diagnosis present

## 2013-02-01 DIAGNOSIS — J189 Pneumonia, unspecified organism: Secondary | ICD-10-CM | POA: Diagnosis present

## 2013-02-01 DIAGNOSIS — I252 Old myocardial infarction: Secondary | ICD-10-CM

## 2013-02-01 DIAGNOSIS — D649 Anemia, unspecified: Secondary | ICD-10-CM | POA: Diagnosis present

## 2013-02-01 DIAGNOSIS — J9620 Acute and chronic respiratory failure, unspecified whether with hypoxia or hypercapnia: Secondary | ICD-10-CM | POA: Diagnosis present

## 2013-02-01 DIAGNOSIS — I5022 Chronic systolic (congestive) heart failure: Secondary | ICD-10-CM | POA: Diagnosis present

## 2013-02-01 DIAGNOSIS — M810 Age-related osteoporosis without current pathological fracture: Secondary | ICD-10-CM | POA: Diagnosis present

## 2013-02-01 DIAGNOSIS — R0681 Apnea, not elsewhere classified: Secondary | ICD-10-CM | POA: Diagnosis present

## 2013-02-01 DIAGNOSIS — I509 Heart failure, unspecified: Secondary | ICD-10-CM | POA: Diagnosis present

## 2013-02-01 DIAGNOSIS — R6520 Severe sepsis without septic shock: Secondary | ICD-10-CM | POA: Diagnosis present

## 2013-02-01 DIAGNOSIS — R4182 Altered mental status, unspecified: Secondary | ICD-10-CM

## 2013-02-01 DIAGNOSIS — I10 Essential (primary) hypertension: Secondary | ICD-10-CM | POA: Diagnosis present

## 2013-02-01 DIAGNOSIS — Z66 Do not resuscitate: Secondary | ICD-10-CM | POA: Diagnosis present

## 2013-02-01 DIAGNOSIS — J449 Chronic obstructive pulmonary disease, unspecified: Secondary | ICD-10-CM | POA: Diagnosis present

## 2013-02-01 DIAGNOSIS — Z8673 Personal history of transient ischemic attack (TIA), and cerebral infarction without residual deficits: Secondary | ICD-10-CM

## 2013-02-01 DIAGNOSIS — E876 Hypokalemia: Secondary | ICD-10-CM | POA: Diagnosis present

## 2013-02-01 DIAGNOSIS — T68XXXA Hypothermia, initial encounter: Secondary | ICD-10-CM

## 2013-02-01 DIAGNOSIS — R627 Adult failure to thrive: Secondary | ICD-10-CM | POA: Diagnosis present

## 2013-02-01 DIAGNOSIS — F0390 Unspecified dementia without behavioral disturbance: Secondary | ICD-10-CM | POA: Diagnosis present

## 2013-02-01 DIAGNOSIS — I5040 Unspecified combined systolic (congestive) and diastolic (congestive) heart failure: Secondary | ICD-10-CM

## 2013-02-01 DIAGNOSIS — G9340 Encephalopathy, unspecified: Secondary | ICD-10-CM | POA: Diagnosis present

## 2013-02-01 DIAGNOSIS — I639 Cerebral infarction, unspecified: Secondary | ICD-10-CM

## 2013-02-01 DIAGNOSIS — R680 Hypothermia, not associated with low environmental temperature: Secondary | ICD-10-CM | POA: Diagnosis present

## 2013-02-01 HISTORY — DX: Acute myocardial infarction, unspecified: I21.9

## 2013-02-01 HISTORY — DX: Chronic obstructive pulmonary disease, unspecified: J44.9

## 2013-02-01 HISTORY — DX: Unspecified systolic (congestive) heart failure: I50.20

## 2013-02-01 LAB — CBC WITH DIFFERENTIAL/PLATELET
Basophils Relative: 1 % (ref 0–1)
Eosinophils Absolute: 0.3 10*3/uL (ref 0.0–0.7)
Eosinophils Relative: 3 % (ref 0–5)
Lymphs Abs: 1.6 10*3/uL (ref 0.7–4.0)
MCH: 28.7 pg (ref 26.0–34.0)
MCHC: 31 g/dL (ref 30.0–36.0)
MCV: 92.6 fL (ref 78.0–100.0)
Platelets: 288 10*3/uL (ref 150–400)
RBC: 3.38 MIL/uL — ABNORMAL LOW (ref 3.87–5.11)
RDW: 15.1 % (ref 11.5–15.5)

## 2013-02-01 LAB — URINALYSIS, ROUTINE W REFLEX MICROSCOPIC
Bilirubin Urine: NEGATIVE
Glucose, UA: NEGATIVE mg/dL
Ketones, ur: NEGATIVE mg/dL
Leukocytes, UA: NEGATIVE
Nitrite: POSITIVE — AB
Protein, ur: NEGATIVE mg/dL
Specific Gravity, Urine: 1.03 (ref 1.005–1.030)
Urobilinogen, UA: 0.2 mg/dL (ref 0.0–1.0)
pH: 5.5 (ref 5.0–8.0)

## 2013-02-01 LAB — COMPREHENSIVE METABOLIC PANEL
ALT: 13 U/L (ref 0–35)
Albumin: 2.9 g/dL — ABNORMAL LOW (ref 3.5–5.2)
Calcium: 10.2 mg/dL (ref 8.4–10.5)
Chloride: 121 mEq/L — ABNORMAL HIGH (ref 96–112)
GFR calc Af Amer: 52 mL/min — ABNORMAL LOW (ref >90)
Glucose, Bld: 126 mg/dL — ABNORMAL HIGH (ref 70–99)
Sodium: 164 mEq/L (ref 135–145)
Total Bilirubin: 0.3 mg/dL (ref 0.3–1.2)
Total Protein: 6.7 g/dL (ref 6.0–8.3)

## 2013-02-01 LAB — PROTIME-INR
INR: 1.18 (ref 0.00–1.49)
Prothrombin Time: 14.8 seconds (ref 11.6–15.2)

## 2013-02-01 LAB — TROPONIN I: Troponin I: <0.3 ng/mL (ref <0.30)

## 2013-02-01 MED ORDER — ACETAMINOPHEN 650 MG RE SUPP: 650.0000 mg | Freq: Four times a day (QID) | RECTAL | Status: DC | PRN

## 2013-02-01 MED ORDER — DEXTROSE 5 % IV SOLN
1.0000 g | Freq: Once | INTRAVENOUS | Status: AC
  Administered 2013-02-01: 1 g via INTRAVENOUS
  Filled 2013-02-01: qty 10

## 2013-02-01 MED ORDER — SODIUM CHLORIDE 0.9 % IJ SOLN
3.0000 mL | Freq: Two times a day (BID) | INTRAMUSCULAR | Status: DC
  Administered 2013-02-02: 3 mL via INTRAVENOUS

## 2013-02-01 MED ORDER — ENOXAPARIN SODIUM 40 MG/0.4ML ~~LOC~~ SOLN
40.0000 mg | SUBCUTANEOUS | Status: DC
  Administered 2013-02-02 – 2013-02-05 (×4): 40 mg via SUBCUTANEOUS
  Filled 2013-02-01 (×4): qty 0.4

## 2013-02-01 MED ORDER — METOPROLOL SUCCINATE ER 25 MG PO TB24: 25.0000 mg | ORAL_TABLET | Freq: Every day | ORAL | Status: DC

## 2013-02-01 MED ORDER — SODIUM CHLORIDE 0.9 % IV SOLN
INTRAVENOUS | Status: DC
  Administered 2013-02-01: 09:00:00 via INTRAVENOUS

## 2013-02-01 MED ORDER — HYDROCODONE-ACETAMINOPHEN 5-325 MG PO TABS: 1.0000 | ORAL_TABLET | ORAL | Status: DC | PRN

## 2013-02-01 MED ORDER — HYDROMORPHONE HCL PF 1 MG/ML IJ SOLN: 0.5000 mg | INTRAMUSCULAR | Status: DC | PRN

## 2013-02-01 MED ORDER — VANCOMYCIN HCL IN DEXTROSE 750-5 MG/150ML-% IV SOLN
750.0000 mg | INTRAVENOUS | Status: DC
  Administered 2013-02-01 – 2013-02-02 (×2): 750 mg via INTRAVENOUS
  Filled 2013-02-01 (×5): qty 150

## 2013-02-01 MED ORDER — DEXTROSE 5 % IV SOLN
1.0000 g | INTRAVENOUS | Status: DC
  Administered 2013-02-01 – 2013-02-02 (×2): 1 g via INTRAVENOUS
  Filled 2013-02-01 (×4): qty 1

## 2013-02-01 MED ORDER — CLOPIDOGREL BISULFATE 75 MG PO TABS: 75.0000 mg | ORAL_TABLET | Freq: Every day | ORAL | Status: DC

## 2013-02-01 MED ORDER — ACETAMINOPHEN 325 MG PO TABS: 650.0000 mg | ORAL_TABLET | Freq: Four times a day (QID) | ORAL | Status: DC | PRN

## 2013-02-01 MED ORDER — SODIUM CHLORIDE 0.9 % IV SOLN: INTRAVENOUS | Status: DC

## 2013-02-01 MED ORDER — SODIUM CHLORIDE 0.45 % IV SOLN
INTRAVENOUS | Status: DC
  Administered 2013-02-01: 12:00:00 via INTRAVENOUS

## 2013-02-01 MED ORDER — IRBESARTAN 75 MG PO TABS: 75.0000 mg | ORAL_TABLET | Freq: Every day | ORAL | Status: DC

## 2013-02-01 MED ORDER — ASPIRIN EC 81 MG PO TBEC: 81.0000 mg | DELAYED_RELEASE_TABLET | Freq: Every day | ORAL | Status: DC

## 2013-02-01 MED ORDER — SODIUM CHLORIDE 0.9 % IV BOLUS (SEPSIS)
250.0000 mL | Freq: Once | INTRAVENOUS | Status: AC
  Administered 2013-02-01: 250 mL via INTRAVENOUS

## 2013-02-01 NOTE — ED Notes (Signed)
Patient now has eyes opened and occasional squeezes hand. O2 sat 100% on nonrebreather.

## 2013-02-01 NOTE — ED Notes (Signed)
CRITICAL VALUE ALERT  Critical value received: sodium 164  Date of notification: 02/01/2013  Time of notification:  0916  Critical value read back:yes  Nurse who received alert:  c Sebastien Jackson rn  MD notified (1st page):  Dr Deretha Emory  Time of first page:    MD notified (2nd page):  Time of second page:  Responding MD:   Time MD responded:  [phone]

## 2013-02-01 NOTE — ED Provider Notes (Addendum)
History     This chart was scribed for [name] Jakes, MD, MD by Smitty Pluck, ED Scribe. The patient was seen in room APA06/APA06 and the patient's care was started at 8:29 AM.   CSN: [id number]  Arrival date & time 02/01/13  0815      Chief Complaint  Patient presents with  . Altered Mental Status    Pt is level 5 caveat due to being non-verbal.  Patient is a 79 y.o. female presenting with altered mental status. The history is provided by medical records and the EMS personnel.  Altered Mental Status Presenting symptoms: behavior changes and lethargy   Severity:  Unable to specify Most recent episode:  Today Episode history:  Unable to specify Timing:  Constant Progression:  Unchanged  HPI Comments: [name] is a 79 y.o. female with hx of dementia, CVA, MI and HTN who presents to the Emergency Department BIB EMS from [facility] complaining of altered mental status and being non-verbal today. Per EMS pt had MI 3 weeks ago.Staff reports that pt had increased weakness and facial drooping today. Per EMS pt's CBG was 114. Pt was admitted to [facility] ED for pulmonary edema on 01/18/13. Pt takes aricett for dementia.   PCP is Dr. Renard Matter    Past Medical History  Diagnosis Date  . CVA (cerebral vascular accident)   . Hypertension   . Dementia   . Osteoporosis   . MI (myocardial infarction)   . COPD (chronic obstructive pulmonary disease)     History reviewed. No pertinent past surgical history.  Family History  Problem Relation Age of Onset  . Heart failure Sister   . Heart attack Sister     History  Substance Use Topics  . Smoking status: Never Smoker   . Smokeless tobacco: Never Used  . Alcohol Use: No    OB History   Grav Para Term Preterm Abortions TAB SAB Ect Mult Living                  Review of Systems  Unable to perform ROS: Patient nonverbal  Psychiatric/Behavioral: Positive for altered mental status.    Allergies  Review of  patient's allergies indicates no known allergies.  Home Medications   Current Outpatient Rx  Name  Route  Sig  Dispense  Refill  . aspirin EC 81 MG tablet   Oral   Take 81 mg by mouth daily.           . calcium-vitamin D (OSCAL WITH D) 500-200 MG-UNIT per tablet   Oral   Take 1 tablet by mouth 3 (three) times daily.           . clopidogrel (PLAVIX) 75 MG tablet   Oral   Take 75 mg by mouth daily.           Marland Kitchen donepezil (ARICEPT) 10 MG tablet   Oral   Take 10 mg by mouth at bedtime.           . ferrous sulfate 300 (60 FE) MG/5ML syrup   Oral   Take 5 mLs (300 mg total) by mouth daily.   150 mL   3   . furosemide (LASIX) 40 MG tablet   Oral   Take 0.5 tablets (20 mg total) by mouth daily.   30 tablet   2   . irbesartan (AVAPRO) 75 MG tablet   Oral   Take 1 tablet (75 mg total) by mouth daily.  30 tablet   5   . memantine (NAMENDA) 10 MG tablet   Oral   Take 10 mg by mouth 2 (two) times daily.           . metoprolol succinate (TOPROL-XL) 25 MG 24 hr tablet   Oral   Take 25 mg by mouth daily.           . ranitidine (ZANTAC) 150 MG tablet   Oral   Take 150 mg by mouth 2 (two) times daily.             BP 119/48  Pulse 66  Ht 5\' 6"  (1.676 m)  Wt 130 lb (58.968 kg)  BMI 20.99 kg/m2  SpO2 98%  Physical Exam  Nursing note and vitals reviewed. Constitutional: She appears well-developed and well-nourished. No distress.  HENT:  Head: Normocephalic and atraumatic.  Eyes: Conjunctivae are normal. Pupils are equal, round, and reactive to light.  Neck: Neck supple.  Cardiovascular: Normal rate, regular rhythm and normal heart sounds.   No murmur heard. Pulmonary/Chest: Effort normal and breath sounds normal. No respiratory distress. She has no wheezes. She has no rales.  Abdominal: Soft. Bowel sounds are normal. She exhibits no distension. There is no tenderness. There is no rebound and no guarding.  Musculoskeletal: She exhibits no edema.  Moved  right hand but is not moving left hand Moved toes bilaterally   Lymphadenopathy:    She has no cervical adenopathy.  Neurological:  Right side facial droop  Skin: Skin is warm and dry.    ED Course  Procedures (including critical care time) DIAGNOSTIC STUDIES: Oxygen Saturation is 99% on non rebreather, normal by my interpretation.    COORDINATION OF CARE: 8:39 AM Devised treatment plan for pt.    Labs Reviewed  CBC WITH DIFFERENTIAL - Abnormal; Notable for the following:    RBC 3.38 (*)    Hemoglobin 9.7 (*)    HCT 31.3 (*)    Neutro Abs 7.9 (*)    All other components within normal limits  COMPREHENSIVE METABOLIC PANEL - Abnormal; Notable for the following:    Sodium 164 (*)    Chloride 121 (*)    CO2 39 (*)    Glucose, Bld 126 (*)    BUN 53 (*)    Albumin 2.9 (*)    GFR calc non Af Amer 44 (*)    GFR calc Af Amer 52 (*)    All other components within normal limits  URINALYSIS, ROUTINE W REFLEX MICROSCOPIC - Abnormal; Notable for the following:    Hgb urine dipstick TRACE (*)    Nitrite POSITIVE (*)    All other components within normal limits  PRO B NATRIURETIC PEPTIDE - Abnormal; Notable for the following:    Pro B Natriuretic peptide (BNP) 2669.0 (*)    All other components within normal limits  URINE MICROSCOPIC-ADD ON - Abnormal; Notable for the following:    Squamous Epithelial / LPF MANY (*)    Bacteria, UA MANY (*)    All other components within normal limits  TROPONIN I  PROTIME-INR   Ct Head Wo Contrast  02/01/2013   *RADIOLOGY REPORT*  Clinical Data: Altered mental status  CT HEAD WITHOUT CONTRAST  Technique:  Contiguous axial images were obtained from the base of the skull through the vertex without contrast.  Comparison: 08/30/2011  Findings: No evidence of parenchymal hemorrhage or extra-axial fluid collection. No mass lesion, mass effect, or midline shift.  No CT evidence of acute  infarction.  Old right cerebellar infarct (series 2/image 7).  Old  lacunar infarcts in the left corona radiata (series 2/image 22), bilateral basal ganglia, and left thalamus.  Extensive subcortical white matter and periventricular small vessel ischemic changes.  Intracranial atherosclerosis.  Global cortical and central atrophy.  Secondary ventriculomegaly.  The visualized paranasal sinuses are essentially clear. The mastoid air cells are unopacified.  No evidence of calvarial fracture.  IMPRESSION: No evidence of acute intracranial abnormality.  Multiple old cortical and lacunar infarcts, as described above.  Atrophy with extensive small vessel ischemic changes and intracranial atherosclerosis.   Original Report Authenticated By: Charline Bills, M.D.   Dg Chest Port 1 View  02/01/2013   *RADIOLOGY REPORT*  Clinical Data: Altered mental status, fatigue  PORTABLE CHEST - 1 VIEW  Comparison: 01/22/2013  Findings: Mild residual bilateral upper lobe opacities, left greater than right, improved. No pleural effusion or pneumothorax.  Mild cardiomegaly.  Stable sclerosis involving the left scapula.  IMPRESSION: Mild residual bilateral upper lobe pneumonia, left greater than right, improved.   Original Report Authenticated By: Charline Bills, M.D.   Results for orders placed during the hospital encounter of 02/01/13  CBC WITH DIFFERENTIAL      Result Value Range   WBC 10.5  4.0 - 10.5 K/uL   RBC 3.38 (*) 3.87 - 5.11 MIL/uL   Hemoglobin 9.7 (*) 12.0 - 15.0 g/dL   HCT 81.1 (*) 91.4 - 78.2 %   MCV 92.6  78.0 - 100.0 fL   MCH 28.7  26.0 - 34.0 pg   MCHC 31.0  30.0 - 36.0 g/dL   RDW 95.6  21.3 - 08.6 %   Platelets 288  150 - 400 K/uL   Neutrophils Relative % 75  43 - 77 %   Neutro Abs 7.9 (*) 1.7 - 7.7 K/uL   Lymphocytes Relative 15  12 - 46 %   Lymphs Abs 1.6  0.7 - 4.0 K/uL   Monocytes Relative 7  3 - 12 %   Monocytes Absolute 0.7  0.1 - 1.0 K/uL   Eosinophils Relative 3  0 - 5 %   Eosinophils Absolute 0.3  0.0 - 0.7 K/uL   Basophils Relative 1  0 - 1 %    Basophils Absolute 0.1  0.0 - 0.1 K/uL  COMPREHENSIVE METABOLIC PANEL      Result Value Range   Sodium 164 (*) 135 - 145 mEq/L   Potassium 3.7  3.5 - 5.1 mEq/L   Chloride 121 (*) 96 - 112 mEq/L   CO2 39 (*) 19 - 32 mEq/L   Glucose, Bld 126 (*) 70 - 99 mg/dL   BUN 53 (*) 6 - 23 mg/dL   Creatinine, Ser 5.78  0.50 - 1.10 mg/dL   Calcium 46.9  8.4 - 62.9 mg/dL   Total Protein 6.7  6.0 - 8.3 g/dL   Albumin 2.9 (*) 3.5 - 5.2 g/dL   AST 22  0 - 37 U/L   ALT 13  0 - 35 U/L   Alkaline Phosphatase 73  39 - 117 U/L   Total Bilirubin 0.3  0.3 - 1.2 mg/dL   GFR calc non Af Amer 44 (*) >90 mL/min   GFR calc Af Amer 52 (*) >90 mL/min  TROPONIN I      Result Value Range   Troponin I <0.30  <0.30 ng/mL  URINALYSIS, ROUTINE W REFLEX MICROSCOPIC      Result Value Range   Color, Urine YELLOW  YELLOW  APPearance CLEAR  CLEAR   Specific Gravity, Urine 1.030  1.005 - 1.030   pH 5.5  5.0 - 8.0   Glucose, UA NEGATIVE  NEGATIVE mg/dL   Hgb urine dipstick TRACE (*) NEGATIVE   Bilirubin Urine NEGATIVE  NEGATIVE   Ketones, ur NEGATIVE  NEGATIVE mg/dL   Protein, ur NEGATIVE  NEGATIVE mg/dL   Urobilinogen, UA 0.2  0.0 - 1.0 mg/dL   Nitrite POSITIVE (*) NEGATIVE   Leukocytes, UA NEGATIVE  NEGATIVE  PROTIME-INR      Result Value Range   Prothrombin Time 14.8  11.6 - 15.2 seconds   INR 1.18  0.00 - 1.49  PRO B NATRIURETIC PEPTIDE      Result Value Range   Pro B Natriuretic peptide (BNP) 2669.0 (*) 0 - 450 pg/mL  URINE MICROSCOPIC-ADD ON      Result Value Range   Squamous Epithelial / LPF MANY (*) RARE   WBC, UA 0-2  <3 WBC/hpf   RBC / HPF 0-2  <3 RBC/hpf   Bacteria, UA MANY (*) RARE      Date: 02/01/2013  Rate: 73  Rhythm: normal sinus rhythm and premature atrial contractions (PAC)  QRS Axis: normal  Intervals: normal  ST/T Wave abnormalities: nonspecific ST/T changes  Conduction Disutrbances:left bundle branch block  Narrative Interpretation:   Old EKG Reviewed: unchanged EKG is  unchanged from Jan 18 2013  1. Altered mental state   2. CVA (cerebral vascular accident)   3. Acute hypernatremia   4. Urinary tract infection       MDM  The patient presented with appeared to be potential strokelike symptoms. With the really no movement of the left upper chest remedy minimal movement of the left lower extremity. Facial droop appeared to be on the right side. Concerns were for her CVA but may all be related to the severe hypenatremia. With normal renal function.  For the hypernatremia the patient was given 250 cc bolus of normal saline and continue to rate of 75 cc an hour had to do this at a low rate in the low bolus due to her propensity for pulmonary edema BNP significantly elevated. Chest x-ray without evidence of distinct pulmonary edema currently. Suspect that patient has become somewhat dehydrated her BUN is also elevated creatinine is normal she is on Lasix that was just started on the 19th and this may have resulted in the worsening electrolytes.  Patient will require gentle and gradual rehydration to bring the sodium under control. Also patient's urine had positive nitrites was started on Rocephin for presumed urinary tract infection. Urine culture is pending. Patient's chest x-ray raises concerns for improving upper lobe pneumonia do not feel there is any acute process there.     I personally performed the services described in this documentation, which was scribed in my presence. The recorded information has been reviewed and is accurate.        [name] Jakes, MD 02/01/13 1037   Addendum: The patient just had the 2 apneic spells lasting about 30 seconds each. She is now back to her baseline mental status that we saw when she first arrived. Review of her last admission from the 13th shows that she was made a DO NOT RESUSCITATE no nursing home paper but is very clear from the admission they discussed with the son and the patient was to be a DO NOT  RESUSCITATE. Based on that we will not intubate her for these apneic spells. The admitting team is  aware. Would recommend keeping her on 100% nonrebreather admission to telemetry is still appropriate. Patient is breathing predominantly through her mouth some nasal cannula oxygen would not be helpful.   Also placing the patient on BiPAP would also not be helpful. On the 100% nonrebreather the patient is satting 100%. She does not have a history of COPD so the extra oxygen will not be harmful at this point.     [name] Jakes, MD 02/01/13 1057

## 2013-02-01 NOTE — H&P (Signed)
[facility] History and Physical  ANALESE SOVINE WUJ:[id number] DOB: [birth year] DOA: 02/01/2013  Referring physician:  PCP: Alice Reichert, MD  Specialists:   Chief Complaint:   HPI: [name] is a 79 y.o. female pmhx including CVA, HTN, COPD, dementia, recent NSTEMI with systolic HF presents to ED from nursing home with chief complaint of alerted mental status. Information obtained from documentation as pt unable to give answers due to dementia and acute illness. Pt is from [facility]. Staff there reports the patient's unresponsiveness, altered mental status and decreased po intake over last day or so. Also some report of worsening generalized weakness and facial drooping. No reports of fever, vomiting, or diarrhea. No reports of complaints of pain. It is reported that pt has been on a non-rebreather at the ALF.  Of note, pt admitted 5/13 with pulmonary edema related to NSTEMI. She was evaluated by cardiology. The decision was to treat her medically as documented. She is a DNR. Work up in ED yields sodium 164, chloride 121, and a urinalysis that revealed many bacteria, many squamous cells, and positive nitrite. Her chest x-ray revealed mild residual bilateral upper lobe pneumonia left greater than right "improved". CT scan of the head revealed no evidence of acute intracranial abnormality, but multiple old cortical and lacunar infarcts are noted. Her vital signs are significant for a temperature of 94.2-96.5. Her blood pressure is within normal limits. Her oxygen saturation was initially 81%, but improved to 90% on oxygen. On exam in ED, the patient was experiencing episodes of apnea. She is being admitted for further evaluation and management.    Review of Systems:   Past Medical History  Diagnosis Date  . CVA (cerebral vascular accident)   . Hypertension   . Dementia   . Osteoporosis   . MI (myocardial infarction)   . COPD (chronic obstructive pulmonary disease)   .  Systolic HF (heart failure)     5/14   History reviewed. No pertinent past surgical history. Social History:  reports that she has never smoked. She has never used smokeless tobacco. She reports that she does not drink alcohol or use illicit drugs. Lives in facility with severe dementia. Total care  No Known Allergies  Family History  Problem Relation Age of Onset  . Heart failure Sister   . Heart attack Sister      Prior to Admission medications   Medication Sig Start Date End Date Taking? Authorizing Provider  aspirin EC 81 MG tablet Take 81 mg by mouth daily.     Yes Historical Provider, MD  calcium-vitamin D (OSCAL WITH D) 500-200 MG-UNIT per tablet Take 1 tablet by mouth 3 (three) times daily.     Yes Historical Provider, MD  clopidogrel (PLAVIX) 75 MG tablet Take 75 mg by mouth daily.     Yes Historical Provider, MD  donepezil (ARICEPT) 10 MG tablet Take 10 mg by mouth at bedtime.     Yes Historical Provider, MD  ferrous sulfate 300 (60 FE) MG/5ML syrup Take 5 mLs (300 mg total) by mouth daily. 01/24/13  Yes Angus Edilia Bo, MD  furosemide (LASIX) 40 MG tablet Take 0.5 tablets (20 mg total) by mouth daily. 01/24/13  Yes Angus Edilia Bo, MD  irbesartan (AVAPRO) 75 MG tablet Take 1 tablet (75 mg total) by mouth daily. 01/24/13  Yes Angus Edilia Bo, MD  memantine (NAMENDA) 10 MG tablet Take 10 mg by mouth 2 (two) times daily.     Yes Historical Provider,  MD  metoprolol succinate (TOPROL-XL) 25 MG 24 hr tablet Take 25 mg by mouth daily.     Yes Historical Provider, MD  ranitidine (ZANTAC) 150 MG tablet Take 150 mg by mouth 2 (two) times daily.     Yes Historical Provider, MD   Physical Exam: Filed Vitals:   02/01/13 4540 02/01/13 0901 02/01/13 1015 02/01/13 1044  BP: 119/48  131/59 119/64  Pulse: 66  66 65  Temp:  96.5 F (35.8 C)    TempSrc:  Rectal    Resp:   24 22  Height: 5\' 6"  (1.676 m)     Weight: 58.968 kg (130 lb)     SpO2: 98%  99% 96%     General:  Cachectic, pale    Eyes: PERRL no scleral icterus   ENT: ears clear, nose without drainage, oropharynx pink with very dry mucus membranes  Neck: supple no JVD no lymphadenopathy  Cardiovascular: RRR no MGR, no LE edema  Respiratory: normal effort but shallow with periods of apnea. BS clear bilaterally no wheeze  Abdomen: soft +BS non-tender to palpation no mass or organomegaly no guarding  Skin: pale dry, no rash or lesions  Musculoskeletal: Mild cyanosis of her fingertips  Psychiatric: unable to assess  Neurologic: will follow simple commands. Right grip 3/5 left grip 2/5. Right facial droop.   Labs on Admission:  Basic Metabolic Panel:  Recent Labs Lab 02/01/13 0841  NA 164*  K 3.7  CL 121*  CO2 39*  GLUCOSE 126*  BUN 53*  CREATININE 1.06  CALCIUM 10.2   Liver Function Tests:  Recent Labs Lab 02/01/13 0841  AST 22  ALT 13  ALKPHOS 73  BILITOT 0.3  PROT 6.7  ALBUMIN 2.9*   No results found for this basename: LIPASE, AMYLASE,  in the last 168 hours No results found for this basename: AMMONIA,  in the last 168 hours CBC:  Recent Labs Lab 02/01/13 0841  WBC 10.5  NEUTROABS 7.9*  HGB 9.7*  HCT 31.3*  MCV 92.6  PLT 288   Cardiac Enzymes:  Recent Labs Lab 02/01/13 0841  TROPONINI <0.30    BNP (last 3 results)  Recent Labs  01/18/13 0621 02/01/13 0841  PROBNP 4525.0* 2669.0*   CBG: No results found for this basename: GLUCAP,  in the last 168 hours  Radiological Exams on Admission: Ct Head Wo Contrast  02/01/2013   *RADIOLOGY REPORT*  Clinical Data: Altered mental status  CT HEAD WITHOUT CONTRAST  Technique:  Contiguous axial images were obtained from the base of the skull through the vertex without contrast.  Comparison: 08/30/2011  Findings: No evidence of parenchymal hemorrhage or extra-axial fluid collection. No mass lesion, mass effect, or midline shift.  No CT evidence of acute infarction.  Old right cerebellar infarct (series 2/image 7).  Old lacunar  infarcts in the left corona radiata (series 2/image 22), bilateral basal ganglia, and left thalamus.  Extensive subcortical white matter and periventricular small vessel ischemic changes.  Intracranial atherosclerosis.  Global cortical and central atrophy.  Secondary ventriculomegaly.  The visualized paranasal sinuses are essentially clear. The mastoid air cells are unopacified.  No evidence of calvarial fracture.  IMPRESSION: No evidence of acute intracranial abnormality.  Multiple old cortical and lacunar infarcts, as described above.  Atrophy with extensive small vessel ischemic changes and intracranial atherosclerosis.   Original Report Authenticated By: Charline Bills, M.D.   Dg Chest Port 1 View  02/01/2013   *RADIOLOGY REPORT*  Clinical Data: Altered mental status,  fatigue  PORTABLE CHEST - 1 VIEW  Comparison: 01/22/2013  Findings: Mild residual bilateral upper lobe opacities, left greater than right, improved. No pleural effusion or pneumothorax.  Mild cardiomegaly.  Stable sclerosis involving the left scapula.  IMPRESSION: Mild residual bilateral upper lobe pneumonia, left greater than right, improved.   Original Report Authenticated By: Charline Bills, M.D.    EKG: Independently reviewed.   Assessment/Plan Principal Problem: HCAP (healthcare-associated pneumonia):  Chest xray shows residual upper lobe pneumonia with some improvement. Given patient's severe dehydration there is concern xray may worsen once hydrated. Will start cefipime and vanc per pharmacy.  Provide supportive care via oxygen supplementation.   Hypothermia: Etiology likely multifactorial including early sepsis, pneumonia, and dehydration.    Acute-on-chronic respiratory failure: likely related to HCAP in setting of recent NSTEMI and systolic HF. Facility reports pt on non-rebreather at facility for last day. Usually on . Sats >96% on oxygen supplementation with mask. Antibiotics as above.      Hypernatremia: likely  related to decreased po intake in setting of #1 and #2. In addition, pt started on lasix 2 weeks ago. Renal function within normal limits. Will hydrate very gently. Will recheck bmet at 6pm and in am. Strict intake and output.     Dehydration: related to all of above. Will gently hydrate and monitor.  Encephalopathy: Secondary to hypernatremia, hypoxia, dehydration, and sections, an underlying dementia. Will treat problems as indicated and monitor. CT head without evidence of acute intracranial abnormality.     Apnea: Pt having frequent episodes apnea in ED. Spoke with son Leonette Most who is health care POA. He confirms pt is DNR and pt is not to be intubated.    UTI:  Urine culture will be obtained. Antibiotics as above.      Chronic systolic CHF (congestive heart failure): hx acute systolic HF 2 weeks ago: echo at that time yields 35-40% EF. ProBNP 2669 which is lower than on admission 2 weeks ago. No signs volume overload. Will monitor daily weight and strict intake and output.     Essential hypertension: controlled currently.     Dementia: see # 5    COPD (chronic obstructive pulmonary disease): see #1. No wheeze on exam. Not on inhaler from home. Monitor.      Code Status: DNR Family Communication: Son Hydrologist by phone Disposition Plan:   Time spent: 60 minutes  [facility]   If 7PM-7AM, please contact night-coverage [email or web address] Password [facility] 02/01/2013, 11:14 AM    Attending: Patient was seen and examined. She was discussed with NP, Ms. Vedia Coffer. The above note has been either annotated or amended. The patient's prognosis is poor. This was discussed with her son. The patient may not survive this hospitalization. It is reasonable to continue supportive treatment and broad-spectrum antibiotics. Provide oxygen to keep her oxygen saturations greater than 90%. Because she is encephalopathic and virtually unresponsive, we'll make her n.p.o. If she improves with  hydration, her oral medications and diet can be restarted.

## 2013-02-01 NOTE — Progress Notes (Signed)
ANTIBIOTIC CONSULT NOTE - INITIAL  Pharmacy Consult for Vancomycin & Cefepime Indication: pneumonia  No Known Allergies  Patient Measurements: Height: 5\' 6"  (167.6 cm) Weight: 130 lb (58.968 kg) IBW/kg (Calculated) : 59.3  Vital Signs: Temp: 94.2 F (34.6 C) (05/27 1151) Temp src: Axillary (05/27 1151) BP: 115/55 mmHg (05/27 1151) Pulse Rate: 58 (05/27 1151) Intake/Output from previous day:   Intake/Output from this shift:    Labs:  Recent Labs  02/01/13 0841  WBC 10.5  HGB 9.7*  PLT 288  CREATININE 1.06   Estimated Creatinine Clearance: 32.2 ml/min (by C-G formula based on Cr of 1.06). No results found for this basename: VANCOTROUGH, Leodis Binet, VANCORANDOM, GENTTROUGH, GENTPEAK, GENTRANDOM, TOBRATROUGH, TOBRAPEAK, TOBRARND, AMIKACINPEAK, AMIKACINTROU, AMIKACIN,  in the last 72 hours   Microbiology: Recent Results (from the past 720 hour(s))  MRSA PCR SCREENING     Status: None   Collection Time    01/18/13  8:24 AM      Result Value Range Status   MRSA by PCR NEGATIVE  NEGATIVE Final   Comment:            The GeneXpert MRSA Assay (FDA     approved for NASAL specimens     only), is one component of a     comprehensive MRSA colonization     surveillance program. It is not     intended to diagnose MRSA     infection nor to guide or     monitor treatment for     MRSA infections.    Medical History: Past Medical History  Diagnosis Date  . CVA (cerebral vascular accident)   . Hypertension   . Dementia   . Osteoporosis   . MI (myocardial infarction)   . COPD (chronic obstructive pulmonary disease)   . Systolic HF (heart failure)     5/14    Medications:  Scheduled:  . aspirin EC  81 mg Oral Daily  . ceFEPime (MAXIPIME) IV  1 g Intravenous Q24H  . clopidogrel  75 mg Oral Daily  . enoxaparin (LOVENOX) injection  40 mg Subcutaneous Q24H  . irbesartan  75 mg Oral Daily  . metoprolol succinate  25 mg Oral Daily  . sodium chloride  3 mL Intravenous Q12H   . vancomycin  750 mg Intravenous Q24H   Assessment: 79 yo F admitted from NH with AMS and PNA per CXR.  She received Rocephin 1gm in ED but antibiotics are being broadened to Vancomycin & Cefepime.  Renal function appears to be at patient's baseline.   Goal of Therapy:  Vancomycin trough level 15-20 mcg/ml  Plan:  1) Cefepime 1gm IV Q24h 2) Vancomycin 750mg  IV Q24h 3) Check Vancomycin trough at steady state 4) Monitor renal function and cx data   Elson Clan 02/01/2013,12:11 PM

## 2013-02-01 NOTE — ED Notes (Signed)
Patient brought in via EMS from [facility] with altered mental status. Per EMs patient suffered MI 3 weeks ago. Patient discovered this morning to be nonverbal with increased weakness and facial drooping. Patient non-verbal but can follow commands. Blood glucose per EMS 114. EMS reports 12 lead showing occasional PVCs.

## 2013-02-01 NOTE — ED Notes (Signed)
Patient had 30 second period of apnea followed by another period of apnea lasting 45 seconds. Patient less responsive, opens eyes briefly after sternal rub. Dr Deretha Emory aware and in room reassessing patient.

## 2013-02-02 DIAGNOSIS — E870 Hyperosmolality and hypernatremia: Secondary | ICD-10-CM

## 2013-02-02 DIAGNOSIS — I5040 Unspecified combined systolic (congestive) and diastolic (congestive) heart failure: Secondary | ICD-10-CM

## 2013-02-02 DIAGNOSIS — R4182 Altered mental status, unspecified: Secondary | ICD-10-CM

## 2013-02-02 LAB — BASIC METABOLIC PANEL
BUN: 43 mg/dL — ABNORMAL HIGH (ref 6–23)
Chloride: 116 mEq/L — ABNORMAL HIGH (ref 96–112)
GFR calc Af Amer: 63 mL/min — ABNORMAL LOW (ref >90)
GFR calc Af Amer: 62 mL/min — ABNORMAL LOW (ref >90)
GFR calc non Af Amer: 54 mL/min — ABNORMAL LOW (ref >90)
Potassium: 3.6 mEq/L (ref 3.5–5.1)
Potassium: 3.3 mEq/L — ABNORMAL LOW (ref 3.5–5.1)
Sodium: 161 mEq/L (ref 135–145)
Sodium: 156 mEq/L — ABNORMAL HIGH (ref 135–145)

## 2013-02-02 LAB — CBC
Hemoglobin: 9.6 g/dL — ABNORMAL LOW (ref 12.0–15.0)
MCHC: 30 g/dL (ref 30.0–36.0)
RBC: 3.4 MIL/uL — ABNORMAL LOW (ref 3.87–5.11)

## 2013-02-02 LAB — NA AND K (SODIUM & POTASSIUM), RAND UR: Potassium Urine: 71 mEq/L

## 2013-02-02 MED ORDER — FUROSEMIDE 10 MG/ML IJ SOLN
20.0000 mg | Freq: Every day | INTRAMUSCULAR | Status: DC
  Administered 2013-02-02: 20 mg via INTRAVENOUS
  Filled 2013-02-02: qty 2

## 2013-02-02 MED ORDER — DEXTROSE 5 % IV SOLN
INTRAVENOUS | Status: DC
  Administered 2013-02-02: 1 mL via INTRAVENOUS
  Administered 2013-02-03: 05:00:00 via INTRAVENOUS

## 2013-02-02 NOTE — Care Management Note (Unsigned)
    Page 1 of 2   02/04/2013     4:25:33 PM   CARE MANAGEMENT NOTE 02/04/2013  Patient:  [name], [name]   Account Number:  [id number]  Date Initiated:  02/02/2013  Documentation initiated by:  Sharrie Rothman  Subjective/Objective Assessment:   Pt admitted from [facility] with pneumonia and respiratory failure. Pt may need higher level of care at discharge.     Action/Plan:   CSW will arrange discharge to facility when medically stable.   Anticipated DC Date:  02/05/2013   Anticipated DC Plan:  ASSISTED LIVING / REST HOME  In-house referral  Clinical Social Worker      DC Planning Services  CM consult      PAC Choice  HOSPICE   Choice offered to / List presented to:  C-4 Adult Children           Status of service:  In process, will continue to follow Medicare Important Message given?   (If response is "NO", the following Medicare IM given date fields will be blank) Date Medicare IM given:   Date Additional Medicare IM given:    Discharge Disposition:    Per UR Regulation:  Reviewed for med. necessity/level of care/duration of stay  If discussed at Long Length of Stay Meetings, dates discussed:    Comments:  02/04/13 1100-1600 Anibal Henderson RN/CM Pt has a Hospice referral and son has talked with [facility], and has chosen them for care at [employer], but [employer] can only take pt back if she can wean off venti-mask- per RN pt has weaned to 4L [place]. CSW spoke with  Dr Renard Matter and pt can D/C to [facility] tomorrow with Hospice. Spoke with Hospice and they will admit over the weekend, and need orders for O2 and hospital bed, which were gotten from Dr Renard Matter and faxed to [facility]. Then per Thurston Hole, CSW, Marchelle Folks at [facility] says they cannot get pt's current provider of O2 and bed to remove their DME until next week. Hospice cannot use the other providers DME. After several calls to [facility], they cannot resolve this issue. Spoke with Dr.  Renard Matter and he is OK with pt returning to [facility] on Saturday as a DNR, with Hospice admitting on Monday, and reminding them no need to send pt back to the hospital, as she is a hospice, DNR patient. Spoke with Marchelle Folks about this and she is  in agreement with this. Notified Thurston Hole, CSW. 02/02/13 1435 Arlyss Queen, RN BSN CM

## 2013-02-02 NOTE — Progress Notes (Signed)
CRITICAL VALUE ALERT  Critical value received:  Sodium 161  Date of notification:  02/02/13  Time of notification:  0655  Critical value read back:yes  Nurse who received alert:  Linward Natal, RN  MD notified (1st page):  [place]  Time of first page:  0700  MD notified (2nd page):  Time of second page:  Responding MD:  Renard Matter  Time MD responded:  [phone]  New orders given

## 2013-02-02 NOTE — Progress Notes (Signed)
NAMENIANNA, IGO              ACCOUNT NO.:  [id number]  MEDICAL RECORD NO.:  [id number]  LOCATION:  A217                          FACILITY:  [facility]  PHYSICIAN:  Batsheva Stevick G. Renard Matter, MD   DATE OF BIRTH:  [birth year]  DATE OF PROCEDURE: DATE OF DISCHARGE:                                PROGRESS NOTE   SUBJECTIVE:  This patient presented to the emergency room from nursing home with altered mental status and episodes of apnea.  She does have prior history of CVA, hypertension, COPD, dementia, and NSTEMI with systolic heart failure and previous pulmonary edema.  She did have a low oxygen on admission and positive UA.  She was lethargic but responds. She has D and A.  OBJECTIVE:  VITAL SIGNS:  Blood pressure 110/46, respirations 14, pulse 61, temp 98.3. Her hemoglobin low at 9.7 with hematocrit 31.3.  A chest x-ray showed mild residual upper lobe pneumonia, greater on the left than right, but improved. HEENT:  Eyes, PERRLA.  TM negative.  Oropharynx benign. NECK:  Supple.  No JVD or thyroid abnormalities. HEART:  Regular rhythm.  No murmurs. LUNGS:  Decreased breath sounds.  Clear bilaterally. ABDOMEN:  No palpable organs or masses. SKIN:  Dry.  No edema.  LABORATORY DATA:  ProBNP 4525 on Jan 18, 2013; on Feb 01, 2013, 2669.  ASSESSMENT:  The patient is admitted with what is felt to be healthcare- associated pneumonia, dehydration, hypothermia, acute on chronic respiratory failure, systolic heart failure with 35-40% ejection fraction and proBNP 2669, dementia, essential hypertension, chronic obstructive pulmonary disease.  PLAN:  To continue hydration, continue supportive treatment with broad- spectrum antibiotics.  I will obtain Cardiology consult.     Aaliayah Miao G. Renard Matter, MD     AGM/MEDQ  D:  02/02/2013  T:  02/02/2013  Job:  [id number]

## 2013-02-02 NOTE — Progress Notes (Signed)
INITIAL NUTRITION ASSESSMENT  DOCUMENTATION CODES Per approved criteria  -Not Applicable   INTERVENTION: RD will follow for diet advancement   NUTRITION DIAGNOSIS: Inadequate oral intake related to altered mental status, unresponsive AEB severe dehydration, hypothermia on admission and decreased po intake prior to admission  Goal: Pt to meet >/= 90% of their estimated nutrition needs  Monitor:  Tolerance and percentage of po intake, labs, I/O's and wt trends  Reason for Assessment: Low Braden Score =11  79 y.o. female  Admitting Dx include: PNA, dehydration, hypothermia, acute on chronic respiratory failure  ASSESSMENT: Pt from [place]. Altered mental status, hx of dementia and severely dehydrated on admission.  Assessed by RD on 5/14 during her hospitalization due to acute CHF. Wt at that time 134#. Current wt reflects 5#(2.2 kg), 3.7% x 14 days.  Previously receiving Pureed/Heart Healthy diet with Nectar-thick liquids. Currently NPO will follow for diet advancement and add oral nutrition as indicated and diet Rx allows.  Height: Ht Readings from Last 1 Encounters:  02/01/13 5\' 6"  (1.676 m)    Weight: Wt Readings from Last 1 Encounters:  02/02/13 139 lb 5.3 oz (63.2 kg)    Ideal Body Weight: 130# (59 kg)  % Ideal Body Weight: 107%  Wt Readings from Last 10 Encounters:  02/02/13 139 lb 5.3 oz (63.2 kg)  01/24/13 130 lb 8.2 oz (59.2 kg)    Usual Body Weight: 134#  01/19/13  BMI:  Body mass index is 22.5 kg/(m^2).Normal range  Estimated Nutritional Needs: Kcal: 1350-1575 Protein: 60-70 gr Fluid: per MD goals  Skin: No issues noted  Diet Order:    EDUCATION NEEDS: -Education not appropriate at this time   Intake/Output Summary (Last 24 hours) at 02/02/13 0941 Last data filed at 02/02/13 0800  Gross per 24 hour  Intake 979.17 ml  Output    300 ml  Net 679.17 ml    Last BM: PTA  Labs:   Recent Labs Lab 02/01/13 0841 02/02/13 0600  NA  164* 161*  K 3.7 3.6  CL 121* 124*  CO2 39* 35*  BUN 53* 46*  CREATININE 1.06 0.90  CALCIUM 10.2 9.3  GLUCOSE 126* 118*    CBG (last 3)  No results found for this basename: GLUCAP,  in the last 72 hours  Scheduled Meds: . ceFEPime (MAXIPIME) IV  1 g Intravenous Q24H  . clopidogrel  75 mg Oral Daily  . enoxaparin (LOVENOX) injection  40 mg Subcutaneous Q24H  . furosemide  20 mg Intravenous Daily  . sodium chloride  3 mL Intravenous Q12H  . vancomycin  750 mg Intravenous Q24H    Continuous Infusions: . sodium chloride 10 mL/hr at 02/02/13 1610    Past Medical History  Diagnosis Date  . CVA (cerebral vascular accident)   . Hypertension   . Dementia   . Osteoporosis   . MI (myocardial infarction)   . COPD (chronic obstructive pulmonary disease)   . Systolic HF (heart failure)     5/14    History reviewed. No pertinent past surgical history.  Royann Shivers MS,RD,LDN,CSG Office: [phone] Pager: [phone]

## 2013-02-02 NOTE — Clinical Social Work Psychosocial (Signed)
    Clinical Social Work Department BRIEF PSYCHOSOCIAL ASSESSMENT 02/02/2013  Patient:  [name], [name]     Account Number:  [id number]     Admit date:  02/01/2013  Clinical Social Worker:  Santa Genera, CLINICAL SOCIAL WORKER  Date/Time:  02/02/2013 10:30 AM  Referred by:  RN  Date Referred:  02/02/2013 Referred for  ALF Placement   Other Referral:   Interview type:  Other - See comment Other interview type:   Spoke w Marchelle Folks, [facility] staff    PSYCHOSOCIAL DATA Living Status:  FACILITY Admitted from facility:   HOUSE OF [place] Level of care:  Assisted Living Primary support name:  [name] Primary support relationship to patient:  CHILD, ADULT Degree of support available:   Adequate family support, lives in facility    CURRENT CONCERNS Current Concerns  Post-Acute Placement   Other Concerns:    SOCIAL WORK ASSESSMENT / PLAN CSW could not assess patient directly due to lack of responsiveness and history of dementia.  CSW spoke w son [name] who is primary family contact and currently is healthcare POA for patient.  [name] confirms that he would like patient to return to [facility] if possible, but is aware that patient is quite ill and may need a higher level of care at discharge.    CSW spoke w Marchelle Folks, Nurse, children's at [facility] where patient lives.  Marchelle Folks is very familiar w patient as she has been a long term resident of their facility.  Per Marchelle Folks, patient has not been on a nonrebreather at [facility].  Facility requires special equipment and administrative permission to have patient on nonbreather. Private duty CNAs caring for patient at [facility] have requested nonrebreather, but facility has not been able to accommodate that request as yet.  Over the weekend, patient was lethargic and nonresponsive, staff that have visited patient at [facility] say she is more responsive as she "opened her eyes" and "tried to smile."    Facility staff will  come to [facility] to assess patient this afternoon to determine whether they can take patient back at discharge.  Unsure whether they can meet her needs at this time.    CSW will continue to monitor and facilitate discharge planning as appropriate.   Assessment/plan status:  Psychosocial Support/Ongoing Assessment of Needs Other assessment/ plan:   Information/referral to community resources:   None needed at this time    PATIENT'S/FAMILY'S RESPONSE TO PLAN OF CARE: CSW encouraged patient's son to communicate w MD.  Son appreciative of contact w CSW.     Santa Genera, LCSW Clinical Social Worker [phone])

## 2013-02-02 NOTE — Progress Notes (Signed)
UR Chart Review Completed  

## 2013-02-02 NOTE — Consult Note (Signed)
CARDIOLOGY CONSULT NOTE  Patient ID: [name] MRN: [id number] DOB/AGE: [birth year] 79 y.o.  Admit date: 02/01/2013 Referring Physician: Butch Penny Primary PhysicianMCINNIS,ANGUS G, MD Primary Cardiologist: [place] Bing Reason for Consultation: Recurrent CHF with known systolic dysfunction  Principal Problem:   Acute-on-chronic respiratory failure Active Problems:   CHF (congestive heart failure)   Essential hypertension   Dementia   COPD (chronic obstructive pulmonary disease)   Encephalopathy   HCAP (healthcare-associated pneumonia)   Hypernatremia   Anemia   Dehydration   Apnea   Hypothermia   Sepsis  HPI: [name] is a 79 year old patient with known history of systolic dysfunction EF 35%-40%, shortness of breath , unresponsiveness, dementia, FTT, and poor oral intake. We evaluated and treated this patient during a recent admission 1 week ago for congestive heart failure and pneumonia. Family members have elected DO NOT RESUSCITATE status. She was treated low-dose oral diuretic, antibiotics, Plavix, aspirin, Avapro and metoprolol.  We are asked to reassess patient, who is now readmitted with impaired mental status and hypernatremia.      On arrival to ER, serum sodium was 164, chloride 121, creatinine 1.06 . UA showed many bacteria but no other evidence for infection. Troponin was negative, pro BNP 2669 (compared to 2 weeks ago at 4525). Chest x-ray revealed mild residual bilateral upper lobe pneumonia left greater than right. She was treated in the ER with Rocephin, and IV fluid hydration with sodium chloride.       Pt is unresponsive, and history is obtained from current and past medical records.    Review of systems complete and found to be negative unless listed above   Past Medical History  Diagnosis Date  . CVA (cerebral vascular accident)   . Hypertension   . Dementia   . Osteoporosis   . MI (myocardial infarction)   . COPD (chronic obstructive  pulmonary disease)   . Systolic HF (heart failure)     5/14    Family History  Problem Relation Age of Onset  . Heart failure Sister   . Heart attack Sister     History   Social History  . Marital Status: Widowed    Spouse Name: N/A    Number of Children: N/A  . Years of Education: N/A   Occupational History  . Not on file.   Social History Main Topics  . Smoking status: Never Smoker   . Smokeless tobacco: Never Used  . Alcohol Use: No  . Drug Use: No  . Sexually Active: Not on file   Other Topics Concern  . Not on file   Social History Narrative  . No narrative on file    History reviewed. No pertinent past surgical history.   Prescriptions prior to admission  Medication Sig Dispense Refill  . aspirin EC 81 MG tablet Take 81 mg by mouth daily.        . calcium-vitamin D (OSCAL WITH D) 500-200 MG-UNIT per tablet Take 1 tablet by mouth 3 (three) times daily.        . clopidogrel (PLAVIX) 75 MG tablet Take 75 mg by mouth daily.        Marland Kitchen donepezil (ARICEPT) 10 MG tablet Take 10 mg by mouth at bedtime.        . ferrous sulfate 300 (60 FE) MG/5ML syrup Take 5 mLs (300 mg total) by mouth daily.  150 mL  3  . furosemide (LASIX) 40 MG tablet Take 0.5 tablets (20 mg total) by mouth daily.  30 tablet  2  . irbesartan (AVAPRO) 75 MG tablet Take 1 tablet (75 mg total) by mouth daily.  30 tablet  5  . memantine (NAMENDA) 10 MG tablet Take 10 mg by mouth 2 (two) times daily.        . metoprolol succinate (TOPROL-XL) 25 MG 24 hr tablet Take 25 mg by mouth daily.        . ranitidine (ZANTAC) 150 MG tablet Take 150 mg by mouth 2 (two) times daily.        Echocardiogram: 5.13.2014 Moderate LVH; EF of 35-40%; moderate hypokinesis of the mid and distal anteroseptal segment; moderate TR  Physical Exam: Blood pressure 110/46, pulse 61, temperature 98.3 F (36.8 C), temperature source Axillary, resp. rate 14, height 5\' 6"  (1.676 m), weight 139 lb 5.3 oz (63.2 kg), SpO2 100.00%.    General: Well developed, well nourished, thin, in no acute distress Head: Eyes PERRLA, No xanthomas.   Normal cephalic and atramatic  Lungs: Clear bilaterally, poor inspiratory effort, wearing O2 facemask. Heart: HRRR S1 S2, distant heart sounds without MRG.  Pulses are 2+ & equal.            No carotid bruit. No JVD.  No abdominal bruits. No femoral bruits. Abdomen: Bowel sounds are positive, abdomen soft and non-tender without masses. Msk:  Back normal, diminished strength and tone for age. Extremities: No clubbing, cyanosis or edema.  DP +1 Neuro: Alert but no verbal response.Marland Kitchen Psych:  Unable to assess   Lab Results  Component Value Date   WBC 8.4 02/02/2013   HGB 9.6* 02/02/2013   HCT 32.0* 02/02/2013   MCV 94.1 02/02/2013   PLT 239 02/02/2013    Recent Labs Lab 02/01/13 0841 02/02/13 0600  NA 164* 161*  K 3.7 3.6  CL 121* 124*  CO2 39* 35*  BUN 53* 46*  CREATININE 1.06 0.90  CALCIUM 10.2 9.3  PROT 6.7  --   BILITOT 0.3  --   ALKPHOS 73  --   ALT 13  --   AST 22  --   GLUCOSE 126* 118*   Lab Results  Component Value Date   TROPONINI <0.30 02/01/2013    Recent Labs  01/18/13 0621 02/01/13 0841  PROBNP 4525.0* 2669.0*    Radiology: Ct Head  02/01/2013   No evidence of acute intracranial abnormality.  Multiple old cortical and lacunar infarcts.  Atrophy with extensive small vessel ischemic changes and intracranial atherosclerosis.      Chest  02/01/2013     Mild residual bilateral upper lobe pneumonia, left greater than right, improved.     EKG: Not completed this admission  ASSESSMENT AND PLAN:   1. Chronic systolic CHF:  There is no evidence of fluid overload on this admission.  Elevated pro BNP is noted, however improved since last admission.  With overall health status, would recommend consideration of hospice.  No further cardiac testing is planned or warranted.   2. UTI:   3. DO NOT RESUSCITATE  [name]. Lyman Bishop NP [facility] 02/02/2013,  10:28 AM  Cardiology Attending Patient interviewed and examined. Discussed with Joni Reining, NP.  Above note annotated and modified based upon my findings.  Patient has severe hypernatremia, the etiology of which is uncertain.  There is no history of excessive free water loss, but this appears to have occurred and will need rapid replacement to prevent permanent neurologic damage. D5W ordered. Diabetes insipidus is a diagnostic consideration, but there is no history  of same, and this occurred in the setting of new diuretic therapy. Furosemide has been discontinued.  Pneumonia appears to be resolving, and diagnosis of urinary tract infection is not established. Antibiotics will be held pending culture results.  [place] Bing, MD 02/02/2013, 5:36 PM

## 2013-02-03 ENCOUNTER — Inpatient Hospital Stay (HOSPITAL_COMMUNITY): Payer: Medicare PPO

## 2013-02-03 DIAGNOSIS — F0390 Unspecified dementia without behavioral disturbance: Secondary | ICD-10-CM

## 2013-02-03 LAB — BASIC METABOLIC PANEL
CO2: 34 mEq/L — ABNORMAL HIGH (ref 19–32)
CO2: 32 mEq/L (ref 19–32)
Calcium: 8.8 mg/dL (ref 8.4–10.5)
GFR calc Af Amer: 71 mL/min — ABNORMAL LOW (ref >90)
GFR calc non Af Amer: 60 mL/min — ABNORMAL LOW (ref >90)
GFR calc non Af Amer: 62 mL/min — ABNORMAL LOW (ref >90)
Glucose, Bld: 148 mg/dL — ABNORMAL HIGH (ref 70–99)
Potassium: 2.8 mEq/L — ABNORMAL LOW (ref 3.5–5.1)
Sodium: 156 mEq/L — ABNORMAL HIGH (ref 135–145)
Sodium: 153 mEq/L — ABNORMAL HIGH (ref 135–145)

## 2013-02-03 LAB — URINE CULTURE

## 2013-02-03 LAB — OSMOLALITY, URINE: Osmolality, Ur: 598 mOsm/kg (ref 390–1090)

## 2013-02-03 MED ORDER — POTASSIUM CHLORIDE 10 MEQ/100ML IV SOLN
10.0000 meq | INTRAVENOUS | Status: AC
  Administered 2013-02-03 (×3): 10 meq via INTRAVENOUS
  Filled 2013-02-03: qty 200
  Filled 2013-02-03: qty 100

## 2013-02-03 MED ORDER — SPIRONOLACTONE 25 MG PO TABS
12.5000 mg | ORAL_TABLET | Freq: Every day | ORAL | Status: DC
  Administered 2013-02-04 – 2013-02-05 (×2): 12.5 mg via ORAL
  Filled 2013-02-03 (×2): qty 1

## 2013-02-03 MED ORDER — DEXTROSE 5 % IV SOLN
INTRAVENOUS | Status: DC
  Administered 2013-02-04: 05:00:00 via INTRAVENOUS

## 2013-02-03 MED ORDER — POTASSIUM CHLORIDE 10 MEQ/100ML IV SOLN
10.0000 meq | INTRAVENOUS | Status: AC
  Administered 2013-02-03 (×6): 10 meq via INTRAVENOUS
  Filled 2013-02-03: qty 200
  Filled 2013-02-03: qty 100

## 2013-02-03 NOTE — Progress Notes (Signed)
ANTIBIOTIC CONSULT NOTE   Pharmacy Consult for Vancomycin & Cefepime d/c'd by Cardiology (see note by Dr Dietrich Pates)  Indication: pneumonia  No Known Allergies  Patient Measurements: Height: 5\' 6"  (167.6 cm) Weight: 138 lb 3.7 oz (62.7 kg) IBW/kg (Calculated) : 59.3  Vital Signs: Temp: 98.3 F (36.8 C) (05/29 0539) BP: 117/43 mmHg (05/29 0539) Pulse Rate: 51 (05/29 0539) Intake/Output from previous day: 05/28 0701 - 05/29 0700 In: 965 [I.V.:965] Out: 650 [Urine:650] Intake/Output from this shift: Total I/O In: 100 [IV Piggyback:100] Out: -   Labs:  Recent Labs  02/01/13 0841 02/02/13 0600 02/02/13 2207 02/03/13 0511  WBC 10.5 8.4  --   --   HGB 9.7* 9.6*  --   --   PLT 288 239  --   --   CREATININE 1.06 0.90 0.91 0.83   Estimated Creatinine Clearance: 41.3 ml/min (by C-G formula based on Cr of 0.83). No results found for this basename: VANCOTROUGH, Leodis Binet, VANCORANDOM, GENTTROUGH, GENTPEAK, GENTRANDOM, TOBRATROUGH, TOBRAPEAK, TOBRARND, AMIKACINPEAK, AMIKACINTROU, AMIKACIN,  in the last 72 hours   Microbiology: Recent Results (from the past 720 hour(s))  MRSA PCR SCREENING     Status: None   Collection Time    01/18/13  8:24 AM      Result Value Range Status   MRSA by PCR NEGATIVE  NEGATIVE Final   Comment:            The GeneXpert MRSA Assay (FDA     approved for NASAL specimens     only), is one component of a     comprehensive MRSA colonization     surveillance program. It is not     intended to diagnose MRSA     infection nor to guide or     monitor treatment for     MRSA infections.  URINE CULTURE     Status: None   Collection Time    02/01/13  1:35 PM      Result Value Range Status   Specimen Description URINE, CATHETERIZED   Final   Special Requests NONE   Final   Culture  Setup Time 02/01/2013 19:39   Final   Colony Count 7,000 COLONIES/ML   Final   Culture ESCHERICHIA COLI   Final   Report Status PENDING   Incomplete   Medical  History: Past Medical History  Diagnosis Date  . CVA (cerebral vascular accident)   . Hypertension   . Dementia   . Osteoporosis   . MI (myocardial infarction)   . COPD (chronic obstructive pulmonary disease)   . Systolic HF (heart failure)     5/14   Medications:  Scheduled:  . enoxaparin (LOVENOX) injection  40 mg Subcutaneous Q24H  . potassium chloride  10 mEq Intravenous Q1 Hr x 3  . sodium chloride  3 mL Intravenous Q12H   Assessment: 79 yo F admitted from NH with AMS and PNA per CXR.  She received Rocephin 1gm in ED but antibiotics are being broadened to Vancomycin & Cefepime.  Renal function appears to be at patient's baseline.  SEE NOTE BY DR Dietrich Pates TO HOLD ABX PENDING CULTURE RESULTS  Goal of Therapy:  Vancomycin trough level 15-20 mcg/ml  Plan:  Hold antibiotics per Dr Dietrich Pates F/U plan  Valrie Hart A 02/03/2013,9:17 AM

## 2013-02-03 NOTE — Progress Notes (Signed)
SUBJECTIVE: Opens eyes and tracks. No verbal response.  Principal Problem:   Acute-on-chronic respiratory failure Active Problems:   CHF (congestive heart failure)   Essential hypertension   Dementia   COPD (chronic obstructive pulmonary disease)   Encephalopathy   HCAP (healthcare-associated pneumonia)   Hypernatremia   Anemia   Dehydration   Apnea   Hypothermia   Sepsis  LABS: Basic Metabolic Panel:  Recent Labs  16/10/96 2207 02/03/13 0511  NA 156* 156*  K 3.3* 2.8*  CL 116* 116*  CO2 35* 34*  GLUCOSE 132* 148*  BUN 43* 42*  CREATININE 0.91 0.83  CALCIUM 9.1 8.8   Liver Function Tests:  Recent Labs  02/01/13 0841  AST 22  ALT 13  ALKPHOS 73  BILITOT 0.3  PROT 6.7  ALBUMIN 2.9*   CBC:  Recent Labs  02/01/13 0841 02/02/13 0600  WBC 10.5 8.4  NEUTROABS 7.9*  --   HGB 9.7* 9.6*  HCT 31.3* 32.0*  MCV 92.6 94.1  PLT 288 239   Cardiac Enzymes:  Recent Labs  02/01/13 0841  TROPONINI <0.30   Chest  02/01/2013   Mild residual bilateral upper lobe opacities, left greater than right, improved.   PHYSICAL EXAM BP 117/43  Pulse 51  Temp(Src) 98.3 F (36.8 C) (Oral)  Resp 16  Ht 5\' 6"  (1.676 m)  Wt 138 lb 3.7 oz (62.7 kg)  BMI 22.32 kg/m2  SpO2 100% General: Well developed, thin, in no acute distress Head: Eyes PERRLA, No xanthomas.   Normal cephalic and atramatic  Lungs: Clear bilaterally to auscultation and percussion. Heart: HRRR S1 S2, No MRG;  Pulses are 2+ & equal.  No carotid bruit. No JVD.  No abdominal bruits. No femoral bruits. Abdomen: Bowel sounds are hypoactive to absent, abdomen soft and non-tender without masses  Msk:  Back normal, normal gait. Normal strength and tone for age. Extremities: No clubbing, cyanosis or edema.  DP +1 Neuro: Alert. Unable to assess orientation Psych: Unable to assess   ASSESSMENT AND PLAN:  1. Chronic Systolic CHF: EF of 35%-40% No evidence of fluid overload, suggestive of CHF exacerbation.  Elevated Pro-BNP but much improved from previous admission. No PO intake at present. Consider   2. Hypernatremia: NS has been stopped and changed to D5W at 100 cc hour.  3. Pneumonia: Continued antibiotic treatment per Dr. Renard Matter.  4. Hypokalemia:K+ 2.8 this am. Now being repleted with IV infusion.  She will need substantial intravenous replacement since urinary losses are sizable. Aldactone added to medication to reduce potassium excretion. This will result in some sodium retention, but hypernatremia is correcting at an appropriate rate.  5. Hypoactive to absent bowel sounds: Check flat plate of abdomen  6. DNR  Bettey Mare. Lyman Bishop NP [facility] 02/03/2013, 9:32 AM  Cardiology Attending Patient examined. Discussed with Joni Reining, NP.  Above note annotated and modified based upon my findings.  Hypernatremia is improving at an appropriate rate. She is wasting potassium and has a very low serum potassium level. We likely would have difficulty administering appropriate doses of oral potassium. A total of 90 mEq of potassium will be provided IV with continued monitoring of electrolyte levels and renal function.  Urine osmolarity is not low suggesting that diabetes insipidus is not present.  [place] Bing, MD 02/03/2013, 4:28 PM

## 2013-02-03 NOTE — Progress Notes (Signed)
[name], WAFER              ACCOUNT NO.:  [id number]  MEDICAL RECORD NO.:  [id number]  LOCATION:  A217                          FACILITY:  [facility]  PHYSICIAN:  Skippy Marhefka G. Renard Matter, MD   DATE OF BIRTH:  April 28, 1919  DATE OF PROCEDURE: DATE OF DISCHARGE:                                PROGRESS NOTE   This patient is extremely lethargic this morning but vital signs remained stable.  She does have COPD, dementia, systolic heart failure, and mild pulmonary edema, more recently hypernatremia and hypokalemia, previous coronary artery disease and hypertension, previous CVA.  OBJECTIVE:  VITAL SIGNS:  Blood pressure 117/43, respirations 16, pulse 51, temp 98.3. HEENT:  Eyes, PERRLA.  TM negative.  Oropharynx benign. NECK:  Supple.  No JVD or thyroid abnormalities. HEART:  Regular rhythm.  No murmurs. LUNGS:  Diminished breath sounds bilaterally. ABDOMEN:  No palpable organs or masses. SKIN:  Warm and dry.  ASSESSMENT:  Healthcare associated pneumonia, dehydration, acute on chronic respiratory failure, systolic heart failure with 35-40% ejection fraction and elevated proBNP, essential hypertension, chronic obstructive pulmonary disease.  PLAN:  To continue current supportive treatment.  Continue antibiotic regimen.  We will replete potassium which is low.  She does have mild residual bilateral upper lobe pneumonia greater on the right, remains on Rocephin.  No further cardiac testing is being considered.     Isha Seefeld G. Renard Matter, MD     AGM/MEDQ  D:  02/03/2013  T:  02/03/2013  Job:  [id number]

## 2013-02-03 NOTE — Clinical Social Work Note (Signed)
CSW faxed information to [facility] for their determination of ability to assist w hospice services if patient returns to [facility].  [facility] staff evaluated patient yesterday and determined that they could only take patient back if hospice services were in place.  MD notified of [facility] decision, MD and family agreeable to hospice evaluating patient and deciding if they can offer services at ALF at this time.  CSW spoke w son, son wants to consider both SNF placement and return to ALF w hospice in place.  CSW discussed issue of SNF vs return to ALF w hospice w son. Son requested that PT evaluate patient when she is able to tolerate assessment in order to give their determination of level of care likely needed at discharge.  Hospice home health will meet w son at bedside approx 12:30 today to explain their services and discuss what could be offered at [facility].  CSW will continue to monitor patient condition, can ask for PT evaluation when patient is medically stable.   Santa Genera, LCSW Clinical Social Worker [phone])

## 2013-02-04 ENCOUNTER — Inpatient Hospital Stay (HOSPITAL_COMMUNITY): Payer: Medicare PPO

## 2013-02-04 DIAGNOSIS — I509 Heart failure, unspecified: Secondary | ICD-10-CM

## 2013-02-04 DIAGNOSIS — I5022 Chronic systolic (congestive) heart failure: Secondary | ICD-10-CM

## 2013-02-04 LAB — BASIC METABOLIC PANEL
Calcium: 8.5 mg/dL (ref 8.4–10.5)
Chloride: 107 mEq/L (ref 96–112)
GFR calc Af Amer: 86 mL/min — ABNORMAL LOW (ref >90)
GFR calc Af Amer: 85 mL/min — ABNORMAL LOW (ref >90)
GFR calc non Af Amer: 74 mL/min — ABNORMAL LOW (ref >90)
GFR calc non Af Amer: 73 mL/min — ABNORMAL LOW (ref >90)
Glucose, Bld: 143 mg/dL — ABNORMAL HIGH (ref 70–99)
Potassium: 3.5 mEq/L (ref 3.5–5.1)
Potassium: 3.2 mEq/L — ABNORMAL LOW (ref 3.5–5.1)
Sodium: 151 mEq/L — ABNORMAL HIGH (ref 135–145)
Sodium: 145 mEq/L (ref 135–145)

## 2013-02-04 MED ORDER — POTASSIUM CHLORIDE 2 MEQ/ML IV SOLN
INTRAVENOUS | Status: DC
  Administered 2013-02-04 – 2013-02-05 (×2): via INTRAVENOUS
  Filled 2013-02-04 (×9): qty 1000

## 2013-02-04 NOTE — Evaluation (Signed)
Clinical/Bedside Swallow Evaluation Patient Details  Name: [name] MRN: [id number] Date of Birth: 06/27/1919  Today's Date: 02/04/2013 Time: 1420- 1500    Past Medical History:  Past Medical History  Diagnosis Date  . CVA (cerebral vascular accident)   . Hypertension   . Dementia   . Osteoporosis   . MI (myocardial infarction)   . COPD (chronic obstructive pulmonary disease)   . Systolic HF (heart failure)     5/14   Past Surgical History: History reviewed. No pertinent past surgical history. HPI:  health care acquired pneumonia  AMS dehydration  resp failure  heart failure  dementia  HTN  COPD    Resident of [facility] on ppuree and NTL diet with poor intake prior to hospital admission   Assessment / Plan / Recommendation Clinical Impression  patient exhibiting oropharyngeal dysphagia with decreased overall swallow function  Pt tolerated puree consistencies with good oral opening, cleared spoon, no residue, timely swallow and no s/s after swallow.  With NTL (which pt on prior) exhibited holdingin oral cavity, swallow delay, wet voice quality and spontaneous throat clear x2  With HTL patient with no resdieu, timely swallow and no s/s after swallow   patient did exhibit quick fatigue with intake and question if can take adequate amounts po for nutrition and hydration    MD questioning Hospice consult    Aspiration Risk  Moderate    Diet Recommendation Dysphagia 1 (Puree);Honey-thick liquid   Liquid Administration via: Cup Medication Administration: Crushed with puree Supervision: Staff feed patient Compensations: Slow rate;Small sips/bites;Check for pocketing Postural Changes and/or Swallow Maneuvers: Seated upright 90 degrees    Other  Recommendations Oral Care Recommendations: Oral care before and after PO Other Recommendations: Order thickener from pharmacy;Prohibited food (jello, ice cream, thin soups);Remove water pitcher   Follow Up Recommendations  Patient  is to return to SNF  With questionable Hospice referral   Defer follow up to SLP at SNF for diet changes   Frequency and Duration  (defer to SLP at SNF for possible liquid upgrade)             Swallow Study Prior Functional Status   Patient living at [facility] on puree diet and NTL  Poor intake prior to admission to hospital       General Date of Onset: 02/01/13 HPI: health care acquired pneumonia  AMS dehydration  resp failure  heart failure  dementia  HTN  COPD    Resident of [facility] on ppuree and NTL diet with poor intake prior to hospital admission Type of Study: Bedside swallow evaluation Previous Swallow Assessment: none noted Diet Prior to this Study: Dysphagia 1 (puree);Nectar-thick liquids Temperature Spikes Noted: No Respiratory Status: Supplemental O2 delivered via (comment) Behavior/Cognition: Requires cueing Oral Cavity - Dentition: Poor condition;Missing dentition Self-Feeding Abilities: Total assist Patient Positioning: Upright in bed Baseline Vocal Quality: Clear Volitional Cough: Weak Volitional Swallow: Able to elicit    Oral/Motor/Sensory Function Overall Oral Motor/Sensory Function: Impaired at baseline Labial Symmetry: Within Functional Limits Labial Strength: Reduced Labial Sensation: Reduced Lingual ROM: Reduced right;Reduced left Lingual Symmetry: Within Functional Limits Lingual Strength: Reduced Lingual Sensation: Reduced Facial ROM: Reduced right;Reduced left Facial Symmetry: Within Functional Limits Facial Strength: Reduced Facial Sensation: Reduced Velum: Within Functional Limits Mandible: Within Functional Limits          Nectar Thick Nectar Thick Liquid: Impaired Presentation: Spoon Oral Phase Impairments: Reduced lingual movement/coordination Pharyngeal Phase Impairments: Suspected delayed Swallow;Wet Vocal Quality;Throat Clearing - Delayed  Other Comments:  (delayed swallow and wet voice quality with 'ah')   Honey Thick  Honey Thick Liquid: Within functional limits Presentation: Cup Other Comments:  (no s/s with HTL)   Puree Puree: Within functional limits Presentation: Spoon Other Comments:  (no s/s with puree and was on puree prior)              Melanee Left L 02/04/2013,3:20 PM

## 2013-02-04 NOTE — Clinical Social Work Note (Signed)
CSW spoke w son, Ishani Goldwasser, to update him on discharge planning for patient - patient expected to return to [facility] w hospice care.  Hospice staff evaluated patient yesterday and found her appropriate for their services.  RN CM arranged needed medical equipment and coordinating w home hospice services.  Son agreeable to plan.  Marchelle Folks from [facility] also agreeable to patient return to ALF w hospice support.    Santa Genera, LCSW Clinical Social Worker [phone])

## 2013-02-04 NOTE — Progress Notes (Signed)
NAMELUDENE, STOKKE              ACCOUNT NO.:  [id number]  MEDICAL RECORD NO.:  [id number]  LOCATION:  A217                          FACILITY:  [facility]  PHYSICIAN:  Jade Burright G. Renard Matter, MD   DATE OF BIRTH:  02/01/1919  DATE OF PROCEDURE: DATE OF DISCHARGE:                                PROGRESS NOTE   SUBJECTIVE:  This patient appeared more alert this morning.  Her vital signs remained stable.  She does have COPD, dementia, systolic heart failure, mild pulmonary edema, and possible early pneumonia.  She has been treated for hypernatremia and hypokalemia.  She received several runs of KCl last evening and that will be repeated today.  She does have previous coronary artery disease and hypertension.  OBJECTIVE:  VITAL SIGNS:  Blood pressure 123/39, respirations 16, pulse 57, temp 97.9.  Her chemistries show a potassium of 3.5 and sodium 151. HEENT:  Eyes PERRLA.  TM negative.  Oropharynx benign. NECK:  Supple.  No JVD or thyroid abnormalities. HEART:  Regular rhythm.  No murmurs. LUNGS:  Diminished breath sounds bilaterally. ABDOMEN:  No palpable organs or masses. SKIN:  Warm and dry.  ASSESSMENT:  The patient has healthcare-associated pneumonia and has been treated for dehydration.  She has chronic respiratory failure, systolic heart failure with 35-40% ejection fraction and elevated proBNP, essential hypertension, chronic obstructive pulmonary disease, hypokalemia, hypernatremia.  PLAN:  To continue current supportive treatment.  Continue antibiotic regimen.  We will continue to monitor serum electrolytes.  We will repeat chest x-ray.  We did have discussion with social services as well as the family yesterday.  If at all possible, we would like to consider getting her back to [facility] with hospice involved when appropriate to discharge.     Aws Shere G. Renard Matter, MD     AGM/MEDQ  D:  02/04/2013  T:  02/04/2013  Job:  [id number]

## 2013-02-04 NOTE — Progress Notes (Signed)
   Consulting cardiologist: Dr. [place] Bing  SUBJECTIVE: Awake but unresponsive.   LABS: Basic Metabolic Panel:  Recent Labs  46/96/29 1635 02/04/13 0440  NA 153* 151*  K 3.5 3.5  CL 114* 113*  CO2 32 30  GLUCOSE 106* 143*  BUN 37* 31*  CREATININE 0.81 0.68  CALCIUM 8.8 8.5   CBC:  Recent Labs  02/02/13 0600  WBC 8.4  HGB 9.6*  HCT 32.0*  MCV 94.1  PLT 239    RADIOLOGY: Dg Abd 1 View  02/03/2013   *RADIOLOGY REPORT*  Clinical Data: Absent bowel sounds  ABDOMEN - 1 VIEW  Comparison: None.  Findings: Paucity of bowel gas without dilated loops of small bowel to suggest small bowel obstruction.  Degenerative changes of the visualized thoracolumbar spine.  IMPRESSION: Unremarkable abdominal radiograph.   Original Report Authenticated By: Charline Bills, M.D.   Dg Chest Port 1 View  02/04/2013   *RADIOLOGY REPORT*  Clinical Data: Follow up pneumonia  PORTABLE CHEST - 1 VIEW  Comparison: Portable chest x-ray of 02/01/2013  Findings: The lungs are not quite as well aerated with mild haziness at the lung bases most consistent with atelectasis and possible small effusions.  Cardiomegaly is stable.  IMPRESSION: Slightly diminished aeration.  Haziness at the lung bases most likely reflects mild atelectasis.   Original Report Authenticated By: Dwyane Dee, M.D.    PHYSICAL EXAM BP 123/39  Pulse 57  Temp(Src) 97.9 F (36.6 C) (Oral)  Resp 16  Ht 5\' 6"  (1.676 m)  Wt 141 lb 12.1 oz (64.3 kg)  BMI 22.89 kg/m2  SpO2 100% General: Frail, unresponsive. Lungs: Clear bilaterally. Heart: RRR S1 S2, No gallop. Extremities: No clubbing, cyanosis or edema.  DP +1   TELEMETRY: Reviewed telemetry pt in SR  ASSESSMENT AND PLAN:  1. Chronic Systolic CHF: EF of 35%-40%  No evidence of fluid overload, suggestive of CHF exacerbation. Elevated Pro-BNP but much improved from previous admission.  Wt up 2 lbs only since admission. No longer on diuretic, but has been placed on  spironolactone to assist with potassium sparing. No planned cardiac testing. Will be available as needed.  2. Hypernatremia: Continues on D5W infusion. Sodium down to 151 this am. Diuretics held.  3. Pneumonia: Continued antibiotic treatment per Dr. Renard Matter.   4. Hypokalemia: Received multiple IV infusion replacement. Now improved to 3.5 this am. Not taking in PO nourishment. Can consider PEG or alternative feedings at the discretion of PCP  5. DNR/FTT: Plans to return to SNF.   Bettey Mare. Lyman Bishop NP [facility] 02/04/2013, 8:42 AM   Attending note:  Patient seen and examined. Reviewed recent consultation by Dr. Dietrich Pates. Patient's overall status remains poor, not particularly responsive. Sodium level is reducing on D5W, and her Lasix has been held. Potassium level also improving. Conservative cardiac management anticipated going forward, particularly in light of poor prognosis and DO NOT RESUSCITATE status. Might consider Hospice evaluation.  Jonelle Sidle, M.D., F.A.C.C.

## 2013-02-05 LAB — BASIC METABOLIC PANEL
BUN: 12 mg/dL (ref 6–23)
CO2: 29 mEq/L (ref 19–32)
Chloride: 105 mEq/L (ref 96–112)
GFR calc Af Amer: 86 mL/min — ABNORMAL LOW (ref >90)
GFR calc Af Amer: 88 mL/min — ABNORMAL LOW (ref >90)
GFR calc non Af Amer: 76 mL/min — ABNORMAL LOW (ref >90)
Potassium: 4.3 mEq/L (ref 3.5–5.1)
Sodium: 142 mEq/L (ref 135–145)

## 2013-02-05 MED ORDER — POTASSIUM CHLORIDE 10 MEQ/100ML IV SOLN
10.0000 meq | INTRAVENOUS | Status: AC
  Administered 2013-02-05 (×4): 10 meq via INTRAVENOUS
  Filled 2013-02-05 (×3): qty 100

## 2013-02-05 MED ORDER — SPIRONOLACTONE 12.5 MG HALF TABLET: 12.5000 mg | ORAL_TABLET | Freq: Every day | ORAL | Status: DC

## 2013-02-05 MED ORDER — POTASSIUM CHLORIDE 10 MEQ/100ML IV SOLN
INTRAVENOUS | Status: AC
  Filled 2013-02-05: qty 100

## 2013-02-05 NOTE — Discharge Summary (Signed)
[name], [name]              ACCOUNT NO.:  [id number]  MEDICAL RECORD NO.:  [id number]  LOCATION:  A217                          FACILITY:  [facility]  PHYSICIAN:  Keona Bilyeu G. Renard Matter, MD   DATE OF BIRTH:  1918-09-12  DATE OF ADMISSION:  02/01/2013 DATE OF DISCHARGE:  05/31/2014LH                              DISCHARGE SUMMARY   ADDENDUM/CONTINUATION  The patient will also be on continuous nasal oxygen at 4 L/min.  Her O2 sat have been in the 90s, on this dosage.     [name] G. Renard Matter, MD     AGM/MEDQ  D:  02/05/2013  T:  02/05/2013  Job:  [id number]

## 2013-02-05 NOTE — Progress Notes (Signed)
[name], [name]              ACCOUNT NO.:  [id number]  MEDICAL RECORD NO.:  [id number]  LOCATION:  A217                          FACILITY:  [facility]  PHYSICIAN:  Renae Mottley G. Renard Matter, MD   DATE OF BIRTH:  10-Oct-1918  DATE OF PROCEDURE: DATE OF DISCHARGE:                                PROGRESS NOTE   This patient has had a history of COPD, dementia, systolic heart failure, mild pulmonary edema, early pneumonia.  She has been treated for hypernatremia and hypokalemia.  She received several runs of KCl yesterday and her labs had been repeated.  Her morning labs shows her sodium 142, potassium 3.1, chloride 105, CO2 of 29, BUN 18, creatinine 0.69, calcium 8.1.  VITAL SIGNS:  Blood pressure 106/49, respirations 20, pulse 66, temp 98.4. HEENT:  Eyes, PERRLA.  TMs negative.  Oropharynx benign. NECK:  Supple.  No JVD or thyroid abnormalities. HEART:  Regular rhythm.  No murmurs. LUNGS:  Diminished breath sounds bilaterally. ABDOMEN:  No palpable organs or masses. SKIN:  Warm and dry.  ASSESSMENT:  The patient has healthcare associated pneumonia, has been treated for dehydration, has chronic respiratory failure, systolic heart failure with ejection fraction 35-40% and elevated proBNP, essential hypertension, chronic obstructive pulmonary disease, hypokalemia, hypernatremia.  PLAN:  The patient is supposed to go back to [facility] with hospice.  She does have extremely low serum potassium level again today. We will have to have IV runs of potassium and later today or in the morning, we will need to get her to [facility] if oxygen levels remain in satisfactory range on nasal cannula.     [name] G. Renard Matter, MD     AGM/MEDQ  D:  02/05/2013  T:  02/05/2013  Job:  [id number]

## 2013-02-05 NOTE — Progress Notes (Signed)
Pt. On 4L [place] at rest saturation in high 90s. Pt. Stable at this level of O2. Being d/c to [facility] today.

## 2013-02-05 NOTE — Discharge Summary (Signed)
[name], [name]              ACCOUNT NO.:  [id number]  MEDICAL RECORD NO.:  [id number]  LOCATION:  A217                          FACILITY:  [facility]  PHYSICIAN:  Osbaldo Mark G. Renard Matter, MD   DATE OF BIRTH:  1919-01-21  DATE OF ADMISSION:  02/01/2013 DATE OF DISCHARGE:  05/31/2014LH                              DISCHARGE SUMMARY   4 days hospitalization.  DIAGNOSES:  Chronic systolic congestive heart failure with ejection fraction 35-40%, healthcare associated pneumonia, chronic obstructive pulmonary disease, dementia, previous cerebrovascular accident, coronary artery disease, essential hypertension, hypernatremia, hypokalemia.  The patient's condition is stable at the time of her discharge back to nursing facility.  She is DNR.  Has discussed with family member.  This patient normally resides at [name].  Presented to the emergency department with altered mental status due to dementia.  She had become unresponsive.  She had previous history of pulmonary edema and N-STEMI and had been evaluated by Cardiology.  Chest x-ray on admission showed bilateral upper lobe pneumonia, left greater than right.  CT of the head showed no evidence of acute intracranial abnormalities.  Old cortical lacunar infarcts were noted.  Oxygen saturation on admission was 81%, but improved to 90% on oxygen.  She was admitted for further evaluation.  PHYSICAL EXAMINATION:  VITAL SIGNS:  On admission, Blood pressure 119/48, pulse 66, respirations 24. HEENT:  PERRLA.  TM negative.  Oropharynx benign. NECK:  Supple.  No JVD or thyroid abnormalities. HEART:  Regular rhythm.  No murmurs. LUNGS:  Decreased breath sounds bilaterally. ABDOMEN:  No palpable organs or masses. SKIN:  Warm and dry. NEUROLOGIC:  The patient has slight facial droop on right. EXTREMITIES:  Normal.  No abnormal reflexes.  LABORATORY DATA:  On admission, sodium 164, potassium 3.7, chloride 121, CO2 of 39, glucose 126, BUN 53,  creatinine 1.06, calcium 10.2, AST 22, ALT 13, alkaline phosphatase 73, bilirubin 0.3, protein 6.7, albumin 2.9.  CBC on admission, WBC 10,500 with hemoglobin 9.7, hematocrit 31.3. A proBNP 2669.  Subsequent chemistries, Feb 04, 2013, sodium 151, potassium 3.5, chloride 113, CO2 of 30, BUN 31, creatinine 0.68.  On Feb 04, 2013, potassium was 3.2.  On Feb 05, 2013, potassium 3.1.  X-RAYS:  Chest x-ray on admission showed diminished aeration.  The patient has lung bases, most likely reflects mild atelectasis. Abdominal films are unremarkable.  Abdominal radiographs, CT of the head, old right cerebellar infarct, old lacunar infarcts.  HOSPITAL COURSE:  On admission, patient was placed on intravenous fluids.  Throughout the entire hospitalization, the patient was lethargic and would not respond much to spoken voice.  She was continued on IV fluids.  She was given Lovenox subcu 40 mg daily.  She had to be supplemented with runs of IV potassium and extremely low serum potassium.  She was given over periods of time 90 mEq as ordered by Cardiology.  Discussions were held with family about her placement. [name] agreed to take her back their if she could be own hospice, she is DNR and seems to be getting in to her more terminal situation.  She was seen in consultation by Cardiology both by Dr. Dietrich Pates and Dr. Diona Browner.  It  was noted she did have hypernatremia. Her IV fluids, D5 was increased and she understood, was wasting potassium with a very low serum potassium level.  This was repleted. Cardiology felt that the patient had extremely poor prognosis and felt that hospice should be considered as well.  The patient remained on nasal O2 and __________ could be used possibly with potassium sparing effect rather than Lasix.  She has been given runs of potassium again today.  Hopefully, she can be discharged back to nursing facility this afternoon for terminal care.     Hilton Saephan G.  Renard Matter, MD     AGM/MEDQ  D:  02/05/2013  T:  02/05/2013  Job:  [id number]

## 2013-02-06 NOTE — Discharge Summary (Signed)
NAMECARMILLA, [name]              ACCOUNT NO.:  [id number]  MEDICAL RECORD NO.:  [id number]  LOCATION:  A217                          FACILITY:  [facility]  PHYSICIAN:  Destry Dauber G. Renard Matter, MD   DATE OF BIRTH:  1919/06/17  DATE OF ADMISSION:  02/01/2013 DATE OF DISCHARGE:  05/31/2014LH                              DISCHARGE SUMMARY   ADDENDUM:  This patient received 4 runs of 10 mEq of potassium intravenously today because of a low serum potassium.  __________ is being ordered.  The patient will be sent back to [facility] on hospice care.  She will be on the following medications; spironolactone 12.5 mg daily, Plavix 75 mg daily, Aricept 10 mg daily, Namenda 10 mg daily, ferrous sulfate 300 mg daily syrup, Avapro 75 mg daily, metoprolol succinate 25 mg daily, and Zantac 150 mg daily.  The patient will be released to [facility] Unit today.     Jeremy Ditullio G. Renard Matter, MD     AGM/MEDQ  D:  02/05/2013  T:  02/05/2013  Job:  [id number]

## 2013-02-07 ENCOUNTER — Emergency Department (HOSPITAL_COMMUNITY): Payer: Medicare PPO

## 2013-02-07 ENCOUNTER — Emergency Department (HOSPITAL_COMMUNITY)
Admission: EM | Admit: 2013-02-07 | Discharge: 2013-02-07 | Disposition: A | Discharge status: Skilled Nursing Facility | Payer: Medicare PPO | Attending: Emergency Medicine | Admitting: Emergency Medicine

## 2013-02-07 DIAGNOSIS — Z79899 Other long term (current) drug therapy: Secondary | ICD-10-CM | POA: Insufficient documentation

## 2013-02-07 DIAGNOSIS — I5020 Unspecified systolic (congestive) heart failure: Secondary | ICD-10-CM | POA: Insufficient documentation

## 2013-02-07 DIAGNOSIS — S99929A Unspecified injury of unspecified foot, initial encounter: Secondary | ICD-10-CM | POA: Insufficient documentation

## 2013-02-07 DIAGNOSIS — Z8673 Personal history of transient ischemic attack (TIA), and cerebral infarction without residual deficits: Secondary | ICD-10-CM | POA: Insufficient documentation

## 2013-02-07 DIAGNOSIS — W06XXXA Fall from bed, initial encounter: Secondary | ICD-10-CM | POA: Insufficient documentation

## 2013-02-07 DIAGNOSIS — W19XXXA Unspecified fall, initial encounter: Secondary | ICD-10-CM

## 2013-02-07 DIAGNOSIS — F0390 Unspecified dementia without behavioral disturbance: Secondary | ICD-10-CM | POA: Insufficient documentation

## 2013-02-07 DIAGNOSIS — I252 Old myocardial infarction: Secondary | ICD-10-CM | POA: Insufficient documentation

## 2013-02-07 DIAGNOSIS — M810 Age-related osteoporosis without current pathological fracture: Secondary | ICD-10-CM | POA: Insufficient documentation

## 2013-02-07 DIAGNOSIS — J449 Chronic obstructive pulmonary disease, unspecified: Secondary | ICD-10-CM | POA: Insufficient documentation

## 2013-02-07 DIAGNOSIS — J4489 Other specified chronic obstructive pulmonary disease: Secondary | ICD-10-CM | POA: Insufficient documentation

## 2013-02-07 DIAGNOSIS — Y939 Activity, unspecified: Secondary | ICD-10-CM | POA: Insufficient documentation

## 2013-02-07 DIAGNOSIS — S8990XA Unspecified injury of unspecified lower leg, initial encounter: Secondary | ICD-10-CM | POA: Insufficient documentation

## 2013-02-07 DIAGNOSIS — Y9210 Unspecified residential institution as the place of occurrence of the external cause: Secondary | ICD-10-CM | POA: Insufficient documentation

## 2013-02-07 DIAGNOSIS — Z7902 Long term (current) use of antithrombotics/antiplatelets: Secondary | ICD-10-CM | POA: Insufficient documentation

## 2013-02-07 DIAGNOSIS — S8000XA Contusion of unspecified knee, initial encounter: Secondary | ICD-10-CM | POA: Insufficient documentation

## 2013-02-07 DIAGNOSIS — I10 Essential (primary) hypertension: Secondary | ICD-10-CM | POA: Insufficient documentation

## 2013-02-07 NOTE — ED Provider Notes (Signed)
History     CSN: [id number]  Arrival date & time 02/07/13  0305   First MD Initiated Contact with Patient 02/07/13 [phone]      Chief Complaint  Patient presents with  . Fall    (Consider location/radiation/quality/duration/timing/severity/associated sxs/prior treatment) HPI Level 5 caveat due to dementia HPI Comments: [name] is a 79 y.o. female brought in by ambulance, from SNF  who presents to the Emergency Department complaining of having fallen out of bed. She is basically non verbal but says her left foot hurts.   PCP Dr. Renard Matter  Past Medical History  Diagnosis Date  . CVA (cerebral vascular accident)   . Hypertension   . Dementia   . Osteoporosis   . MI (myocardial infarction)   . COPD (chronic obstructive pulmonary disease)   . Systolic HF (heart failure)     5/14    No past surgical history on file.  Family History  Problem Relation Age of Onset  . Heart failure Sister   . Heart attack Sister     History  Substance Use Topics  . Smoking status: Never Smoker   . Smokeless tobacco: Never Used  . Alcohol Use: No    OB History   Grav Para Term Preterm Abortions TAB SAB Ect Mult Living                  Review of Systems  Unable to perform ROS: Dementia    Allergies  Review of patient's allergies indicates no known allergies.  Home Medications   Current Outpatient Rx  Name  Route  Sig  Dispense  Refill  . clopidogrel (PLAVIX) 75 MG tablet   Oral   Take 75 mg by mouth daily.           Marland Kitchen donepezil (ARICEPT) 10 MG tablet   Oral   Take 10 mg by mouth at bedtime.           . ferrous sulfate 300 (60 FE) MG/5ML syrup   Oral   Take 5 mLs (300 mg total) by mouth daily.   150 mL   3   . irbesartan (AVAPRO) 75 MG tablet   Oral   Take 1 tablet (75 mg total) by mouth daily.   30 tablet   5   . memantine (NAMENDA) 10 MG tablet   Oral   Take 10 mg by mouth 2 (two) times daily.           . metoprolol succinate (TOPROL-XL) 25 MG  24 hr tablet   Oral   Take 25 mg by mouth daily.           . ranitidine (ZANTAC) 150 MG tablet   Oral   Take 150 mg by mouth 2 (two) times daily.           Marland Kitchen spironolactone (ALDACTONE) 12.5 mg TABS   Oral   Take 0.5 tablets (12.5 mg total) by mouth daily.   30 tablet   5     BP 151/54  Pulse 72  Temp(Src) 97.9 F (36.6 C) (Oral)  Resp 18  SpO2 95%  Physical Exam  Nursing note and vitals reviewed. Constitutional:  Awake, alert, nontoxic appearance.Thin elderly lady with indwelling foley catheter and in diapers.  HENT:  Head: Normocephalic and atraumatic.  Right Ear: External ear normal.  Left Ear: External ear normal.  Dry mouth with exudate on teeth  Eyes: EOM are normal. Pupils are equal, round, and reactive to light.  Neck: Neck supple.  Cardiovascular: Normal rate and intact distal pulses.   Pulmonary/Chest: Effort normal and breath sounds normal. She exhibits no tenderness.  Fine rales at the bases  Abdominal: Soft. There is no tenderness. There is no rebound.  Musculoskeletal: She exhibits no tenderness.  Baseline ROM, no obvious new focal weakness.Able to passively move the left foot through full range of motion.   Neurological:  Mental status and motor strength appears baseline for patient and situation.  Skin: No rash noted.  New bruises to both knees. Healing bruises to lower arms and hands.  Psychiatric: She has a normal mood and affect.    ED Course  Procedures (including critical care time)  Dg Foot Complete Left  02/07/2013   *RADIOLOGY REPORT*  Clinical Data: Status post fall; left foot pain.  LEFT FOOT - COMPLETE 3+ VIEW  Comparison: Left ankle radiographs performed 08/14/2006  Findings: There is no evidence of fracture or dislocation.  There is mild chronic deformity involving the distal tibia.  The joint spaces are preserved.  There is no evidence of talar subluxation; the subtalar joint is unremarkable in appearance.  A small posterior calcaneal  spur is incidentally seen.  No significant soft tissue abnormalities are seen.  IMPRESSION: No evidence of fracture or dislocation.   Original Report Authenticated By: Tonia Ghent, M.D.     MDM  Demented patient from SNF brought in after falling out of bed with c/o left foot Pain. Film of the foot is normal. She will be returned to the SNF. Pt stable in ED with no significant deterioration in condition.The patient appears reasonably screened and/or stabilized for discharge and I doubt any other medical condition or other [facility] requiring further screening, evaluation, or treatment in the ED at this time prior to discharge.  MDM Number of Diagnoses or Management Options MDM Reviewed: nursing note and vitals Interpretation: x-ray           Nicoletta Dress. Colon Branch, MD 02/07/13 [phone]

## 2013-02-07 NOTE — ED Notes (Signed)
According to [facility] and staff at [facility] pt rolled out of bed. Pt is only c/o left foot pain.

## 2013-02-07 NOTE — ED Notes (Signed)
Called [id number] and Gardiner Ramus said that they were unable to come pick up pt until after 7. They had no transporter available.

## 2013-02-09 NOTE — Progress Notes (Signed)
UR chart review completed.  

## 2013-09-08 ENCOUNTER — Encounter (HOSPITAL_COMMUNITY): Payer: Self-pay | Admitting: Emergency Medicine

## 2013-09-08 ENCOUNTER — Emergency Department (HOSPITAL_COMMUNITY)
Admission: EM | Admit: 2013-09-08 | Discharge: 2013-09-09 | Disposition: A | Discharge status: Home / Self Care | Payer: Medicare PPO | Attending: Emergency Medicine | Admitting: Emergency Medicine

## 2013-09-08 ENCOUNTER — Emergency Department (HOSPITAL_COMMUNITY): Payer: Medicare PPO

## 2013-09-08 DIAGNOSIS — I252 Old myocardial infarction: Secondary | ICD-10-CM | POA: Insufficient documentation

## 2013-09-08 DIAGNOSIS — I5020 Unspecified systolic (congestive) heart failure: Secondary | ICD-10-CM | POA: Insufficient documentation

## 2013-09-08 DIAGNOSIS — Z79899 Other long term (current) drug therapy: Secondary | ICD-10-CM | POA: Insufficient documentation

## 2013-09-08 DIAGNOSIS — S40022A Contusion of left upper arm, initial encounter: Secondary | ICD-10-CM

## 2013-09-08 DIAGNOSIS — W06XXXA Fall from bed, initial encounter: Secondary | ICD-10-CM | POA: Insufficient documentation

## 2013-09-08 DIAGNOSIS — S79919A Unspecified injury of unspecified hip, initial encounter: Secondary | ICD-10-CM | POA: Insufficient documentation

## 2013-09-08 DIAGNOSIS — W19XXXA Unspecified fall, initial encounter: Secondary | ICD-10-CM

## 2013-09-08 DIAGNOSIS — J449 Chronic obstructive pulmonary disease, unspecified: Secondary | ICD-10-CM | POA: Insufficient documentation

## 2013-09-08 DIAGNOSIS — Y939 Activity, unspecified: Secondary | ICD-10-CM | POA: Insufficient documentation

## 2013-09-08 DIAGNOSIS — F0390 Unspecified dementia without behavioral disturbance: Secondary | ICD-10-CM | POA: Insufficient documentation

## 2013-09-08 DIAGNOSIS — Y9210 Unspecified residential institution as the place of occurrence of the external cause: Secondary | ICD-10-CM | POA: Insufficient documentation

## 2013-09-08 DIAGNOSIS — S40012A Contusion of left shoulder, initial encounter: Secondary | ICD-10-CM

## 2013-09-08 DIAGNOSIS — S40019A Contusion of unspecified shoulder, initial encounter: Secondary | ICD-10-CM | POA: Insufficient documentation

## 2013-09-08 DIAGNOSIS — Z8739 Personal history of other diseases of the musculoskeletal system and connective tissue: Secondary | ICD-10-CM | POA: Insufficient documentation

## 2013-09-08 DIAGNOSIS — S79929A Unspecified injury of unspecified thigh, initial encounter: Secondary | ICD-10-CM

## 2013-09-08 DIAGNOSIS — I10 Essential (primary) hypertension: Secondary | ICD-10-CM | POA: Insufficient documentation

## 2013-09-08 DIAGNOSIS — J4489 Other specified chronic obstructive pulmonary disease: Secondary | ICD-10-CM | POA: Insufficient documentation

## 2013-09-08 DIAGNOSIS — Z7902 Long term (current) use of antithrombotics/antiplatelets: Secondary | ICD-10-CM | POA: Insufficient documentation

## 2013-09-08 DIAGNOSIS — Z8673 Personal history of transient ischemic attack (TIA), and cerebral infarction without residual deficits: Secondary | ICD-10-CM | POA: Insufficient documentation

## 2013-09-08 NOTE — ED Provider Notes (Signed)
CSN: [id number]     Arrival date & time 09/08/13  1740 History   First MD Initiated Contact with Patient 09/08/13 1804     Chief Complaint  Patient presents with  . Fall   (Consider location/radiation/quality/duration/timing/severity/associated sxs/prior Treatment) HPI This is a 80 year old female from a nursing home who is nonverbal and presents with reports that she was found on the floor by the bed. She is unable to answer my questions. Nursing note had read that they thought she might have some pain in her hip and her left shoulder. I reviewed old records and she was nonverbal on previous evaluations in the emergency department. Past Medical History  Diagnosis Date  . CVA (cerebral vascular accident)   . Hypertension   . Dementia   . Osteoporosis   . MI (myocardial infarction)   . COPD (chronic obstructive pulmonary disease)   . Systolic HF (heart failure)     5/14   History reviewed. No pertinent past surgical history. Family History  Problem Relation Age of Onset  . Heart failure Sister   . Heart attack Sister    History  Substance Use Topics  . Smoking status: Never Smoker   . Smokeless tobacco: Never Used  . Alcohol Use: No   OB History   Grav Para Term Preterm Abortions TAB SAB Ect Mult Living                 Review of Systems  Unable to perform ROS   Allergies  Review of patient's allergies indicates no known allergies.  Home Medications   Current Outpatient Rx  Name  Route  Sig  Dispense  Refill  . clopidogrel (PLAVIX) 75 MG tablet   Oral   Take 75 mg by mouth daily.           . Eyelid Cleansers (OCUSOFT LID SCRUB EX)   Apply externally   Apply topically 2 (two) times daily.         . ferrous sulfate 325 (65 FE) MG tablet   Oral   Take 325 mg by mouth daily with breakfast. Crush and mix with applesauce.         Marland Kitchen. HYDROcodone-acetaminophen (NORCO/VICODIN) 5-325 MG per tablet   Oral   Take 1 tablet by mouth 2 (two) times daily.          . metoprolol succinate (TOPROL-XL) 25 MG 24 hr tablet   Oral   Take 25 mg by mouth daily.           Marland Kitchen. spironolactone (ALDACTONE) 12.5 mg TABS   Oral   Take 0.5 tablets (12.5 mg total) by mouth daily.   30 tablet   5    BP 141/61  Pulse 76  Temp(Src) 97.7 F (36.5 C) (Oral)  Resp 20  SpO2 93% Physical Exam  Nursing note and vitals reviewed. Constitutional: She appears well-developed and well-nourished.  HENT:  Head: Normocephalic and atraumatic.  Right Ear: External ear normal.  Eyes: Conjunctivae and EOM are normal. Pupils are equal, round, and reactive to light.  Neck: Normal range of motion. Neck supple.  Cardiovascular: Normal rate and regular rhythm.   Pulmonary/Chest: Effort normal and breath sounds normal.  Abdominal: Soft. Bowel sounds are normal.  Musculoskeletal:  Patient pain with palpation of the groin area. She has full range of motion of both of her hips when I range them. She has an abrasion of the right knee but does not appear to have any tenderness palpation there are  and unable to move her knee full through a full range of motion. Chest has a area of redness on the left shoulder.  Neurological: She is alert. She has normal reflexes.  Everything appears to move equally but she does not follow commands and hold her legs in a flexed position. Deep tendon reflexes are equal bilaterally    ED Course  Procedures (including critical care time) Labs Review Labs Reviewed - No data to display Imaging Review No results found.  EKG Interpretation   None       MDM  No diagnosis found.  80 year old dementia from a rest home with reports that she was found on the ground. I had performed x-rays of areas appeared contused or tender and there does not appear to be any fracture. She is discharged back to her rest.  Hilario Quarry, MD 09/08/13 2210

## 2013-09-08 NOTE — Discharge Instructions (Signed)
Fall Prevention and Home Safety °Falls cause injuries and can affect all age groups. It is possible to use preventive measures to significantly decrease the likelihood of falls. There are many simple measures which can make your home safer and prevent falls. °OUTDOORS °· Repair cracks and edges of walkways and driveways. °· Remove high doorway thresholds. °· Trim shrubbery on the main path into your home. °· Have good outside lighting. °· Clear walkways of tools, rocks, debris, and clutter. °· Check that handrails are not broken and are securely fastened. Both sides of steps should have handrails. °· Have leaves, snow, and ice cleared regularly. °· Use sand or salt on walkways during winter months. °· In the garage, clean up grease or oil spills. °BATHROOM °· Install night lights. °· Install grab bars by the toilet and in the tub and shower. °· Use non-skid mats or decals in the tub or shower. °· Place a plastic non-slip stool in the shower to sit on, if needed. °· Keep floors dry and clean up all water on the floor immediately. °· Remove soap buildup in the tub or shower on a regular basis. °· Secure bath mats with non-slip, double-sided rug tape. °· Remove throw rugs and tripping hazards from the floors. °BEDROOMS °· Install night lights. °· Make sure a bedside light is easy to reach. °· Do not use oversized bedding. °· Keep a telephone by your bedside. °· Have a firm chair with side arms to use for getting dressed. °· Remove throw rugs and tripping hazards from the floor. °KITCHEN °· Keep handles on pots and pans turned toward the center of the stove. Use back burners when possible. °· Clean up spills quickly and allow time for drying. °· Avoid walking on wet floors. °· Avoid hot utensils and knives. °· Position shelves so they are not too high or low. °· Place commonly used objects within easy reach. °· If necessary, use a sturdy step stool with a grab bar when reaching. °· Keep electrical cables out of the  way. °· Do not use floor polish or wax that makes floors slippery. If you must use wax, use non-skid floor wax. °· Remove throw rugs and tripping hazards from the floor. °STAIRWAYS °· Never leave objects on stairs. °· Place handrails on both sides of stairways and use them. Fix any loose handrails. Make sure handrails on both sides of the stairways are as long as the stairs. °· Check carpeting to make sure it is firmly attached along stairs. Make repairs to worn or loose carpet promptly. °· Avoid placing throw rugs at the top or bottom of stairways, or properly secure the rug with carpet tape to prevent slippage. Get rid of throw rugs, if possible. °· Have an electrician put in a light switch at the top and bottom of the stairs. °OTHER FALL PREVENTION TIPS °· Wear low-heel or rubber-soled shoes that are supportive and fit well. Wear closed toe shoes. °· When using a stepladder, make sure it is fully opened and both spreaders are firmly locked. Do not climb a closed stepladder. °· Add color or contrast paint or tape to grab bars and handrails in your home. Place contrasting color strips on first and last steps. °· Learn and use mobility aids as needed. Install an electrical emergency response system. °· Turn on lights to avoid dark areas. Replace light bulbs that burn out immediately. Get light switches that glow. °· Arrange furniture to create clear pathways. Keep furniture in the same place. °·   Firmly attach carpet with non-skid or double-sided tape. °· Eliminate uneven floor surfaces. °· Select a carpet pattern that does not visually hide the edge of steps. °· Be aware of all pets. °OTHER HOME SAFETY TIPS °· Set the water temperature for 120° F (48.8° C). °· Keep emergency numbers on or near the telephone. °· Keep smoke detectors on every level of the home and near sleeping areas. °Document Released: 08/15/2002 Document Revised: 02/24/2012 Document Reviewed: 11/14/2011 °[employer]® Patient Information ©2014  [employer]. ° ° °Contusion °A contusion is a deep bruise. Contusions are the result of an injury that caused bleeding under the skin. The contusion may turn blue, purple, or yellow. Minor injuries will give you a painless contusion, but more severe contusions may stay painful and swollen for a few weeks.  °CAUSES  °A contusion is usually caused by a blow, trauma, or direct force to an area of the body. °SYMPTOMS  °· Swelling and redness of the injured area. °· Bruising of the injured area. °· Tenderness and soreness of the injured area. °· Pain. °DIAGNOSIS  °The diagnosis can be made by taking a history and physical exam. An X-Gideon Burstein, CT scan, or MRI may be needed to determine if there were any associated injuries, such as fractures. °TREATMENT  °Specific treatment will depend on what area of the body was injured. In general, the best treatment for a contusion is resting, icing, elevating, and applying cold compresses to the injured area. Over-the-counter medicines may also be recommended for pain control. Ask your caregiver what the best treatment is for your contusion. °HOME CARE INSTRUCTIONS  °· Put ice on the injured area. °· Put ice in a plastic bag. °· Place a towel between your skin and the bag. °· Leave the ice on for 15-20 minutes, 03-04 times a day. °· Only take over-the-counter or prescription medicines for pain, discomfort, or fever as directed by your caregiver. Your caregiver may recommend avoiding anti-inflammatory medicines (aspirin, ibuprofen, and naproxen) for 48 hours because these medicines may increase bruising. °· Rest the injured area. °· If possible, elevate the injured area to reduce swelling. °SEEK IMMEDIATE MEDICAL CARE IF:  °· You have increased bruising or swelling. °· You have pain that is getting worse. °· Your swelling or pain is not relieved with medicines. °MAKE SURE YOU:  °· Understand these instructions. °· Will watch your condition. °· Will get help right away if you are not  doing well or get worse. °Document Released: 06/04/2005 Document Revised: 11/17/2011 Document Reviewed: 06/30/2011 °ExitCare® Patient Information ©2014 [employer]. ° °

## 2013-09-08 NOTE — ED Notes (Signed)
[facility]  Staff found her laying in the floor, unwitnessed fall.   Pt does not talk.  Will nod head yes or no to questions.  Nods yes to left shoulder and left hip pain.

## 2013-09-08 NOTE — ED Notes (Signed)
[facility].  Facility to send staff to pick pt up.  Pt showing no signs of distress.

## 2013-09-09 NOTE — ED Notes (Signed)
[facility] in care of caregiver.  [facility] by Mendel CorningJ Johnson, RN.

## 2013-12-14 IMAGING — CR DG ABDOMEN 1V
1 series · 1 of 1 positions shown · non-contrast
Comparison: None.

CLINICAL DATA: Absent bowel sounds

ABDOMEN - 1 VIEW

[view not recorded]
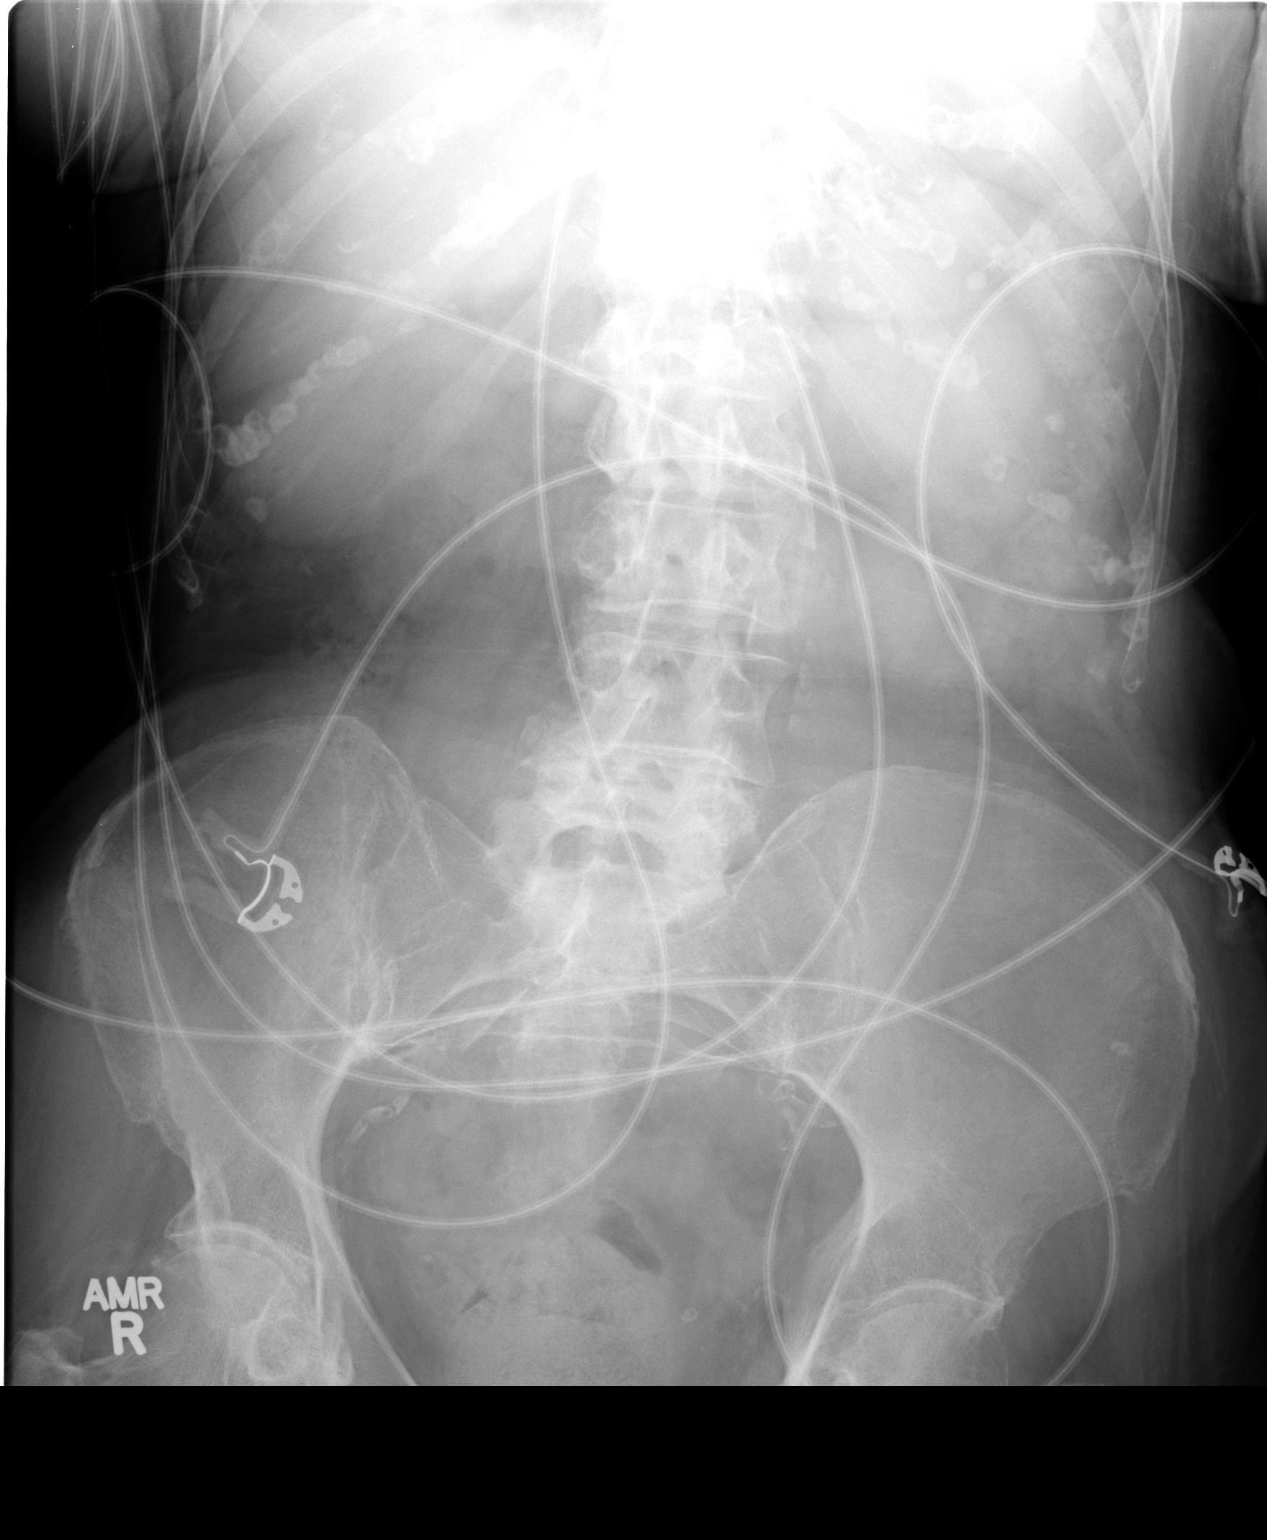

[1 of 1 positions shown; findings below may reference images not displayed]

FINDINGS: Paucity of bowel gas without dilated loops of small bowel
to suggest small bowel obstruction.

Degenerative changes of the visualized thoracolumbar spine.
IMPRESSION: Unremarkable abdominal radiograph.

## 2013-12-18 IMAGING — CR DG FOOT COMPLETE 3+V*L*
3 series · 3 of 3 positions shown · non-contrast
Comparison: Left ankle radiographs performed 08/14/2006

CLINICAL DATA: Status post fall; left foot pain.

LEFT FOOT - COMPLETE 3+ VIEW

[view not recorded (1 of 3)]
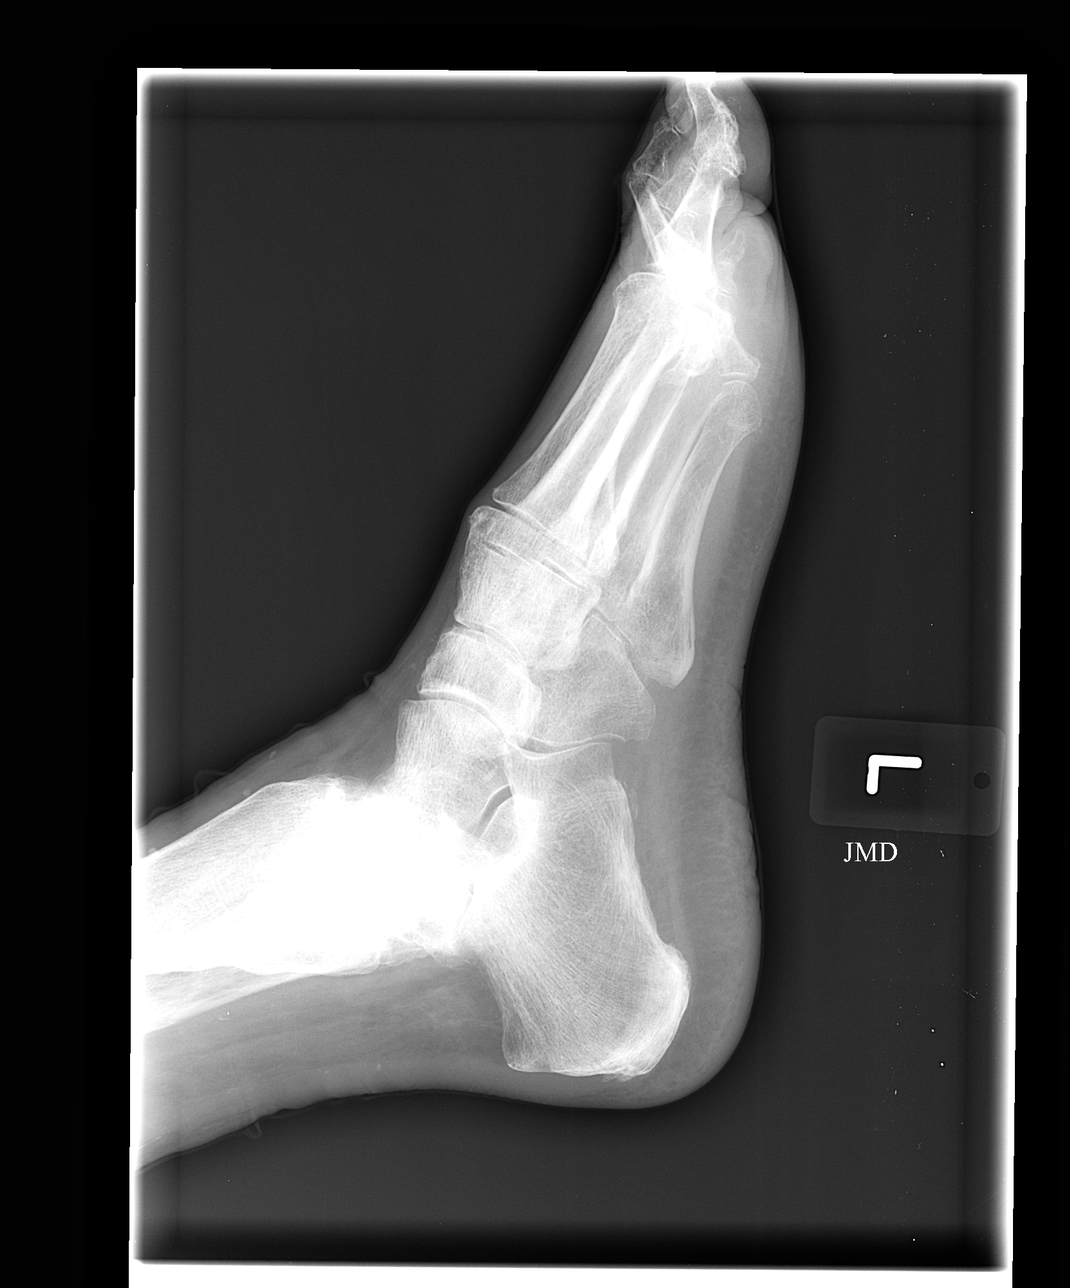

[view not recorded (2 of 3)]
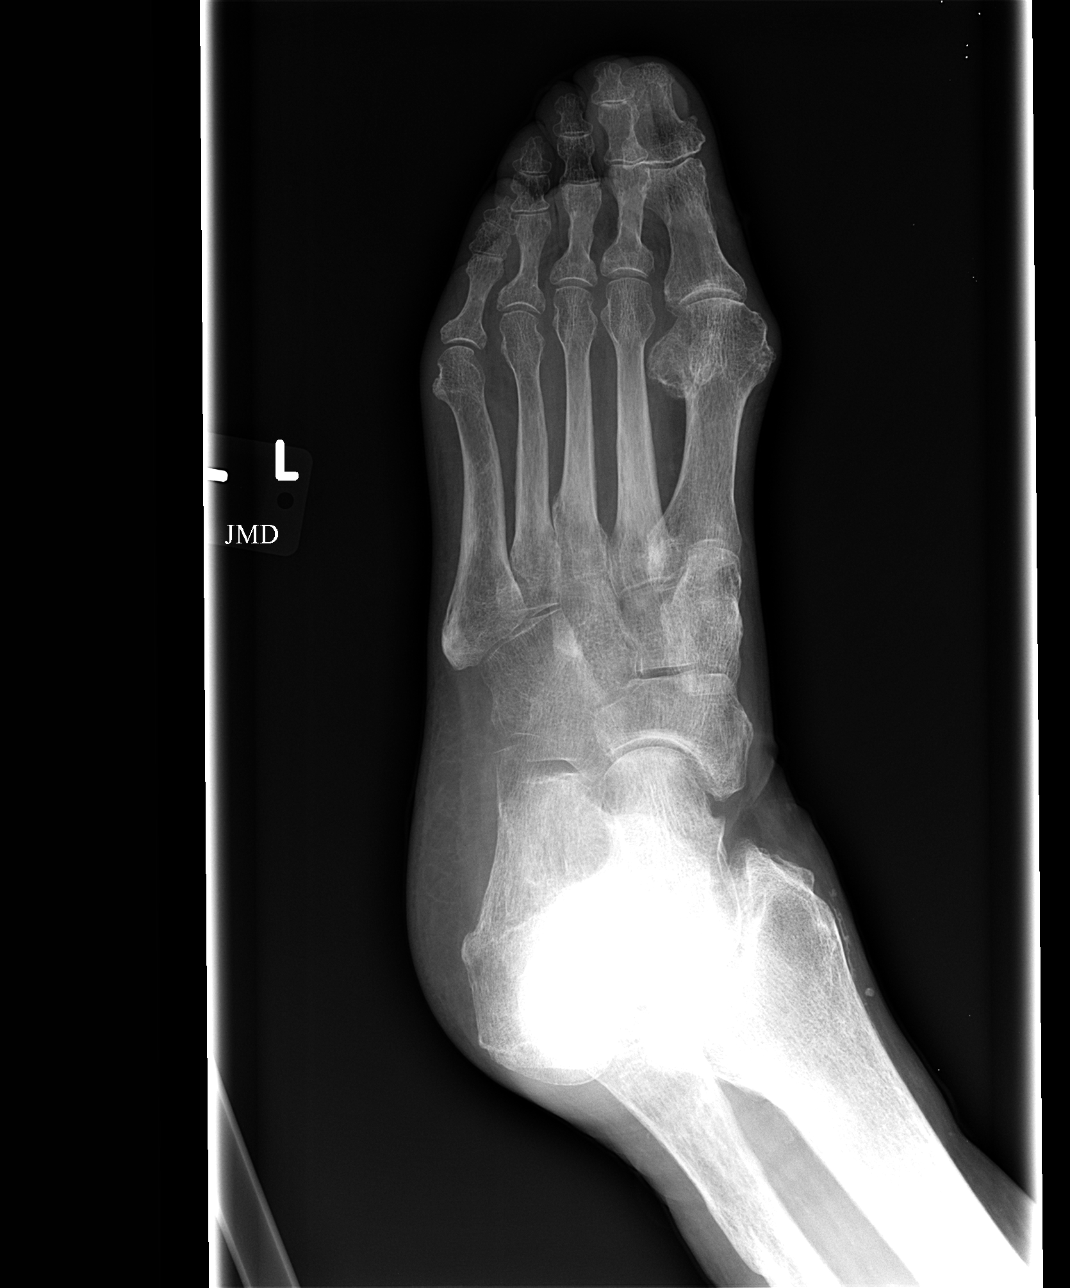

[view not recorded (3 of 3)]
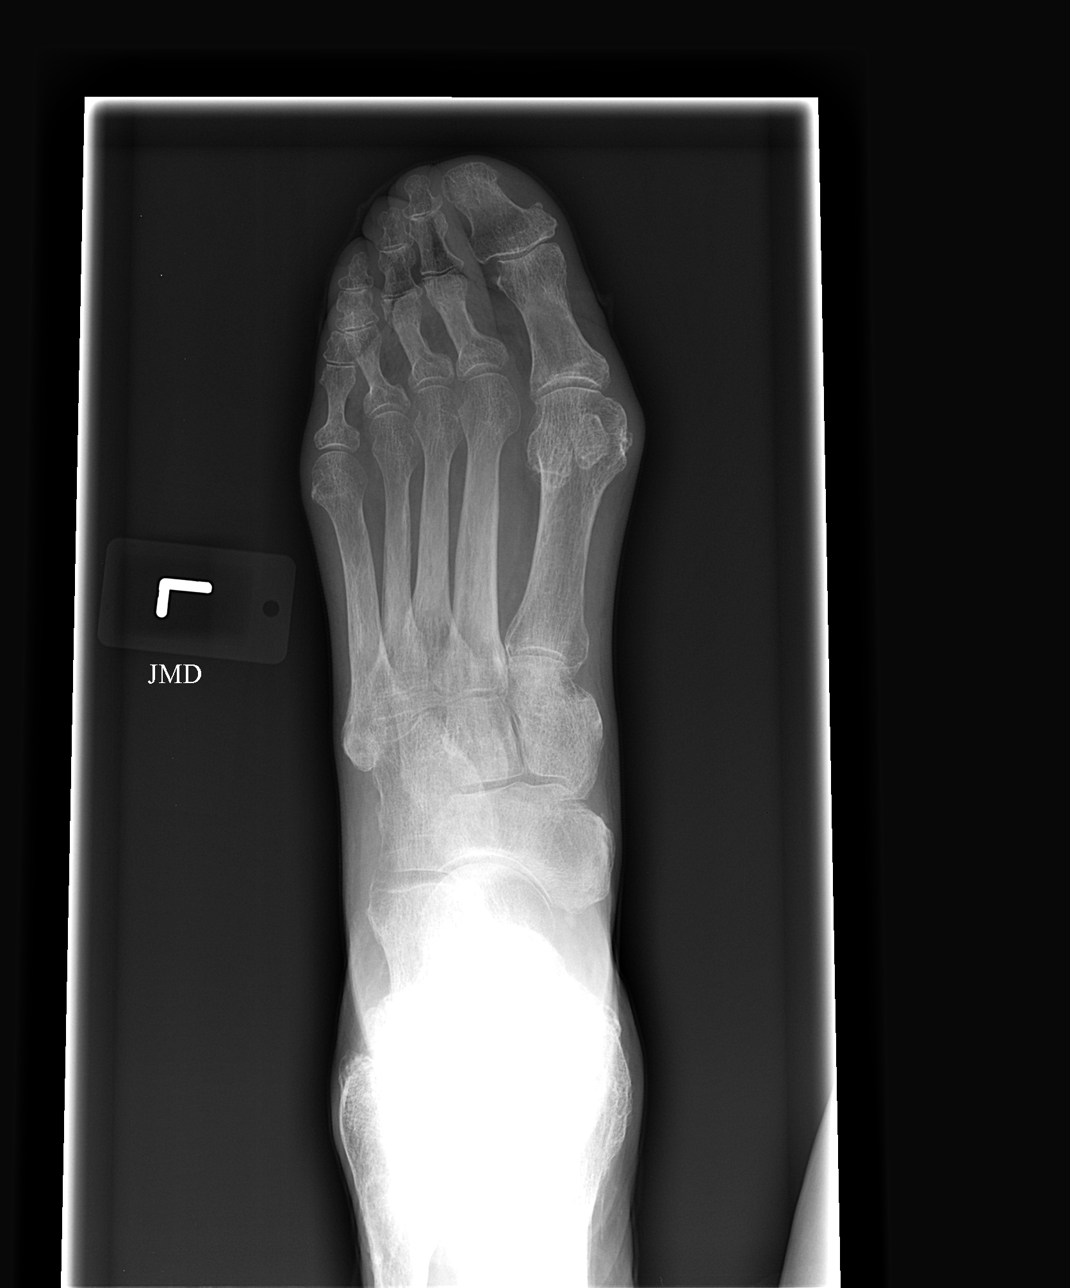

[3 of 3 positions shown; findings below may reference images not displayed]

FINDINGS: There is no evidence of fracture or dislocation.  There
is mild chronic deformity involving the distal tibia.  The joint
spaces are preserved.  There is no evidence of talar subluxation;
the subtalar joint is unremarkable in appearance.  A small
posterior calcaneal spur is incidentally seen.

No significant soft tissue abnormalities are seen.
IMPRESSION: No evidence of fracture or dislocation.

## 2014-04-16 ENCOUNTER — Emergency Department (HOSPITAL_COMMUNITY): Payer: Medicare PPO

## 2014-04-16 ENCOUNTER — Encounter (HOSPITAL_COMMUNITY): Payer: Self-pay | Admitting: Emergency Medicine

## 2014-04-16 ENCOUNTER — Emergency Department (HOSPITAL_COMMUNITY)
Admission: EM | Admit: 2014-04-16 | Discharge: 2014-04-16 | Disposition: A | Discharge status: Home / Self Care | Payer: Medicare PPO | Attending: Emergency Medicine | Admitting: Emergency Medicine

## 2014-04-16 DIAGNOSIS — Z8739 Personal history of other diseases of the musculoskeletal system and connective tissue: Secondary | ICD-10-CM | POA: Insufficient documentation

## 2014-04-16 DIAGNOSIS — I252 Old myocardial infarction: Secondary | ICD-10-CM | POA: Insufficient documentation

## 2014-04-16 DIAGNOSIS — Z8673 Personal history of transient ischemic attack (TIA), and cerebral infarction without residual deficits: Secondary | ICD-10-CM | POA: Insufficient documentation

## 2014-04-16 DIAGNOSIS — I10 Essential (primary) hypertension: Secondary | ICD-10-CM | POA: Insufficient documentation

## 2014-04-16 DIAGNOSIS — Y9210 Unspecified residential institution as the place of occurrence of the external cause: Secondary | ICD-10-CM | POA: Diagnosis not present

## 2014-04-16 DIAGNOSIS — S0093XA Contusion of unspecified part of head, initial encounter: Secondary | ICD-10-CM

## 2014-04-16 DIAGNOSIS — Z79899 Other long term (current) drug therapy: Secondary | ICD-10-CM | POA: Diagnosis not present

## 2014-04-16 DIAGNOSIS — S0083XA Contusion of other part of head, initial encounter: Principal | ICD-10-CM | POA: Insufficient documentation

## 2014-04-16 DIAGNOSIS — F0390 Unspecified dementia without behavioral disturbance: Secondary | ICD-10-CM | POA: Insufficient documentation

## 2014-04-16 DIAGNOSIS — Y9389 Activity, other specified: Secondary | ICD-10-CM | POA: Insufficient documentation

## 2014-04-16 DIAGNOSIS — J449 Chronic obstructive pulmonary disease, unspecified: Secondary | ICD-10-CM | POA: Diagnosis not present

## 2014-04-16 DIAGNOSIS — W1809XA Striking against other object with subsequent fall, initial encounter: Secondary | ICD-10-CM | POA: Insufficient documentation

## 2014-04-16 DIAGNOSIS — S0003XA Contusion of scalp, initial encounter: Secondary | ICD-10-CM | POA: Diagnosis not present

## 2014-04-16 DIAGNOSIS — Z7902 Long term (current) use of antithrombotics/antiplatelets: Secondary | ICD-10-CM | POA: Diagnosis not present

## 2014-04-16 DIAGNOSIS — S0990XA Unspecified injury of head, initial encounter: Secondary | ICD-10-CM | POA: Diagnosis present

## 2014-04-16 DIAGNOSIS — I5020 Unspecified systolic (congestive) heart failure: Secondary | ICD-10-CM | POA: Insufficient documentation

## 2014-04-16 DIAGNOSIS — S1093XA Contusion of unspecified part of neck, initial encounter: Principal | ICD-10-CM

## 2014-04-16 DIAGNOSIS — J4489 Other specified chronic obstructive pulmonary disease: Secondary | ICD-10-CM | POA: Insufficient documentation

## 2014-04-16 NOTE — ED Provider Notes (Signed)
CSN: [id number]     Arrival date & time 04/16/14  0534 History   First MD Initiated Contact with Patient 04/16/14 0542     Chief Complaint  Patient presents with  . Fall   Level V caveat for dementia  (Consider location/radiation/quality/duration/timing/severity/associated sxs/prior Treatment) HPI patient presents via EMS from her nursing home. Per EMS the nursing home staff was bathing patient she fell in the shower hitting her head. There was no loss of consciousness. Patient is on Plavix and has a bruise to her forehead. She was sent to the ED for evaluation. Patient is sleeping and does not answer questions.  PCP Dr Renard MatterMcInnis  Past Medical History  Diagnosis Date  . CVA (cerebral vascular accident)   . Hypertension   . Dementia   . Osteoporosis   . MI (myocardial infarction)   . COPD (chronic obstructive pulmonary disease)   . Systolic HF (heart failure)     5/14   History reviewed. No pertinent past surgical history. Family History  Problem Relation Age of Onset  . Heart failure Sister   . Heart attack Sister    History  Substance Use Topics  . Smoking status: Never Smoker   . Smokeless tobacco: Never Used  . Alcohol Use: No   Lives in NH  OB History   Grav Para Term Preterm Abortions TAB SAB Ect Mult Living                 Review of Systems  Unable to perform ROS: Dementia      Allergies  Review of patient's allergies indicates no known allergies.  Home Medications   Prior to Admission medications   Medication Sig Start Date End Date Taking? Authorizing Provider  clopidogrel (PLAVIX) 75 MG tablet Take 75 mg by mouth daily.     Yes Historical Provider, MD  Eyelid Cleansers (OCUSOFT LID SCRUB EX) Apply topically 2 (two) times daily.   Yes Historical Provider, MD  ferrous sulfate 325 (65 FE) MG tablet Take 325 mg by mouth daily with breakfast. Crush and mix with applesauce.   Yes Historical Provider, MD  HYDROcodone-acetaminophen (NORCO/VICODIN) 5-325 MG  per tablet Take 1 tablet by mouth 2 (two) times daily.   Yes Historical Provider, MD  metoprolol succinate (TOPROL-XL) 25 MG 24 hr tablet Take 25 mg by mouth daily.     Yes Historical Provider, MD  spironolactone (ALDACTONE) 12.5 mg TABS Take 0.5 tablets (12.5 mg total) by mouth daily. 02/05/13  Yes Angus G McInnis, MD   BP 142/73  Pulse 56  Temp(Src) 97.5 F (36.4 C) (Oral)  Resp 22  SpO2 99%  Vital signs normal except for bradycardia  Physical Exam  Nursing note and vitals reviewed. Constitutional:  Non-toxic appearance. She does not appear ill. No distress.  Frail elderly female who was sleeping. She does arouse when her extremities are manipulated however she does not fully wake up.  HENT:  Head: Normocephalic.  Right Ear: External ear normal.  Left Ear: External ear normal.  Nose: Nose normal. No mucosal edema or rhinorrhea.  Mouth/Throat: Oropharynx is clear and moist and mucous membranes are normal. No dental abscesses or uvula swelling.  Patient has a large bruise on her left forehead, please see photo.  Eyes: Conjunctivae and EOM are normal. Pupils are equal, round, and reactive to light.  Neck: Normal range of motion and full passive range of motion without pain. Neck supple.  Cardiovascular: Normal rate, regular rhythm and normal heart sounds.  Exam  reveals no gallop and no friction rub.   No murmur heard. Pulmonary/Chest: Effort normal and breath sounds normal. No respiratory distress. She has no wheezes. She has no rhonchi. She has no rales. She exhibits no tenderness and no crepitus.  Abdominal: Soft. Normal appearance and bowel sounds are normal. She exhibits no distension. There is no tenderness. There is no rebound and no guarding.  Musculoskeletal: Normal range of motion. She exhibits no edema and no tenderness.  Moves all extremities well. Patient does not appear to have any pain on range of motion of her extremities  Neurological: She has normal strength. No cranial  nerve deficit.  Patient appears to have equal strength in her extremities  Skin: Skin is warm, dry and intact. No rash noted. No erythema. No pallor.  Psychiatric: Her speech is normal. Her mood appears not anxious.  Sleeping, does not answer questions or follow commands      ED Course  Procedures (including critical care time)  07:10 Pt arousable, states she feels fine.    Labs Review Labs Reviewed - No data to display  Imaging Review Ct Head Wo Contrast  04/16/2014   CLINICAL DATA:  Status post fall; hit head, with left forehead scalp hematoma.  EXAM: CT HEAD WITHOUT CONTRAST  TECHNIQUE: Contiguous axial images were obtained from the base of the skull through the vertex without intravenous contrast.  COMPARISON:  CT of the head performed 02/01/2013  FINDINGS: There is no evidence of acute infarction, mass lesion, or intra- or extra-axial hemorrhage on CT.  Prominence of the ventricles and sulci reflects moderate cortical volume loss. Cerebellar atrophy is noted. Chronic encephalomalacia is noted at the right cerebellar hemisphere, reflecting remote infarct.  Scattered periventricular and subcortical white matter change likely reflects small vessel ischemic microangiopathy. Chronic lacunar infarcts are noted at the corona radiata bilaterally. A large chronic lacunar infarct is also seen at the right basal ganglia.  The brainstem and fourth ventricle are within normal limits. The cerebral hemispheres demonstrate grossly normal gray-white differentiation. No mass effect or midline shift is seen.  There is no evidence of fracture; visualized osseous structures are unremarkable in appearance. The visualized portions of the orbits are within normal limits. The paranasal sinuses and mastoid air cells are well-aerated. Soft tissue swelling is noted overlying the left frontal calvarium.  IMPRESSION: 1. No acute intracranial pathology seen on CT. 2. Moderate cortical volume loss and scattered small vessel  ischemic microangiopathy. 3. Chronic encephalomalacia at the right cerebellar hemisphere, reflecting remote infarct. Chronic lacunar infarcts at the corona radiata bilaterally, and at the right basal ganglia. 4. Soft tissue swelling noted overlying the left frontal calvarium.   Electronically Signed   By: Roanna Raider M.D.   On: 04/16/2014 07:07     EKG Interpretation None      MDM   Final diagnoses:  Head contusion, initial encounter    [name] Albe, MD, Franz Dell, MD 04/16/14 [phone]

## 2014-04-16 NOTE — ED Notes (Signed)
Pt resting with eyes shut.  arrouses when you call her name.

## 2014-04-16 NOTE — ED Notes (Signed)
[facility] Pt was with staff bathing when she fell in shower hitting head. No LOC pt sent for evaluation.

## 2014-04-16 NOTE — ED Notes (Signed)
Per EMS pt does not speak but does understand what you are saying. Pt has hematoma to the left forehead.

## 2014-04-16 NOTE — Discharge Instructions (Signed)
Her CT scan does not show any acute changes other than the hematoma on her forehead. Have her rechecked for any problems listed on the head injury sheet.  Head Injury You have received a head injury. It does not appear serious at this time. Headaches and vomiting are common following head injury. It should be easy to awaken from sleeping. Sometimes it is necessary for you to stay in the emergency department for a while for observation. Sometimes admission to the hospital may be needed. After injuries such as yours, most problems occur within the first 24 hours, but side effects may occur up to 7-10 days after the injury. It is important for you to carefully monitor your condition and contact your health care provider or seek immediate medical care if there is a change in your condition. WHAT ARE THE TYPES OF HEAD INJURIES? Head injuries can be as minor as a bump. Some head injuries can be more severe. More severe head injuries include:  A jarring injury to the brain (concussion).  A bruise of the brain (contusion). This mean there is bleeding in the brain that can cause swelling.  A cracked skull (skull fracture).  Bleeding in the brain that collects, clots, and forms a bump (hematoma). WHAT CAUSES A HEAD INJURY? A serious head injury is most likely to happen to someone who is in a car wreck and is not wearing a seat belt. Other causes of major head injuries include bicycle or motorcycle accidents, sports injuries, and falls. HOW ARE HEAD INJURIES DIAGNOSED? A complete history of the event leading to the injury and your current symptoms will be helpful in diagnosing head injuries. Many times, pictures of the brain, such as CT or MRI are needed to see the extent of the injury. Often, an overnight hospital stay is necessary for observation.  WHEN SHOULD I SEEK IMMEDIATE MEDICAL CARE?  You should get help right away if:  You have confusion or drowsiness.  You feel sick to your stomach (nauseous)  or have continued, forceful vomiting.  You have dizziness or unsteadiness that is getting worse.  You have severe, continued headaches not relieved by medicine. Only take over-the-counter or prescription medicines for pain, fever, or discomfort as directed by your health care provider.  You do not have normal function of the arms or legs or are unable to walk.  You notice changes in the black spots in the center of the colored part of your eye (pupil).  You have a clear or bloody fluid coming from your nose or ears.  You have a loss of vision. During the next 24 hours after the injury, you must stay with someone who can watch you for the warning signs. This person should contact local emergency services (911 in the [place] if you have seizures, you become unconscious, or you are unable to wake up. HOW CAN I PREVENT A HEAD INJURY IN THE FUTURE? The most important factor for preventing major head injuries is avoiding motor vehicle accidents. To minimize the potential for damage to your head, it is crucial to wear seat belts while riding in motor vehicles. Wearing helmets while bike riding and playing collision sports (like football) is also helpful. Also, avoiding dangerous activities around the house will further help reduce your risk of head injury.  WHEN CAN I RETURN TO NORMAL ACTIVITIES AND ATHLETICS? You should be reevaluated by your health care provider before returning to these activities. If you have any of the following symptoms, you should not  return to activities or contact sports until 1 week after the symptoms have stopped:  Persistent headache.  Dizziness or vertigo.  Poor attention and concentration.  Confusion.  Memory problems.  Nausea or vomiting.  Fatigue or tire easily.  Irritability.  Intolerant of bright lights or loud noises.  Anxiety or depression.  Disturbed sleep. MAKE SURE YOU:   Understand these instructions.  Will watch your condition.  Will get  help right away if you are not doing well or get worse. Document Released: 08/25/2005 Document Revised: 08/30/2013 Document Reviewed: 05/02/2013 [facility] Patient Information 2015 [employer], [place]. This information is not intended to replace advice given to you by your health care provider. Make sure you discuss any questions you have with your health care provider.

## 2014-05-21 ENCOUNTER — Encounter (HOSPITAL_COMMUNITY): Payer: Self-pay | Admitting: Emergency Medicine

## 2014-05-21 ENCOUNTER — Inpatient Hospital Stay (HOSPITAL_COMMUNITY)
Admission: EM | Admit: 2014-05-21 | Discharge: 2014-05-25 | DRG: 536 | Disposition: A | Discharge status: Skilled Nursing Facility | Payer: Medicare PPO | Attending: Family Medicine | Admitting: Family Medicine

## 2014-05-21 ENCOUNTER — Emergency Department (HOSPITAL_COMMUNITY): Payer: Medicare PPO

## 2014-05-21 DIAGNOSIS — S22009A Unspecified fracture of unspecified thoracic vertebra, initial encounter for closed fracture: Secondary | ICD-10-CM | POA: Diagnosis present

## 2014-05-21 DIAGNOSIS — S72033A Displaced midcervical fracture of unspecified femur, initial encounter for closed fracture: Principal | ICD-10-CM | POA: Diagnosis present

## 2014-05-21 DIAGNOSIS — J449 Chronic obstructive pulmonary disease, unspecified: Secondary | ICD-10-CM | POA: Diagnosis present

## 2014-05-21 DIAGNOSIS — G309 Alzheimer's disease, unspecified: Secondary | ICD-10-CM | POA: Diagnosis present

## 2014-05-21 DIAGNOSIS — I509 Heart failure, unspecified: Secondary | ICD-10-CM | POA: Diagnosis present

## 2014-05-21 DIAGNOSIS — M810 Age-related osteoporosis without current pathological fracture: Secondary | ICD-10-CM | POA: Diagnosis present

## 2014-05-21 DIAGNOSIS — Z7902 Long term (current) use of antithrombotics/antiplatelets: Secondary | ICD-10-CM | POA: Diagnosis not present

## 2014-05-21 DIAGNOSIS — S72009A Fracture of unspecified part of neck of unspecified femur, initial encounter for closed fracture: Secondary | ICD-10-CM | POA: Insufficient documentation

## 2014-05-21 DIAGNOSIS — Y9210 Unspecified residential institution as the place of occurrence of the external cause: Secondary | ICD-10-CM | POA: Diagnosis present

## 2014-05-21 DIAGNOSIS — I2510 Atherosclerotic heart disease of native coronary artery without angina pectoris: Secondary | ICD-10-CM | POA: Diagnosis present

## 2014-05-21 DIAGNOSIS — R4701 Aphasia: Secondary | ICD-10-CM | POA: Diagnosis present

## 2014-05-21 DIAGNOSIS — R7989 Other specified abnormal findings of blood chemistry: Secondary | ICD-10-CM | POA: Diagnosis present

## 2014-05-21 DIAGNOSIS — I5022 Chronic systolic (congestive) heart failure: Secondary | ICD-10-CM | POA: Diagnosis present

## 2014-05-21 DIAGNOSIS — S72002A Fracture of unspecified part of neck of left femur, initial encounter for closed fracture: Secondary | ICD-10-CM | POA: Diagnosis present

## 2014-05-21 DIAGNOSIS — Z8673 Personal history of transient ischemic attack (TIA), and cerebral infarction without residual deficits: Secondary | ICD-10-CM | POA: Diagnosis not present

## 2014-05-21 DIAGNOSIS — Z8249 Family history of ischemic heart disease and other diseases of the circulatory system: Secondary | ICD-10-CM | POA: Diagnosis not present

## 2014-05-21 DIAGNOSIS — I10 Essential (primary) hypertension: Secondary | ICD-10-CM | POA: Diagnosis present

## 2014-05-21 DIAGNOSIS — F0390 Unspecified dementia without behavioral disturbance: Secondary | ICD-10-CM

## 2014-05-21 DIAGNOSIS — Z66 Do not resuscitate: Secondary | ICD-10-CM | POA: Diagnosis present

## 2014-05-21 DIAGNOSIS — F0280 Dementia in other diseases classified elsewhere without behavioral disturbance: Secondary | ICD-10-CM | POA: Diagnosis present

## 2014-05-21 DIAGNOSIS — I252 Old myocardial infarction: Secondary | ICD-10-CM | POA: Diagnosis not present

## 2014-05-21 DIAGNOSIS — S22080S Wedge compression fracture of T11-T12 vertebra, sequela: Secondary | ICD-10-CM

## 2014-05-21 DIAGNOSIS — I25810 Atherosclerosis of coronary artery bypass graft(s) without angina pectoris: Secondary | ICD-10-CM

## 2014-05-21 DIAGNOSIS — R3989 Other symptoms and signs involving the genitourinary system: Secondary | ICD-10-CM | POA: Diagnosis present

## 2014-05-21 DIAGNOSIS — Z79899 Other long term (current) drug therapy: Secondary | ICD-10-CM

## 2014-05-21 DIAGNOSIS — IMO0002 Reserved for concepts with insufficient information to code with codable children: Secondary | ICD-10-CM

## 2014-05-21 DIAGNOSIS — W19XXXA Unspecified fall, initial encounter: Secondary | ICD-10-CM | POA: Diagnosis present

## 2014-05-21 DIAGNOSIS — S22080A Wedge compression fracture of T11-T12 vertebra, initial encounter for closed fracture: Secondary | ICD-10-CM | POA: Diagnosis present

## 2014-05-21 DIAGNOSIS — I639 Cerebral infarction, unspecified: Secondary | ICD-10-CM | POA: Diagnosis present

## 2014-05-21 DIAGNOSIS — I5020 Unspecified systolic (congestive) heart failure: Secondary | ICD-10-CM | POA: Diagnosis present

## 2014-05-21 DIAGNOSIS — J4489 Other specified chronic obstructive pulmonary disease: Secondary | ICD-10-CM | POA: Diagnosis present

## 2014-05-21 DIAGNOSIS — J438 Other emphysema: Secondary | ICD-10-CM

## 2014-05-21 DIAGNOSIS — M25559 Pain in unspecified hip: Secondary | ICD-10-CM | POA: Diagnosis present

## 2014-05-21 LAB — CBC WITH DIFFERENTIAL/PLATELET
Basophils Absolute: 0 10*3/uL (ref 0.0–0.1)
Basophils Relative: 0 % (ref 0–1)
EOS ABS: 0.1 10*3/uL (ref 0.0–0.7)
EOS PCT: 1 % (ref 0–5)
HEMATOCRIT: 41.2 % (ref 36.0–46.0)
Hemoglobin: 14 g/dL (ref 12.0–15.0)
LYMPHS ABS: 1 10*3/uL (ref 0.7–4.0)
Lymphocytes Relative: 9 % — ABNORMAL LOW (ref 12–46)
MCH: 30.5 pg (ref 26.0–34.0)
MCHC: 34 g/dL (ref 30.0–36.0)
MCV: 89.8 fL (ref 78.0–100.0)
MONO ABS: 0.6 10*3/uL (ref 0.1–1.0)
Monocytes Relative: 5 % (ref 3–12)
Neutro Abs: 9.8 10*3/uL — ABNORMAL HIGH (ref 1.7–7.7)
Neutrophils Relative %: 85 % — ABNORMAL HIGH (ref 43–77)
PLATELETS: 182 10*3/uL (ref 150–400)
RBC: 4.59 MIL/uL (ref 3.87–5.11)
RDW: 14.4 % (ref 11.5–15.5)
WBC: 11.6 10*3/uL — ABNORMAL HIGH (ref 4.0–10.5)

## 2014-05-21 LAB — PROTIME-INR
INR: 1.05 (ref 0.00–1.49)
PROTHROMBIN TIME: 13.7 s (ref 11.6–15.2)

## 2014-05-21 LAB — BASIC METABOLIC PANEL
Anion gap: 15 (ref 5–15)
BUN: 27 mg/dL — AB (ref 6–23)
CALCIUM: 9.7 mg/dL (ref 8.4–10.5)
CO2: 23 meq/L (ref 19–32)
CREATININE: 0.68 mg/dL (ref 0.50–1.10)
Chloride: 101 mEq/L (ref 96–112)
GFR calc Af Amer: 85 mL/min — ABNORMAL LOW (ref >90)
GFR, EST NON AFRICAN AMERICAN: 74 mL/min — AB (ref >90)
GLUCOSE: 137 mg/dL — AB (ref 70–99)
Potassium: 4.5 mEq/L (ref 3.7–5.3)
SODIUM: 139 meq/L (ref 137–147)

## 2014-05-21 MED ORDER — ONDANSETRON HCL 4 MG/2ML IJ SOLN: 4.0000 mg | Freq: Four times a day (QID) | INTRAMUSCULAR | Status: DC | PRN

## 2014-05-21 MED ORDER — ALUM & MAG HYDROXIDE-SIMETH 200-200-20 MG/5ML PO SUSP: 30.0000 mL | Freq: Four times a day (QID) | ORAL | Status: DC | PRN

## 2014-05-21 MED ORDER — ONDANSETRON HCL 4 MG PO TABS: 4.0000 mg | ORAL_TABLET | Freq: Four times a day (QID) | ORAL | Status: DC | PRN

## 2014-05-21 MED ORDER — ENSURE COMPLETE PO LIQD
237.0000 mL | Freq: Three times a day (TID) | ORAL | Status: DC
  Administered 2014-05-22 – 2014-05-25 (×9): 237 mL via ORAL

## 2014-05-21 MED ORDER — BACITRACIN-NEOMYCIN-POLYMYXIN 400-5-5000 EX OINT
TOPICAL_OINTMENT | CUTANEOUS | Status: AC
  Filled 2014-05-21: qty 1

## 2014-05-21 MED ORDER — HYDROMORPHONE HCL PF 1 MG/ML IJ SOLN: 0.5000 mg | INTRAMUSCULAR | Status: DC | PRN

## 2014-05-21 MED ORDER — SODIUM CHLORIDE 0.9 % IV SOLN
INTRAVENOUS | Status: DC
  Administered 2014-05-22 – 2014-05-25 (×4): via INTRAVENOUS

## 2014-05-21 MED ORDER — ONDANSETRON HCL 4 MG/2ML IJ SOLN
4.0000 mg | Freq: Once | INTRAMUSCULAR | Status: AC
  Administered 2014-05-21: 4 mg via INTRAVENOUS
  Filled 2014-05-21: qty 2

## 2014-05-21 MED ORDER — FERROUS SULFATE 325 (65 FE) MG PO TABS
325.0000 mg | ORAL_TABLET | Freq: Every day | ORAL | Status: DC
  Administered 2014-05-22 – 2014-05-25 (×4): 325 mg via ORAL
  Filled 2014-05-21 (×4): qty 1

## 2014-05-21 MED ORDER — SPIRONOLACTONE 25 MG PO TABS
12.5000 mg | ORAL_TABLET | Freq: Every day | ORAL | Status: DC
  Administered 2014-05-22 – 2014-05-25 (×4): 12.5 mg via ORAL
  Filled 2014-05-21 (×4): qty 1

## 2014-05-21 MED ORDER — TOBRAMYCIN-DEXAMETHASONE 0.3-0.1 % OP OINT
1.0000 "application " | TOPICAL_OINTMENT | Freq: Four times a day (QID) | OPHTHALMIC | Status: DC
  Administered 2014-05-22 – 2014-05-25 (×13): 1 via OPHTHALMIC
  Filled 2014-05-21: qty 3.5

## 2014-05-21 MED ORDER — ACETAMINOPHEN 650 MG RE SUPP: 650.0000 mg | Freq: Four times a day (QID) | RECTAL | Status: DC | PRN

## 2014-05-21 MED ORDER — ACETAMINOPHEN 325 MG PO TABS: 650.0000 mg | ORAL_TABLET | Freq: Four times a day (QID) | ORAL | Status: DC | PRN

## 2014-05-21 MED ORDER — ENSURE PO LIQD: 1.0000 | Freq: Three times a day (TID) | ORAL | Status: DC

## 2014-05-21 MED ORDER — OXYCODONE HCL 5 MG PO TABS
5.0000 mg | ORAL_TABLET | ORAL | Status: DC | PRN
  Administered 2014-05-22 – 2014-05-25 (×3): 5 mg via ORAL
  Filled 2014-05-21 (×3): qty 1

## 2014-05-21 MED ORDER — METOPROLOL SUCCINATE ER 25 MG PO TB24
25.0000 mg | ORAL_TABLET | Freq: Every day | ORAL | Status: DC
  Administered 2014-05-22 – 2014-05-23 (×2): 25 mg via ORAL
  Filled 2014-05-21 (×2): qty 1

## 2014-05-21 MED ORDER — MORPHINE SULFATE 2 MG/ML IJ SOLN
2.0000 mg | Freq: Once | INTRAMUSCULAR | Status: AC
  Administered 2014-05-21: 2 mg via INTRAVENOUS
  Filled 2014-05-21: qty 1

## 2014-05-21 NOTE — ED Notes (Signed)
Placed call to [facility], pts son and POA. He is aware of her fall and I have advised him of her Fx hip. He would like the Orthopedist to call him after seeing her tomorrow.

## 2014-05-21 NOTE — ED Notes (Signed)
Nursing home staff found pt sitting on floor beside her bed, skin tear noted on left elbow. No other obvious injury.

## 2014-05-21 NOTE — H&P (Signed)
[facility] Admission History and Physical       [name]:[id number] DOB: [birth year] DOA: 05/21/2014  Referring physician:  EDP PCP: Alice Reichert, MD  Specialists:   Chief Complaint:  Found on Floor  HPI: [name] is a 80 y.o. female with a history of CAD, Systolic CHF, HTN, COPD, Osteoporosis, Dementia, and Previous CVA who is an resident of an area SNF who was found on the floor in her room in the afternoon.  She appeared to have pain in her left hip.   She was not witnessed as falling.    She was evaluated in the ED and was found to have a Left subcapsular femoral neck fracture.   She was referred for medical admission.   Orthopedics Dr Romeo Apple was consulted and is to see her in the AM.     Review of Systems:   Unable to Obtain from the Patient  Past Medical History  Diagnosis Date  . CVA (cerebral vascular accident)   . Hypertension   . Dementia   . Osteoporosis   . MI (myocardial infarction)   . COPD (chronic obstructive pulmonary disease)   . Systolic HF (heart failure)     5/14     History reviewed. No pertinent past surgical history.    Prior to Admission medications   Medication Sig Start Date End Date Taking? Authorizing Provider  clopidogrel (PLAVIX) 75 MG tablet Take 75 mg by mouth daily.     Yes Historical Provider, MD  clopidogrel (PLAVIX) 75 MG tablet Take 75 mg by mouth daily.   Yes Historical Provider, MD  Eyelid Cleansers (OCUSOFT LID SCRUB EX) Apply topically 2 (two) times daily.   Yes Historical Provider, MD  ferrous sulfate 325 (65 FE) MG tablet Take 325 mg by mouth daily with breakfast. Crush and mix with applesauce.   Yes Historical Provider, MD  HYDROcodone-acetaminophen (NORCO/VICODIN) 5-325 MG per tablet Take 0.5 tablets by mouth 2 (two) times daily as needed (pain).    Yes Historical Provider, MD  metoprolol succinate (TOPROL-XL) 25 MG 24 hr tablet Take 25 mg by mouth daily.     Yes Historical Provider, MD    Nutritional Supplements (ENSURE PO) Take 1 Can by mouth 3 (three) times daily with meals.   Yes Historical Provider, MD  spironolactone (ALDACTONE) 25 MG tablet Take 12.5 mg by mouth daily.   Yes Historical Provider, MD  tobramycin-dexamethasone Wallene Dales) ophthalmic ointment Place 1 application into the left eye every 6 (six) hours. For 2 weeks stop 05/25/14   Yes Historical Provider, MD      No Known Allergies   Social History:  reports that she has never smoked. She has never used smokeless tobacco. She reports that she does not drink alcohol or use illicit drugs.     Family History  Problem Relation Age of Onset  . Heart failure Sister   . Heart attack Sister        Physical Exam:  GEN:  Pleasant Cachectic appearing Elderly 80 y.o.  Caucasian female examined and in no acute distress; cooperative with exam Filed Vitals:   05/21/14 2026 05/21/14 2246 05/21/14 2258  BP: 183/116 119/69   Pulse: 117  92  Resp: Height:  (1.651 m)    Weight: 58.968 kg (130 lb)    SpO2: 97%  93%   Blood pressure 119/69, pulse 92, resp. rate 21, height  (1.651 m), weight 58.968 kg (130 lb), SpO2 93.00%. PSYCH:  She is alert and oriented x 1; does not appear anxious does not appear depressed; affect is normal HEENT: Normocephalic and Atraumatic, Mucous membranes pink; PERRLA; EOM intact; Fundi:  Benign;  No scleral icterus, Nares: Patent, Oropharynx: Clear,    Neck:  FROM, No Cervical Lymphadenopathy nor Thyromegaly or Carotid Bruit; No JVD; Breasts:: Not examined CHEST WALL: No tenderness CHEST: Normal respiration, clear to auscultation bilaterally HEART: Regular rate and rhythm; no murmurs rubs or gallops BACK: No kyphosis or scoliosis; No CVA tenderness ABDOMEN: Positive Bowel Sounds, Soft Non-Tender; No Masses, No Organomegaly Rectal Exam: Not done EXTREMITIES: No Cyanosis, Clubbing, or Edema; No Ulcerations. Genitalia: not examined PULSES: 2+ and symmetric SKIN: Normal  hydration no rash or ulceration CNS:   Alert and Oriented x 1,  Minimally verbal, said "Hi" when spoken to.  She responds to simple commands, and is able to move all    4 Extremitites but limited by Pain in Left Hip Vascular: pulses palpable throughout    Labs on Admission:  Basic Metabolic Panel:  Recent Labs Lab 05/21/14 2215  NA 139  K 4.5  CL 101  CO2 23  GLUCOSE 137*  BUN 27*  CREATININE 0.68  CALCIUM 9.7   Liver Function Tests: No results found for this basename: AST, ALT, ALKPHOS, BILITOT, PROT, ALBUMIN,  in the last 168 hours No results found for this basename: LIPASE, AMYLASE,  in the last 168 hours No results found for this basename: AMMONIA,  in the last 168 hours CBC:  Recent Labs Lab 05/21/14 2215  WBC 11.6*  NEUTROABS 9.8*  HGB 14.0  HCT 41.2  MCV 89.8  PLT 182   Cardiac Enzymes: No results found for this basename: CKTOTAL, CKMB, CKMBINDEX, TROPONINI,  in the last 168 hours  BNP (last 3 results) No results found for this basename: PROBNP,  in the last 8760 hours CBG: No results found for this basename: GLUCAP,  in the last 168 hours  Radiological Exams on Admission: Dg Chest 1 View  05/21/2014   CLINICAL DATA:  Unwitnessed fall.  Concern for chest injury.  EXAM: CHEST - 1 VIEW  COMPARISON:  Chest radiograph from 02/04/2013  FINDINGS: The lungs are well-aerated. Vascular congestion is noted. Peribronchial thickening is seen. Increased interstitial markings may reflect mild interstitial edema. There is no evidence of pleural effusion or pneumothorax.  The cardiomediastinal silhouette is borderline normal in size. No acute osseous abnormalities are seen.  IMPRESSION: Vascular congestion noted, with underlying peribronchial thickening. Increased interstitial markings may reflect mild interstitial edema, possibly transient in nature. No displaced rib fractures identified.   Electronically Signed   By: Roanna Raider M.D.   On: 05/21/2014 21:30   Dg Thoracic  Spine 2 View  05/21/2014   CLINICAL DATA:  Unwitnessed fall. Found on floor. Favors left hip. Nonverbal. Combative. History of stroke, dementia, MI, hypertension.  EXAM: THORACIC SPINE - 2 VIEW  COMPARISON:  02/04/2013  FINDINGS: There is a wedge compression fracture of T11, of indeterminate age. Patient is kyphotic. There is superior endplate fracture of T12, only partially imaged. No suspicious lytic or blastic lesions are identified.  IMPRESSION: Wedge compression fracture T11.  Superior endplate fracture at T12.   Electronically Signed   By: Rosalie Gums M.D.   On: 05/21/2014 21:28   Dg Lumbar Spine Complete  05/21/2014   CLINICAL DATA:  Fall.  Back pain.  EXAM: LUMBAR SPINE - COMPLETE 4+ VIEW  COMPARISON:  None.  FINDINGS: Mild levoconvex curve of the lumbar  spine on the frontal view. Lumbar vertebral body height is preserved. No fracture. Osteopenia. Aortoiliac atherosclerosis. No spondylolisthesis.  IMPRESSION: Negative.   Electronically Signed   By: Andreas Newport M.D.   On: 05/21/2014 21:28   Dg Hip Bilateral W/pelvis  05/21/2014   CLINICAL DATA:  Fall.  LEFT hip pain.  EXAM: BILATERAL HIP WITH PELVIS - 4+ VIEW  COMPARISON:  09/08/2013.  FINDINGS: There is an abnormal angle at the LEFT femoral head and neck junction. This angulation is new compared 09/08/2013. On the lateral view of the hip, of the fracture is better visualized, which appears to be a subcapital mildly impacted fracture. Atherosclerosis is noted. Probable myositis ossificans over the proximal LEFT femoral shaft. Visible obturator rings appear normal. RIGHT acetabular labrum chondrocalcinosis. No RIGHT hip fracture.  IMPRESSION: Subcapital LEFT femoral neck fracture.   Electronically Signed   By: Andreas Newport M.D.   On: 05/21/2014 21:30   Ct Head Wo Contrast  05/21/2014   CLINICAL DATA:  Fall.  Head trauma.  Neck pain.  EXAM: CT HEAD WITHOUT CONTRAST  CT CERVICAL SPINE WITHOUT CONTRAST  TECHNIQUE: Multidetector CT imaging of the  head and cervical spine was performed following the standard protocol without intravenous contrast. Multiplanar CT image reconstructions of the cervical spine were also generated.  COMPARISON:  04/16/2014.  FINDINGS: CT HEAD FINDINGS  No mass lesion, mass effect, midline shift, hydrocephalus, hemorrhage. No acute territorial cortical ischemia/infarct. Atrophy and chronic ischemic white matter disease is present. Bilateral basal ganglia infarcts are present. RIGHT cerebellar encephalomalacia is unchanged. Calvarium intact.  CT CERVICAL SPINE FINDINGS  Severe cervical spondylosis with degenerative anterolisthesis of C3 on C4 and C4 on C5. Multilevel disc osteophyte complexes. No cervical spine fracture. Ankylosis of C5-C6. The spondylolisthesis appears degenerative and facet mediated. On the coronal images, there is lucency in the anterior RIGHT lateral mass of C2. On the axial images, this represents a prominent vascular channel rather than a fracture. This is unchanged compared to prior. Mild levoconvex torticollis is similar to prior studies. Atherosclerosis. Bilateral apical scarring in the lungs. Calcification of the transverse ligament of the atlas. Vertebral artery atherosclerosis. The odontoid is intact. Occipital condyles appear intact. There is some motion artifact over the atlanto occipital junction. Severe RIGHT temporomandibular joint osteoarthritis is incidentally noted.  IMPRESSION: 1. No acute intracranial abnormality. Atrophy, chronic ischemic white matter disease and old infarcts. 2. No acute cervical spine injury.  Severe degenerative disease.   Electronically Signed   By: Andreas Newport M.D.   On: 05/21/2014 22:00   Ct Cervical Spine Wo Contrast  05/21/2014   CLINICAL DATA:  Fall.  Head trauma.  Neck pain.  EXAM: CT HEAD WITHOUT CONTRAST  CT CERVICAL SPINE WITHOUT CONTRAST  TECHNIQUE: Multidetector CT imaging of the head and cervical spine was performed following the standard protocol without  intravenous contrast. Multiplanar CT image reconstructions of the cervical spine were also generated.  COMPARISON:  04/16/2014.  FINDINGS: CT HEAD FINDINGS  No mass lesion, mass effect, midline shift, hydrocephalus, hemorrhage. No acute territorial cortical ischemia/infarct. Atrophy and chronic ischemic white matter disease is present. Bilateral basal ganglia infarcts are present. RIGHT cerebellar encephalomalacia is unchanged. Calvarium intact.  CT CERVICAL SPINE FINDINGS  Severe cervical spondylosis with degenerative anterolisthesis of C3 on C4 and C4 on C5. Multilevel disc osteophyte complexes. No cervical spine fracture. Ankylosis of C5-C6. The spondylolisthesis appears degenerative and facet mediated. On the coronal images, there is lucency in the anterior RIGHT lateral mass of C2.  On the axial images, this represents a prominent vascular channel rather than a fracture. This is unchanged compared to prior. Mild levoconvex torticollis is similar to prior studies. Atherosclerosis. Bilateral apical scarring in the lungs. Calcification of the transverse ligament of the atlas. Vertebral artery atherosclerosis. The odontoid is intact. Occipital condyles appear intact. There is some motion artifact over the atlanto occipital junction. Severe RIGHT temporomandibular joint osteoarthritis is incidentally noted.  IMPRESSION: 1. No acute intracranial abnormality. Atrophy, chronic ischemic white matter disease and old infarcts. 2. No acute cervical spine injury.  Severe degenerative disease.   Electronically Signed   By: Andreas Newport M.D.   On: 05/21/2014 22:00     EKG: Independently reviewed.    Assessment/Plan:   80 y.o. female with   Principal Problem:   1.    Closed left hip fracture   Pain Control with IV Dilaudid PRN   Bedrest   NPO after Midnight   Ortho:  Dr.  Romeo Apple  to see in AM   Active Problems:   2.   Systolic HF (heart failure)   Monitor for signs and Sxs of Fluid Overload   Monitor O2  Sats   Continue Spironolactone when taking PO     3.   Essential hypertension   PRN IV Hydralazine while NPO   Continue Metoprolol and spironolactone Rx when taking PO     4.   Prerenal azotemia   Gentle IVFs     5.   Dementia   Chronic     6.   CVA (cerebral vascular accident)   History   On Plavix RX   Holding Plavix     7.   COPD (chronic obstructive pulmonary disease)   Stable   Albuterol nebs PRN   O2 PRN     8.   Wedge compression fracture of T11 vertebra   Chronic     9.   Osteoporosis   Add Calcium and Vit D  Rx when taking PO   Consider Fosamax.        10.  CAD (coronary artery disease)   Stable, on Plavix and Metoprolol RX   Hold Plavix Rx    11.  DVT Prophylaxis    SCDs      Code Status:      DO NOT RESUSCITATE (DNR) Family Communication:    No Family present Disposition Plan:       Inpatient  Time spent:  53 Minutes  Ron Parker [facility] Pager [phone]   If 7AM -7PM Please Contact the Day Rounding Team MD for [facility]  If 7PM-7AM, Please Contact night-coverage  [email or web address] Password [email or web address] 05/21/2014, 11:12 PM

## 2014-05-21 NOTE — ED Provider Notes (Addendum)
CSN: [id number]     Arrival date & time 05/21/14  1959 History   First MD Initiated Contact with Patient 05/21/14 2013     Chief Complaint  Patient presents with  . Fall     (Consider location/radiation/quality/duration/timing/severity/associated sxs/prior Treatment) HPI Comments: Patient brought to the ER by ambulance after a fall at the nursing home. Patient had a ground-level fall prior to arrival. She is brought to the ER by ambulance. EMS reported that she seemed to be favoring her left hip, but patient is nonverbal. She will not provide any further information. Level V Caveat due to nonverbal.  Patient is a 79 y.o. female presenting with fall.  Fall    Past Medical History  Diagnosis Date  . CVA (cerebral vascular accident)   . Hypertension   . Dementia   . Osteoporosis   . MI (myocardial infarction)   . COPD (chronic obstructive pulmonary disease)   . Systolic HF (heart failure)     5/14   History reviewed. No pertinent past surgical history. Family History  Problem Relation Age of Onset  . Heart failure Sister   . Heart attack Sister    History  Substance Use Topics  . Smoking status: Never Smoker   . Smokeless tobacco: Never Used  . Alcohol Use: No   OB History   Grav Para Term Preterm Abortions TAB SAB Ect Mult Living                 Review of Systems  Unable to perform ROS: Patient nonverbal      Allergies  Review of patient's allergies indicates no known allergies.  Home Medications   Prior to Admission medications   Medication Sig Start Date End Date Taking? Authorizing Provider  clopidogrel (PLAVIX) 75 MG tablet Take 75 mg by mouth daily.     Yes Historical Provider, MD  clopidogrel (PLAVIX) 75 MG tablet Take 75 mg by mouth daily.   Yes Historical Provider, MD  Eyelid Cleansers (OCUSOFT LID SCRUB EX) Apply topically 2 (two) times daily.   Yes Historical Provider, MD  ferrous sulfate 325 (65 FE) MG tablet Take 325 mg by mouth daily with  breakfast. Crush and mix with applesauce.   Yes Historical Provider, MD  HYDROcodone-acetaminophen (NORCO/VICODIN) 5-325 MG per tablet Take 0.5 tablets by mouth 2 (two) times daily as needed (pain).    Yes Historical Provider, MD  metoprolol succinate (TOPROL-XL) 25 MG 24 hr tablet Take 25 mg by mouth daily.     Yes Historical Provider, MD  Nutritional Supplements (ENSURE PO) Take 1 Can by mouth 3 (three) times daily with meals.   Yes Historical Provider, MD  spironolactone (ALDACTONE) 25 MG tablet Take 12.5 mg by mouth daily.   Yes Historical Provider, MD  tobramycin-dexamethasone Wallene Dales) ophthalmic ointment Place 1 application into the left eye every 6 (six) hours. For 2 weeks stop 05/25/14   Yes Historical Provider, MD   BP 183/116  Pulse 117  Resp 24  Ht  (1.651 m)  Wt 130 lb (58.968 kg)  BMI 21.63 kg/m2  SpO2 97% Physical Exam  Constitutional: She appears well-developed and well-nourished. No distress.  HENT:  Head: Normocephalic and atraumatic.  Right Ear: Hearing normal.  Left Ear: Hearing normal.  Nose: Nose normal.  Mouth/Throat: Oropharynx is clear and moist and mucous membranes are normal.  Eyes: Conjunctivae and EOM are normal. Pupils are equal, round, and reactive to light.  Neck: Normal range of motion. Neck supple.  Cardiovascular:  Regular rhythm, S1 normal and S2 normal.  Exam reveals no gallop and no friction rub.   No murmur heard. Pulmonary/Chest: Effort normal and breath sounds normal. No respiratory distress. She exhibits no tenderness.  Abdominal: Soft. Normal appearance and bowel sounds are normal. There is no hepatosplenomegaly. There is no tenderness. There is no rebound, no guarding, no tenderness at McBurney's point and negative Murphy's sign. No hernia.  Musculoskeletal:       Left hip: She exhibits decreased range of motion (Patient grimaces with active or passive movement of the left hip) and tenderness.  Neurological: She is alert. She has normal  strength. No cranial nerve deficit or sensory deficit. Coordination normal.  Patient is an alert, looking around the room but will not answer any questions  Skin: Skin is warm, dry and intact. No rash noted. No cyanosis.  Psychiatric: She has a normal mood and affect. Her speech is normal and behavior is normal. Thought content normal.    ED Course  Procedures (including critical care time) Labs Review Labs Reviewed  CBC WITH DIFFERENTIAL  BASIC METABOLIC PANEL  PROTIME-INR    Imaging Review Dg Chest 1 View  05/21/2014   CLINICAL DATA:  Unwitnessed fall.  Concern for chest injury.  EXAM: CHEST - 1 VIEW  COMPARISON:  Chest radiograph from 02/04/2013  FINDINGS: The lungs are well-aerated. Vascular congestion is noted. Peribronchial thickening is seen. Increased interstitial markings may reflect mild interstitial edema. There is no evidence of pleural effusion or pneumothorax.  The cardiomediastinal silhouette is borderline normal in size. No acute osseous abnormalities are seen.  IMPRESSION: Vascular congestion noted, with underlying peribronchial thickening. Increased interstitial markings may reflect mild interstitial edema, possibly transient in nature. No displaced rib fractures identified.   Electronically Signed   By: Roanna Raider M.D.   On: 05/21/2014 21:30   Dg Thoracic Spine 2 View  05/21/2014   CLINICAL DATA:  Unwitnessed fall. Found on floor. Favors left hip. Nonverbal. Combative. History of stroke, dementia, MI, hypertension.  EXAM: THORACIC SPINE - 2 VIEW  COMPARISON:  02/04/2013  FINDINGS: There is a wedge compression fracture of T11, of indeterminate age. Patient is kyphotic. There is superior endplate fracture of T12, only partially imaged. No suspicious lytic or blastic lesions are identified.  IMPRESSION: Wedge compression fracture T11.  Superior endplate fracture at T12.   Electronically Signed   By: Rosalie Gums M.D.   On: 05/21/2014 21:28   Dg Lumbar Spine  Complete  05/21/2014   CLINICAL DATA:  Fall.  Back pain.  EXAM: LUMBAR SPINE - COMPLETE 4+ VIEW  COMPARISON:  None.  FINDINGS: Mild levoconvex curve of the lumbar spine on the frontal view. Lumbar vertebral body height is preserved. No fracture. Osteopenia. Aortoiliac atherosclerosis. No spondylolisthesis.  IMPRESSION: Negative.   Electronically Signed   By: Andreas Newport M.D.   On: 05/21/2014 21:28   Dg Hip Bilateral W/pelvis  05/21/2014   CLINICAL DATA:  Fall.  LEFT hip pain.  EXAM: BILATERAL HIP WITH PELVIS - 4+ VIEW  COMPARISON:  09/08/2013.  FINDINGS: There is an abnormal angle at the LEFT femoral head and neck junction. This angulation is new compared 09/08/2013. On the lateral view of the hip, of the fracture is better visualized, which appears to be a subcapital mildly impacted fracture. Atherosclerosis is noted. Probable myositis ossificans over the proximal LEFT femoral shaft. Visible obturator rings appear normal. RIGHT acetabular labrum chondrocalcinosis. No RIGHT hip fracture.  IMPRESSION: Subcapital LEFT femoral neck fracture.  Electronically Signed   By: Andreas Newport M.D.   On: 05/21/2014 21:30   Ct Head Wo Contrast  05/21/2014   CLINICAL DATA:  Fall.  Head trauma.  Neck pain.  EXAM: CT HEAD WITHOUT CONTRAST  CT CERVICAL SPINE WITHOUT CONTRAST  TECHNIQUE: Multidetector CT imaging of the head and cervical spine was performed following the standard protocol without intravenous contrast. Multiplanar CT image reconstructions of the cervical spine were also generated.  COMPARISON:  04/16/2014.  FINDINGS: CT HEAD FINDINGS  No mass lesion, mass effect, midline shift, hydrocephalus, hemorrhage. No acute territorial cortical ischemia/infarct. Atrophy and chronic ischemic white matter disease is present. Bilateral basal ganglia infarcts are present. RIGHT cerebellar encephalomalacia is unchanged. Calvarium intact.  CT CERVICAL SPINE FINDINGS  Severe cervical spondylosis with degenerative  anterolisthesis of C3 on C4 and C4 on C5. Multilevel disc osteophyte complexes. No cervical spine fracture. Ankylosis of C5-C6. The spondylolisthesis appears degenerative and facet mediated. On the coronal images, there is lucency in the anterior RIGHT lateral mass of C2. On the axial images, this represents a prominent vascular channel rather than a fracture. This is unchanged compared to prior. Mild levoconvex torticollis is similar to prior studies. Atherosclerosis. Bilateral apical scarring in the lungs. Calcification of the transverse ligament of the atlas. Vertebral artery atherosclerosis. The odontoid is intact. Occipital condyles appear intact. There is some motion artifact over the atlanto occipital junction. Severe RIGHT temporomandibular joint osteoarthritis is incidentally noted.  IMPRESSION: 1. No acute intracranial abnormality. Atrophy, chronic ischemic white matter disease and old infarcts. 2. No acute cervical spine injury.  Severe degenerative disease.   Electronically Signed   By: Andreas Newport M.D.   On: 05/21/2014 22:00   Ct Cervical Spine Wo Contrast  05/21/2014   CLINICAL DATA:  Fall.  Head trauma.  Neck pain.  EXAM: CT HEAD WITHOUT CONTRAST  CT CERVICAL SPINE WITHOUT CONTRAST  TECHNIQUE: Multidetector CT imaging of the head and cervical spine was performed following the standard protocol without intravenous contrast. Multiplanar CT image reconstructions of the cervical spine were also generated.  COMPARISON:  04/16/2014.  FINDINGS: CT HEAD FINDINGS  No mass lesion, mass effect, midline shift, hydrocephalus, hemorrhage. No acute territorial cortical ischemia/infarct. Atrophy and chronic ischemic white matter disease is present. Bilateral basal ganglia infarcts are present. RIGHT cerebellar encephalomalacia is unchanged. Calvarium intact.  CT CERVICAL SPINE FINDINGS  Severe cervical spondylosis with degenerative anterolisthesis of C3 on C4 and C4 on C5. Multilevel disc osteophyte complexes.  No cervical spine fracture. Ankylosis of C5-C6. The spondylolisthesis appears degenerative and facet mediated. On the coronal images, there is lucency in the anterior RIGHT lateral mass of C2. On the axial images, this represents a prominent vascular channel rather than a fracture. This is unchanged compared to prior. Mild levoconvex torticollis is similar to prior studies. Atherosclerosis. Bilateral apical scarring in the lungs. Calcification of the transverse ligament of the atlas. Vertebral artery atherosclerosis. The odontoid is intact. Occipital condyles appear intact. There is some motion artifact over the atlanto occipital junction. Severe RIGHT temporomandibular joint osteoarthritis is incidentally noted.  IMPRESSION: 1. No acute intracranial abnormality. Atrophy, chronic ischemic white matter disease and old infarcts. 2. No acute cervical spine injury.  Severe degenerative disease.   Electronically Signed   By: Andreas Newport M.D.   On: 05/21/2014 22:00     EKG Interpretation   Date/Time:  Sunday May 21 2014 22:22:43 EDT Ventricular Rate:  106 PR Interval:  187 QRS Duration: 135 QT Interval:  360  QTC Calculation: 478 R Axis:   -34 Text Interpretation:  Sinus tachycardia Left bundle branch block No  significant change since last tracing Confirmed by Rahmir Beever  MD,  Adjoa Althouse [phone]) on 05/21/2014 10:25:23 PM      MDM   Final diagnoses:  None   subcapital left hip fracture  Patient presents to ER for evaluation after a fall. Patient was found on the ground by EMS, she did be favoring her left hip. Examination did reveal that the patient grimaces with movement of the hip, but will not answer questions. She is awake and alert, however, no altered story home. She apparently chooses not to answer questions frequently. She does have a history of dementia. CT head and cervical spine were performed. X-ray of thoracic and lumbar spine in addition to hips performed. Patient has wedge  compression fracture of T11 and possibly endplate fracture of T12. Chronicity is unclear at this time, no comparisons available. X-ray of the hip does reveal a subcapital femoral neck fracture.  Case discussed with Doctor Romeo Apple on-call for orthopedics. He is aware of the patient, will consult tomorrow. There is nothing to be done tonight.    Gilda Crease, MD 05/21/14 2147  Gilda Crease, MD 05/21/14 2225

## 2014-05-22 ENCOUNTER — Inpatient Hospital Stay (HOSPITAL_COMMUNITY): Payer: Medicare PPO

## 2014-05-22 ENCOUNTER — Encounter (HOSPITAL_COMMUNITY): Payer: Self-pay | Admitting: Orthopedic Surgery

## 2014-05-22 LAB — BASIC METABOLIC PANEL
Anion gap: 12 (ref 5–15)
BUN: 24 mg/dL — ABNORMAL HIGH (ref 6–23)
CHLORIDE: 104 meq/L (ref 96–112)
CO2: 25 meq/L (ref 19–32)
Calcium: 9.4 mg/dL (ref 8.4–10.5)
Creatinine, Ser: 0.65 mg/dL (ref 0.50–1.10)
GFR calc Af Amer: 87 mL/min — ABNORMAL LOW (ref >90)
GFR calc non Af Amer: 75 mL/min — ABNORMAL LOW (ref >90)
GLUCOSE: 109 mg/dL — AB (ref 70–99)
POTASSIUM: 5.3 meq/L (ref 3.7–5.3)
SODIUM: 141 meq/L (ref 137–147)

## 2014-05-22 LAB — CBC
HCT: 41.6 % (ref 36.0–46.0)
HEMOGLOBIN: 13.8 g/dL (ref 12.0–15.0)
MCH: 30.1 pg (ref 26.0–34.0)
MCHC: 33.2 g/dL (ref 30.0–36.0)
MCV: 90.8 fL (ref 78.0–100.0)
Platelets: 166 10*3/uL (ref 150–400)
RBC: 4.58 MIL/uL (ref 3.87–5.11)
RDW: 14.5 % (ref 11.5–15.5)
WBC: 10.3 10*3/uL (ref 4.0–10.5)

## 2014-05-22 LAB — SURGICAL PCR SCREEN
MRSA, PCR: NEGATIVE
STAPHYLOCOCCUS AUREUS: NEGATIVE

## 2014-05-22 NOTE — Progress Notes (Signed)
Subjective: The patient is fairly comfortable this morning. She is admitted following fall and has closed left femoral neck fracture.  Objective: Vital signs in last 24 hours: Temp:  [98.7 F (37.1 C)] 98.7 F (37.1 C) (09/13 2331) Pulse Rate:  [87-117] 87 (09/14 0003) Resp:  [15-24] 20 (09/14 0003) BP: (119-183)/(52-116) 124/52 mmHg (09/13 2331) SpO2:  [93 %-100 %] 94 % (09/14 0003) FiO2 (%):  [1 %] 1 % (09/13 2331) Weight:  [58.968 kg (130 lb)] 58.968 kg (130 lb) (09/13 2026) Weight change:     Intake/Output from previous day:   Intake/Output this shift:    Physical Exam: General appearance patient is alert and oriented  HEENT negative  Heart regular rhythm no murmurs  Lungs clear to P&A  Abdomen no palpable organs or masses  Extremities tenderness over left hip   Recent Labs  05/21/14 2215  WBC 11.6*  HGB 14.0  HCT 41.2  PLT 182   BMET  Recent Labs  05/21/14 2215  NA 139  K 4.5  CL 101  CO2 23  GLUCOSE 137*  BUN 27*  CREATININE 0.68  CALCIUM 9.7    Studies/Results: Dg Chest 1 View  05/21/2014   CLINICAL DATA:  Unwitnessed fall.  Concern for chest injury.  EXAM: CHEST - 1 VIEW  COMPARISON:  Chest radiograph from 02/04/2013  FINDINGS: The lungs are well-aerated. Vascular congestion is noted. Peribronchial thickening is seen. Increased interstitial markings may reflect mild interstitial edema. There is no evidence of pleural effusion or pneumothorax.  The cardiomediastinal silhouette is borderline normal in size. No acute osseous abnormalities are seen.  IMPRESSION: Vascular congestion noted, with underlying peribronchial thickening. Increased interstitial markings may reflect mild interstitial edema, possibly transient in nature. No displaced rib fractures identified.   Electronically Signed   By: Roanna Raider M.D.   On: 05/21/2014 21:30   Dg Thoracic Spine 2 View  05/21/2014   CLINICAL DATA:  Unwitnessed fall. Found on floor. Favors left hip.  Nonverbal. Combative. History of stroke, dementia, MI, hypertension.  EXAM: THORACIC SPINE - 2 VIEW  COMPARISON:  02/04/2013  FINDINGS: There is a wedge compression fracture of T11, of indeterminate age. Patient is kyphotic. There is superior endplate fracture of T12, only partially imaged. No suspicious lytic or blastic lesions are identified.  IMPRESSION: Wedge compression fracture T11.  Superior endplate fracture at T12.   Electronically Signed   By: Rosalie Gums M.D.   On: 05/21/2014 21:28   Dg Lumbar Spine Complete  05/21/2014   CLINICAL DATA:  Fall.  Back pain.  EXAM: LUMBAR SPINE - COMPLETE 4+ VIEW  COMPARISON:  None.  FINDINGS: Mild levoconvex curve of the lumbar spine on the frontal view. Lumbar vertebral body height is preserved. No fracture. Osteopenia. Aortoiliac atherosclerosis. No spondylolisthesis.  IMPRESSION: Negative.   Electronically Signed   By: Andreas Newport M.D.   On: 05/21/2014 21:28   Dg Hip Bilateral W/pelvis  05/21/2014   CLINICAL DATA:  Fall.  LEFT hip pain.  EXAM: BILATERAL HIP WITH PELVIS - 4+ VIEW  COMPARISON:  09/08/2013.  FINDINGS: There is an abnormal angle at the LEFT femoral head and neck junction. This angulation is new compared 09/08/2013. On the lateral view of the hip, of the fracture is better visualized, which appears to be a subcapital mildly impacted fracture. Atherosclerosis is noted. Probable myositis ossificans over the proximal LEFT femoral shaft. Visible obturator rings appear normal. RIGHT acetabular labrum chondrocalcinosis. No RIGHT hip fracture.  IMPRESSION: Subcapital LEFT femoral  neck fracture.   Electronically Signed   By: Andreas Newport M.D.   On: 05/21/2014 21:30   Ct Head Wo Contrast  05/21/2014   CLINICAL DATA:  Fall.  Head trauma.  Neck pain.  EXAM: CT HEAD WITHOUT CONTRAST  CT CERVICAL SPINE WITHOUT CONTRAST  TECHNIQUE: Multidetector CT imaging of the head and cervical spine was performed following the standard protocol without intravenous  contrast. Multiplanar CT image reconstructions of the cervical spine were also generated.  COMPARISON:  04/16/2014.  FINDINGS: CT HEAD FINDINGS  No mass lesion, mass effect, midline shift, hydrocephalus, hemorrhage. No acute territorial cortical ischemia/infarct. Atrophy and chronic ischemic white matter disease is present. Bilateral basal ganglia infarcts are present. RIGHT cerebellar encephalomalacia is unchanged. Calvarium intact.  CT CERVICAL SPINE FINDINGS  Severe cervical spondylosis with degenerative anterolisthesis of C3 on C4 and C4 on C5. Multilevel disc osteophyte complexes. No cervical spine fracture. Ankylosis of C5-C6. The spondylolisthesis appears degenerative and facet mediated. On the coronal images, there is lucency in the anterior RIGHT lateral mass of C2. On the axial images, this represents a prominent vascular channel rather than a fracture. This is unchanged compared to prior. Mild levoconvex torticollis is similar to prior studies. Atherosclerosis. Bilateral apical scarring in the lungs. Calcification of the transverse ligament of the atlas. Vertebral artery atherosclerosis. The odontoid is intact. Occipital condyles appear intact. There is some motion artifact over the atlanto occipital junction. Severe RIGHT temporomandibular joint osteoarthritis is incidentally noted.  IMPRESSION: 1. No acute intracranial abnormality. Atrophy, chronic ischemic white matter disease and old infarcts. 2. No acute cervical spine injury.  Severe degenerative disease.   Electronically Signed   By: Andreas Newport M.D.   On: 05/21/2014 22:00   Ct Cervical Spine Wo Contrast  05/21/2014   CLINICAL DATA:  Fall.  Head trauma.  Neck pain.  EXAM: CT HEAD WITHOUT CONTRAST  CT CERVICAL SPINE WITHOUT CONTRAST  TECHNIQUE: Multidetector CT imaging of the head and cervical spine was performed following the standard protocol without intravenous contrast. Multiplanar CT image reconstructions of the cervical spine were also  generated.  COMPARISON:  04/16/2014.  FINDINGS: CT HEAD FINDINGS  No mass lesion, mass effect, midline shift, hydrocephalus, hemorrhage. No acute territorial cortical ischemia/infarct. Atrophy and chronic ischemic white matter disease is present. Bilateral basal ganglia infarcts are present. RIGHT cerebellar encephalomalacia is unchanged. Calvarium intact.  CT CERVICAL SPINE FINDINGS  Severe cervical spondylosis with degenerative anterolisthesis of C3 on C4 and C4 on C5. Multilevel disc osteophyte complexes. No cervical spine fracture. Ankylosis of C5-C6. The spondylolisthesis appears degenerative and facet mediated. On the coronal images, there is lucency in the anterior RIGHT lateral mass of C2. On the axial images, this represents a prominent vascular channel rather than a fracture. This is unchanged compared to prior. Mild levoconvex torticollis is similar to prior studies. Atherosclerosis. Bilateral apical scarring in the lungs. Calcification of the transverse ligament of the atlas. Vertebral artery atherosclerosis. The odontoid is intact. Occipital condyles appear intact. There is some motion artifact over the atlanto occipital junction. Severe RIGHT temporomandibular joint osteoarthritis is incidentally noted.  IMPRESSION: 1. No acute intracranial abnormality. Atrophy, chronic ischemic white matter disease and old infarcts. 2. No acute cervical spine injury.  Severe degenerative disease.   Electronically Signed   By: Andreas Newport M.D.   On: 05/21/2014 22:00    Medications:  . feeding supplement (ENSURE COMPLETE)  237 mL Oral TID BM  . ferrous sulfate  325 mg Oral Q breakfast  .  metoprolol succinate  25 mg Oral Daily  . neomycin-bacitracin-polymyxin      . spironolactone  12.5 mg Oral Daily  . tobramycin-dexamethasone  1 application Left Eye 4 times per day    . sodium chloride 50 mL/hr at 05/22/14 0121     Assessment/Plan: 1 left femoral neck fracture-continue pain control the patient will  be seen by Dr. Romeo Apple of orthopedics today 2. Systolic congestive heart failure-to continue diuresis with spironolactone-condition stable with mild edema on chest x-ray 3. Essential hypertension-plan to continue current meds 4. Dementia Alzheimer type-continue current meds    LOS: 1 day   Florena Kozma G 05/22/2014, 6:14 AM

## 2014-05-22 NOTE — Progress Notes (Signed)
Patient ID: [name], female   DOB: [birth year], 80 y.o.   MRN: [id number]  Evaluation of this patient is that the x-rays are not definitive for acute fracture recommend MRI  i have spoken to the power of attorney who is her son and he agrees that we should get more info so MRI is ordered to delineate acuteness of this fracture of the left hip

## 2014-05-22 NOTE — Consult Note (Signed)
Reason for Consult: Left hip fracture Referring Physician: Dr. Sherian Maroon [name] is an 80 y.o. female.  HPI: 80 year old female had assisted living facility found on the floor. Brought to the hospital found to have fracture on the left question acuteness.  Past Medical History  Diagnosis Date  . CVA (cerebral vascular accident)   . Hypertension   . Dementia   . Osteoporosis   . MI (myocardial infarction)   . COPD (chronic obstructive pulmonary disease)   . Systolic HF (heart failure)     5/14    History reviewed. No pertinent past surgical history.  Family History  Problem Relation Age of Onset  . Heart failure Sister   . Heart attack Sister     Social History:  reports that she has never smoked. She has never used smokeless tobacco. She reports that she does not drink alcohol or use illicit drugs.  Allergies: No Known Allergies  Medications: I have reviewed the patient's current medications.  Results for orders placed during the hospital encounter of 05/21/14 (from the past 48 hour(s))  CBC WITH DIFFERENTIAL     Status: Abnormal   Collection Time    05/21/14 10:15 PM      Result Value Ref Range   WBC 11.6 (*) 4.0 - 10.5 K/uL   RBC 4.59  3.87 - 5.11 MIL/uL   Hemoglobin 14.0  12.0 - 15.0 g/dL   HCT 41.2  36.0 - 46.0 %   MCV 89.8  78.0 - 100.0 fL   MCH 30.5  26.0 - 34.0 pg   MCHC 34.0  30.0 - 36.0 g/dL   RDW 14.4  11.5 - 15.5 %   Platelets 182  150 - 400 K/uL   Neutrophils Relative % 85 (*) 43 - 77 %   Neutro Abs 9.8 (*) 1.7 - 7.7 K/uL   Lymphocytes Relative 9 (*) 12 - 46 %   Lymphs Abs 1.0  0.7 - 4.0 K/uL   Monocytes Relative 5  3 - 12 %   Monocytes Absolute 0.6  0.1 - 1.0 K/uL   Eosinophils Relative 1  0 - 5 %   Eosinophils Absolute 0.1  0.0 - 0.7 K/uL   Basophils Relative 0  0 - 1 %   Basophils Absolute 0.0  0.0 - 0.1 K/uL  BASIC METABOLIC PANEL     Status: Abnormal   Collection Time    05/21/14 10:15 PM      Result Value Ref Range   Sodium 139   137 - 147 mEq/L   Potassium 4.5  3.7 - 5.3 mEq/L   Chloride 101  96 - 112 mEq/L   CO2 23  19 - 32 mEq/L   Glucose, Bld 137 (*) 70 - 99 mg/dL   BUN 27 (*) 6 - 23 mg/dL   Creatinine, Ser 0.68  0.50 - 1.10 mg/dL   Calcium 9.7  8.4 - 10.5 mg/dL   GFR calc non Af Amer 74 (*) >90 mL/min   GFR calc Af Amer 85 (*) >90 mL/min   Comment: (NOTE)     The eGFR has been calculated using the CKD EPI equation.     This calculation has not been validated in all clinical situations.     eGFR's persistently <90 mL/min signify possible Chronic Kidney     Disease.   Anion gap 15  5 - 15  PROTIME-INR     Status: None   Collection Time    05/21/14 10:15 PM  Result Value Ref Range   Prothrombin Time 13.7  11.6 - 15.2 seconds   INR 1.05  0.00 - 1.49  SURGICAL PCR SCREEN     Status: None   Collection Time    05/21/14 11:59 PM      Result Value Ref Range   MRSA, PCR NEGATIVE  NEGATIVE   Staphylococcus aureus NEGATIVE  NEGATIVE   Comment:            The Xpert SA Assay (FDA     approved for NASAL specimens     in patients over 17 years of age),     is one component of     a comprehensive surveillance     program.  Test performance has     been validated by [employer] for patients greater     than or equal to 29 year old.     It is not intended     to diagnose infection nor to     guide or monitor treatment.  BASIC METABOLIC PANEL     Status: Abnormal   Collection Time    05/22/14  6:13 AM      Result Value Ref Range   Sodium 141  137 - 147 mEq/L   Potassium 5.3  3.7 - 5.3 mEq/L   Chloride 104  96 - 112 mEq/L   CO2 25  19 - 32 mEq/L   Glucose, Bld 109 (*) 70 - 99 mg/dL   BUN 24 (*) 6 - 23 mg/dL   Creatinine, Ser 0.65  0.50 - 1.10 mg/dL   Calcium 9.4  8.4 - 10.5 mg/dL   GFR calc non Af Amer 75 (*) >90 mL/min   GFR calc Af Amer 87 (*) >90 mL/min   Comment: (NOTE)     The eGFR has been calculated using the CKD EPI equation.     This calculation has not been validated in all clinical  situations.     eGFR's persistently <90 mL/min signify possible Chronic Kidney     Disease.   Anion gap 12  5 - 15  CBC     Status: None   Collection Time    05/22/14  6:13 AM      Result Value Ref Range   WBC 10.3  4.0 - 10.5 K/uL   RBC 4.58  3.87 - 5.11 MIL/uL   Hemoglobin 13.8  12.0 - 15.0 g/dL   HCT 41.6  36.0 - 46.0 %   MCV 90.8  78.0 - 100.0 fL   MCH 30.1  26.0 - 34.0 pg   MCHC 33.2  30.0 - 36.0 g/dL   RDW 14.5  11.5 - 15.5 %   Platelets 166  150 - 400 K/uL    Dg Chest 1 View  05/21/2014   CLINICAL DATA:  Unwitnessed fall.  Concern for chest injury.  EXAM: CHEST - 1 VIEW  COMPARISON:  Chest radiograph from 02/04/2013  FINDINGS: The lungs are well-aerated. Vascular congestion is noted. Peribronchial thickening is seen. Increased interstitial markings may reflect mild interstitial edema. There is no evidence of pleural effusion or pneumothorax.  The cardiomediastinal silhouette is borderline normal in size. No acute osseous abnormalities are seen.  IMPRESSION: Vascular congestion noted, with underlying peribronchial thickening. Increased interstitial markings may reflect mild interstitial edema, possibly transient in nature. No displaced rib fractures identified.   Electronically Signed   By: Garald Balding M.D.   On: 05/21/2014 21:30   Dg Thoracic Spine 2  View  05/21/2014   CLINICAL DATA:  Unwitnessed fall. Found on floor. Favors left hip. Nonverbal. Combative. History of stroke, dementia, MI, hypertension.  EXAM: THORACIC SPINE - 2 VIEW  COMPARISON:  02/04/2013  FINDINGS: There is a wedge compression fracture of T11, of indeterminate age. Patient is kyphotic. There is superior endplate fracture of X44, only partially imaged. No suspicious lytic or blastic lesions are identified.  IMPRESSION: Wedge compression fracture T11.  Superior endplate fracture at Y18.   Electronically Signed   By: Shon Hale M.D.   On: 05/21/2014 21:28   Dg Lumbar Spine Complete  05/21/2014   CLINICAL DATA:   Fall.  Back pain.  EXAM: LUMBAR SPINE - COMPLETE 4+ VIEW  COMPARISON:  None.  FINDINGS: Mild levoconvex curve of the lumbar spine on the frontal view. Lumbar vertebral body height is preserved. No fracture. Osteopenia. Aortoiliac atherosclerosis. No spondylolisthesis.  IMPRESSION: Negative.   Electronically Signed   By: Dereck Ligas M.D.   On: 05/21/2014 21:28   Dg Hip Bilateral W/pelvis  05/21/2014   CLINICAL DATA:  Fall.  LEFT hip pain.  EXAM: BILATERAL HIP WITH PELVIS - 4+ VIEW  COMPARISON:  09/08/2013.  FINDINGS: There is an abnormal angle at the LEFT femoral head and neck junction. This angulation is new compared 09/08/2013. On the lateral view of the hip, of the fracture is better visualized, which appears to be a subcapital mildly impacted fracture. Atherosclerosis is noted. Probable myositis ossificans over the proximal LEFT femoral shaft. Visible obturator rings appear normal. RIGHT acetabular labrum chondrocalcinosis. No RIGHT hip fracture.  IMPRESSION: Subcapital LEFT femoral neck fracture.   Electronically Signed   By: Dereck Ligas M.D.   On: 05/21/2014 21:30   Ct Head Wo Contrast  05/21/2014   CLINICAL DATA:  Fall.  Head trauma.  Neck pain.  EXAM: CT HEAD WITHOUT CONTRAST  CT CERVICAL SPINE WITHOUT CONTRAST  TECHNIQUE: Multidetector CT imaging of the head and cervical spine was performed following the standard protocol without intravenous contrast. Multiplanar CT image reconstructions of the cervical spine were also generated.  COMPARISON:  04/16/2014.  FINDINGS: CT HEAD FINDINGS  No mass lesion, mass effect, midline shift, hydrocephalus, hemorrhage. No acute territorial cortical ischemia/infarct. Atrophy and chronic ischemic white matter disease is present. Bilateral basal ganglia infarcts are present. RIGHT cerebellar encephalomalacia is unchanged. Calvarium intact.  CT CERVICAL SPINE FINDINGS  Severe cervical spondylosis with degenerative anterolisthesis of C3 on C4 and C4 on C5.  Multilevel disc osteophyte complexes. No cervical spine fracture. Ankylosis of C5-C6. The spondylolisthesis appears degenerative and facet mediated. On the coronal images, there is lucency in the anterior RIGHT lateral mass of C2. On the axial images, this represents a prominent vascular channel rather than a fracture. This is unchanged compared to prior. Mild levoconvex torticollis is similar to prior studies. Atherosclerosis. Bilateral apical scarring in the lungs. Calcification of the transverse ligament of the atlas. Vertebral artery atherosclerosis. The odontoid is intact. Occipital condyles appear intact. There is some motion artifact over the atlanto occipital junction. Severe RIGHT temporomandibular joint osteoarthritis is incidentally noted.  IMPRESSION: 1. No acute intracranial abnormality. Atrophy, chronic ischemic white matter disease and old infarcts. 2. No acute cervical spine injury.  Severe degenerative disease.   Electronically Signed   By: Dereck Ligas M.D.   On: 05/21/2014 22:00   Ct Cervical Spine Wo Contrast  05/21/2014   CLINICAL DATA:  Fall.  Head trauma.  Neck pain.  EXAM: CT HEAD WITHOUT CONTRAST  CT  CERVICAL SPINE WITHOUT CONTRAST  TECHNIQUE: Multidetector CT imaging of the head and cervical spine was performed following the standard protocol without intravenous contrast. Multiplanar CT image reconstructions of the cervical spine were also generated.  COMPARISON:  04/16/2014.  FINDINGS: CT HEAD FINDINGS  No mass lesion, mass effect, midline shift, hydrocephalus, hemorrhage. No acute territorial cortical ischemia/infarct. Atrophy and chronic ischemic white matter disease is present. Bilateral basal ganglia infarcts are present. RIGHT cerebellar encephalomalacia is unchanged. Calvarium intact.  CT CERVICAL SPINE FINDINGS  Severe cervical spondylosis with degenerative anterolisthesis of C3 on C4 and C4 on C5. Multilevel disc osteophyte complexes. No cervical spine fracture. Ankylosis of  C5-C6. The spondylolisthesis appears degenerative and facet mediated. On the coronal images, there is lucency in the anterior RIGHT lateral mass of C2. On the axial images, this represents a prominent vascular channel rather than a fracture. This is unchanged compared to prior. Mild levoconvex torticollis is similar to prior studies. Atherosclerosis. Bilateral apical scarring in the lungs. Calcification of the transverse ligament of the atlas. Vertebral artery atherosclerosis. The odontoid is intact. Occipital condyles appear intact. There is some motion artifact over the atlanto occipital junction. Severe RIGHT temporomandibular joint osteoarthritis is incidentally noted.  IMPRESSION: 1. No acute intracranial abnormality. Atrophy, chronic ischemic white matter disease and old infarcts. 2. No acute cervical spine injury.  Severe degenerative disease.   Electronically Signed   By: Dereck Ligas M.D.   On: 05/21/2014 22:00    ROS Blood pressure 152/58, pulse 99, temperature 98.5 F (36.9 C), temperature source Oral, resp. rate 20, height 5' 5" (1.651 m), weight 130 lb (58.968 kg), SpO2 99.00%. Physical Exam This patient is aphasic She can respond to painful stimuli though. She is found lying in bed both legs flexed up with chronic hip flexion and knee flexion contractures lying on the left side. I cannot elicit pain to palpation of the left hip or groin or thigh and there is no bruise found there. She does respond with painful facial expression when I flex and extend the right and left hip. There is no tenderness in the left hip over the greater trochanter thigh or groin. Tibial and foot alignment is normal bilaterally. Her upper extremities do not show malalignment, mild contracture noted. Mild degenerative changes noted in the hands. No subluxation atrophy or tremor is found.   Assessment/Plan: It would be advantageous to get further imaging as the x-rays are not clear on the hip fracture on the left  side. She's fallen several times according to her son falling out of the bed so it's unclear when this fracture occurred. It's been one year since she actually stood or walked successfully on her legs.  After speaking with him, I think surgical intervention is not going to be needed. However, it would be prudent to obtain further imaging if possible (may not be possible due to her positioning) to determine the acuteness of the fracture    Arther Abbott 05/22/2014, 2:32 PM

## 2014-05-22 NOTE — Plan of Care (Addendum)
Discussed with Dr. Elodia Florence care for pt and that family (POA) would like opportunity to speak with him.  Family informed that Dr. Eulah Citizen try to round about btwn 1400-1500.  Otherwise they could hopefully connect by phone.  Even though Dr. Has not seen pt (new consult) he did indicate that pt could eat, since on Plavix and OR could not be done till 09/15 (at the earliest).  Diet orders placed and family informed of info. * Pt has to ggo to wk and will be unable to be here btwn 2-3. Will return to hospital about 1630.   Dr. Romeo Apple - please call son at [phone] when you get a chance.

## 2014-05-23 NOTE — Clinical Social Work Placement (Signed)
Clinical Social Work Department CLINICAL SOCIAL WORK PLACEMENT NOTE 05/23/2014  Patient:  [name], [name]  Account Number:  [id number] Admit date:  05/21/2014  Clinical Social Worker:  Derenda Fennel, LCSW  Date/time:  05/23/2014 02:37 PM  Clinical Social Work is seeking post-discharge placement for this patient at the following level of care:   SKILLED NURSING   (*CSW will update this form in Epic as items are completed)   05/23/2014  Patient/family provided with [facility] Department of Clinical Social Work's list of facilities offering this level of care within the geographic area requested by the patient (or if unable, by the patient's family).  05/23/2014  Patient/family informed of their freedom to choose among providers that offer the needed level of care, that participate in Medicare, Medicaid or managed care program needed by the patient, have an available bed and are willing to accept the patient.  05/23/2014  Patient/family informed of [facility]' ownership interest in [facility], as well as of the fact that they are under no obligation to receive care at this facility.  PASARR submitted to EDS on  PASARR number received on   FL2 transmitted to all facilities in geographic area requested by pt/family on  05/23/2014 FL2 transmitted to all facilities within larger geographic area on   Patient informed that his/her managed care company has contracts with or will negotiate with  certain facilities, including the following:     Patient/family informed of bed offers received:   Patient chooses bed at  Physician recommends and patient chooses bed at    Patient to be transferred to  on   Patient to be transferred to facility by  Patient and family notified of transfer on  Name of family member notified:    The following physician request were entered in Epic:   Additional Comments: Pt has existing pasarr.  Derenda Fennel, [place] [phone]

## 2014-05-23 NOTE — Progress Notes (Signed)
Subjective: The patient remains fairly comfortable. She was admitted following closed left femoral neck fracture. She was seen by orthopedist and the acuteness of the fractures and question.  Objective: Vital signs in last 24 hours: Temp:  [98.2 F (36.8 C)-98.5 F (36.9 C)] 98.5 F (36.9 C) (09/15 0523) Pulse Rate:  [77-99] 82 (09/15 0523) Resp:  [18-20] 18 (09/15 0523) BP: (120-152)/(58-113) 136/68 mmHg (09/15 0523) SpO2:  [92 %-99 %] 92 % (09/14 1934) Weight change:  Last BM Date: 05/21/14  Intake/Output from previous day: 09/14 0701 - 09/15 0700 In: 360 [P.O.:360] Out: 1 [Urine:1] Intake/Output this shift:    Physical Exam: General appearance the patient is alert and oriented  HEENT negative  Heart regular rhythm no murmurs  Lungs clear to P&A  Abdomen no palpable organs or masses  Extremities tenderness over left hip   Recent Labs  05/21/14 2215 05/22/14 0613  WBC 11.6* 10.3  HGB 14.0 13.8  HCT 41.2 41.6  PLT 182 166   BMET  Recent Labs  05/21/14 2215 05/22/14 0613  NA 139 141  K 4.5 5.3  CL 101 104  CO2 23 25  GLUCOSE 137* 109*  BUN 27* 24*  CREATININE 0.68 0.65  CALCIUM 9.7 9.4    Studies/Results: Dg Chest 1 View  05/21/2014   CLINICAL DATA:  Unwitnessed fall.  Concern for chest injury.  EXAM: CHEST - 1 VIEW  COMPARISON:  Chest radiograph from 02/04/2013  FINDINGS: The lungs are well-aerated. Vascular congestion is noted. Peribronchial thickening is seen. Increased interstitial markings may reflect mild interstitial edema. There is no evidence of pleural effusion or pneumothorax.  The cardiomediastinal silhouette is borderline normal in size. No acute osseous abnormalities are seen.  IMPRESSION: Vascular congestion noted, with underlying peribronchial thickening. Increased interstitial markings may reflect mild interstitial edema, possibly transient in nature. No displaced rib fractures identified.   Electronically Signed   By: Roanna Raider  M.D.   On: 05/21/2014 21:30   Dg Thoracic Spine 2 View  05/21/2014   CLINICAL DATA:  Unwitnessed fall. Found on floor. Favors left hip. Nonverbal. Combative. History of stroke, dementia, MI, hypertension.  EXAM: THORACIC SPINE - 2 VIEW  COMPARISON:  02/04/2013  FINDINGS: There is a wedge compression fracture of T11, of indeterminate age. Patient is kyphotic. There is superior endplate fracture of T12, only partially imaged. No suspicious lytic or blastic lesions are identified.  IMPRESSION: Wedge compression fracture T11.  Superior endplate fracture at T12.   Electronically Signed   By: Rosalie Gums M.D.   On: 05/21/2014 21:28   Dg Lumbar Spine Complete  05/21/2014   CLINICAL DATA:  Fall.  Back pain.  EXAM: LUMBAR SPINE - COMPLETE 4+ VIEW  COMPARISON:  None.  FINDINGS: Mild levoconvex curve of the lumbar spine on the frontal view. Lumbar vertebral body height is preserved. No fracture. Osteopenia. Aortoiliac atherosclerosis. No spondylolisthesis.  IMPRESSION: Negative.   Electronically Signed   By: Andreas Newport M.D.   On: 05/21/2014 21:28   Dg Hip Bilateral W/pelvis  05/21/2014   CLINICAL DATA:  Fall.  LEFT hip pain.  EXAM: BILATERAL HIP WITH PELVIS - 4+ VIEW  COMPARISON:  09/08/2013.  FINDINGS: There is an abnormal angle at the LEFT femoral head and neck junction. This angulation is new compared 09/08/2013. On the lateral view of the hip, of the fracture is better visualized, which appears to be a subcapital mildly impacted fracture. Atherosclerosis is noted. Probable myositis ossificans over the proximal LEFT femoral shaft.  Visible obturator rings appear normal. RIGHT acetabular labrum chondrocalcinosis. No RIGHT hip fracture.  IMPRESSION: Subcapital LEFT femoral neck fracture.   Electronically Signed   By: Andreas Newport M.D.   On: 05/21/2014 21:30   Ct Head Wo Contrast  05/21/2014   CLINICAL DATA:  Fall.  Head trauma.  Neck pain.  EXAM: CT HEAD WITHOUT CONTRAST  CT CERVICAL SPINE WITHOUT CONTRAST   TECHNIQUE: Multidetector CT imaging of the head and cervical spine was performed following the standard protocol without intravenous contrast. Multiplanar CT image reconstructions of the cervical spine were also generated.  COMPARISON:  04/16/2014.  FINDINGS: CT HEAD FINDINGS  No mass lesion, mass effect, midline shift, hydrocephalus, hemorrhage. No acute territorial cortical ischemia/infarct. Atrophy and chronic ischemic white matter disease is present. Bilateral basal ganglia infarcts are present. RIGHT cerebellar encephalomalacia is unchanged. Calvarium intact.  CT CERVICAL SPINE FINDINGS  Severe cervical spondylosis with degenerative anterolisthesis of C3 on C4 and C4 on C5. Multilevel disc osteophyte complexes. No cervical spine fracture. Ankylosis of C5-C6. The spondylolisthesis appears degenerative and facet mediated. On the coronal images, there is lucency in the anterior RIGHT lateral mass of C2. On the axial images, this represents a prominent vascular channel rather than a fracture. This is unchanged compared to prior. Mild levoconvex torticollis is similar to prior studies. Atherosclerosis. Bilateral apical scarring in the lungs. Calcification of the transverse ligament of the atlas. Vertebral artery atherosclerosis. The odontoid is intact. Occipital condyles appear intact. There is some motion artifact over the atlanto occipital junction. Severe RIGHT temporomandibular joint osteoarthritis is incidentally noted.  IMPRESSION: 1. No acute intracranial abnormality. Atrophy, chronic ischemic white matter disease and old infarcts. 2. No acute cervical spine injury.  Severe degenerative disease.   Electronically Signed   By: Andreas Newport M.D.   On: 05/21/2014 22:00   Ct Cervical Spine Wo Contrast  05/21/2014   CLINICAL DATA:  Fall.  Head trauma.  Neck pain.  EXAM: CT HEAD WITHOUT CONTRAST  CT CERVICAL SPINE WITHOUT CONTRAST  TECHNIQUE: Multidetector CT imaging of the head and cervical spine was  performed following the standard protocol without intravenous contrast. Multiplanar CT image reconstructions of the cervical spine were also generated.  COMPARISON:  04/16/2014.  FINDINGS: CT HEAD FINDINGS  No mass lesion, mass effect, midline shift, hydrocephalus, hemorrhage. No acute territorial cortical ischemia/infarct. Atrophy and chronic ischemic white matter disease is present. Bilateral basal ganglia infarcts are present. RIGHT cerebellar encephalomalacia is unchanged. Calvarium intact.  CT CERVICAL SPINE FINDINGS  Severe cervical spondylosis with degenerative anterolisthesis of C3 on C4 and C4 on C5. Multilevel disc osteophyte complexes. No cervical spine fracture. Ankylosis of C5-C6. The spondylolisthesis appears degenerative and facet mediated. On the coronal images, there is lucency in the anterior RIGHT lateral mass of C2. On the axial images, this represents a prominent vascular channel rather than a fracture. This is unchanged compared to prior. Mild levoconvex torticollis is similar to prior studies. Atherosclerosis. Bilateral apical scarring in the lungs. Calcification of the transverse ligament of the atlas. Vertebral artery atherosclerosis. The odontoid is intact. Occipital condyles appear intact. There is some motion artifact over the atlanto occipital junction. Severe RIGHT temporomandibular joint osteoarthritis is incidentally noted.  IMPRESSION: 1. No acute intracranial abnormality. Atrophy, chronic ischemic white matter disease and old infarcts. 2. No acute cervical spine injury.  Severe degenerative disease.   Electronically Signed   By: Andreas Newport M.D.   On: 05/21/2014 22:00   Mr Hip Left Wo Contrast  05/22/2014  CLINICAL DATA:  Left hip pain.  Abnormal x-ray.  EXAM: MR OF THE LEFT HIP WITHOUT CONTRAST  TECHNIQUE: Multiplanar, multisequence MR imaging was performed. No intravenous contrast was administered.  COMPARISON:  Radiographs 05/21/2014.  FINDINGS: Bones: As demonstrated  radiographically, there is a moderately displaced acute subcapital fracture of the left femoral neck. The right femoral neck is intact. There is no evidence of dislocation or femoral head avascular necrosis. There appear to be bilateral hip contractures. The visualized bony pelvis appears normal. The sacroiliac joints and symphysis pubis appear normal.  Articular cartilage and labrum  Articular cartilage: No focal chondral defect or subchondral signal abnormality identified.  Labrum: There is no gross labral tear or paralabral abnormality.  Joint or bursal effusion  Joint effusion: No significant hip joint effusion.  Bursae: No focal periarticular fluid collection.  Muscles and tendons  Muscles and tendons: The gluteus, hamstring and iliopsoas tendons appear normal. The piriformis muscles appear symmetric. There is mild soft tissue hemorrhage/edema anterior to the proximal left femoral fracture.  Other findings  Miscellaneous: There are diverticular changes of the sigmoid colon. Pelvic laxity suspected status post hysterectomy.  IMPRESSION: 1. Moderately displaced subcapital fracture of the left femoral neck. 2. No evidence of dislocation or pelvic fracture. 3. Mild surrounding hemorrhage/edema without large hematoma.   Electronically Signed   By: Roxy Horseman M.D.   On: 05/22/2014 15:55    Medications:  . feeding supplement (ENSURE COMPLETE)  237 mL Oral TID BM  . ferrous sulfate  325 mg Oral Q breakfast  . metoprolol succinate  25 mg Oral Daily  . spironolactone  12.5 mg Oral Daily  . tobramycin-dexamethasone  1 application Left Eye 4 times per day    . sodium chloride 50 mL/hr at 05/22/14 0121     Assessment/Plan: 1. Moderately displaced femoral neck fracture left hip-continued pain control-patient will be seen again by Dr. Romeo Apple of orthopedics today  2 systolic congestive heart failure to continue diuresis continue spironolactone  3. Essential hypertension-plan to continue current meds  4.  Dementia Alzheimer type and continue current meds   LOS: 2 days   Tira Lafferty G 05/23/2014, 6:12 AM

## 2014-05-23 NOTE — Progress Notes (Signed)
Pt legs contracted, when trying to move pt, pt  Moaned, unable to place scds without causing discomfort to pt.  Pt cannot straighten legs.  Placed pillow between legs.  Bed in lowest position.  Friends at bedside feeding pt.

## 2014-05-23 NOTE — Care Management Utilization Note (Signed)
UR completed 

## 2014-05-23 NOTE — Progress Notes (Signed)
Patient ID: [name], female   DOB: 1919/06/02, 81 y.o.   MRN: [id number] After review of the mri this does appear to be acute fracture  However, clinical situation does not warrant surgical intervention.

## 2014-05-23 NOTE — Clinical Social Work Psychosocial (Signed)
Clinical Social Work Department BRIEF PSYCHOSOCIAL ASSESSMENT 05/23/2014  Patient:  HORTENCE, CHARTER     Account Number:  [id number]     Admit date:  05/21/2014  Clinical Social Worker:  Nancie Neas  Date/Time:  05/23/2014 02:39 PM  Referred by:  CSW  Date Referred:  05/23/2014 Referred for  SNF Placement   Other Referral:   Interview type:  Family Other interview type:   son- Charles    PSYCHOSOCIAL DATA Living Status:  FACILITY Admitted from facility:  [facility] Level of care:  Assisted Living Primary support name:  Leonette Most Primary support relationship to patient:  CHILD, ADULT Degree of support available:   supportive    CURRENT CONCERNS Current Concerns  Post-Acute Placement   Other Concerns:    SOCIAL WORK ASSESSMENT / PLAN CSW spoke with pt's son, Leonette Most who reports he is also HCPOA. Pt is aphasic. She has been a resident at [facility] for about 7 years. Leonette Most reports he lives in [place] and appears to be very involved and supportive. He indicates they have been happy at [facility]. Pt rolled out of bed and appeared to have pain in left hip. Per ortho, pt has acute fracture, but does not warrant surgical intervention. CSW discussed situation with Tammy, Charity fundraiser at [facility]. She reports pt was able to transfer with assist to wheelchair. She was able to feed herself, but required extensive assit with toileting, bathing, and dressing. Tammy said pt smiles a lot, but very rarely speaks any words. Tammy will plan to assess pt tomorrow, but is worried that they will be unable to meet her needs now. CSW discussed this with Leonette Most. His preference is for pt to return to [place] [facility] if able. However, if higher level of care is needed, he is agreeable to SNF in [facility]. Discussed insurance authorization process and that if denied, pt would be private pay. She has been paying privately while at [facility]. SNF list left in room.   Assessment/plan  status:  Psychosocial Support/Ongoing Assessment of Needs Other assessment/ plan:   Information/referral to community resources:   SNF list    PATIENT'S/FAMILY'S RESPONSE TO PLAN OF CARE: Pt unable to discuss plan of care. Family agreeable to initiate bed search in [facility] in anticipation of need for higher level of care. CSW will continue to follow.       Derenda Fennel, [place] [phone]

## 2014-05-24 LAB — BASIC METABOLIC PANEL
Anion gap: 11 (ref 5–15)
BUN: 19 mg/dL (ref 6–23)
CALCIUM: 8.7 mg/dL (ref 8.4–10.5)
CO2: 25 mEq/L (ref 19–32)
CREATININE: 0.66 mg/dL (ref 0.50–1.10)
Chloride: 103 mEq/L (ref 96–112)
GFR, EST AFRICAN AMERICAN: 86 mL/min — AB (ref >90)
GFR, EST NON AFRICAN AMERICAN: 74 mL/min — AB (ref >90)
Glucose, Bld: 132 mg/dL — ABNORMAL HIGH (ref 70–99)
Potassium: 4.3 mEq/L (ref 3.7–5.3)
Sodium: 139 mEq/L (ref 137–147)

## 2014-05-24 MED ORDER — METOPROLOL TARTRATE 25 MG PO TABS
12.5000 mg | ORAL_TABLET | Freq: Two times a day (BID) | ORAL | Status: DC
  Administered 2014-05-24 – 2014-05-25 (×3): 12.5 mg via ORAL
  Filled 2014-05-24 (×4): qty 1

## 2014-05-24 NOTE — Clinical Social Work Placement (Signed)
Clinical Social Work Department CLINICAL SOCIAL WORK PLACEMENT NOTE 05/24/2014  Patient:  FIDELA, CIESLAK  Account Number:  [id number] Admit date:  05/21/2014  Clinical Social Worker:  Derenda Fennel, LCSW  Date/time:  05/23/2014 02:37 PM  Clinical Social Work is seeking post-discharge placement for this patient at the following level of care:   SKILLED NURSING   (*CSW will update this form in Epic as items are completed)   05/23/2014  Patient/family provided with [facility] Department of Clinical Social Work's list of facilities offering this level of care within the geographic area requested by the patient (or if unable, by the patient's family).  05/23/2014  Patient/family informed of their freedom to choose among providers that offer the needed level of care, that participate in Medicare, Medicaid or managed care program needed by the patient, have an available bed and are willing to accept the patient.  05/23/2014  Patient/family informed of [facility]' ownership interest in [facility], as well as of the fact that they are under no obligation to receive care at this facility.  PASARR submitted to EDS on  PASARR number received on   FL2 transmitted to all facilities in geographic area requested by pt/family on  05/23/2014 FL2 transmitted to all facilities within larger geographic area on   Patient informed that his/her managed care company has contracts with or will negotiate with  certain facilities, including the following:     Patient/family informed of bed offers received:  05/24/2014 Patient chooses bed at [facility] Physician recommends and patient chooses bed at  [facility]  Patient to be transferred to  on   Patient to be transferred to facility by  Patient and family notified of transfer on  Name of family member notified:    The following physician request were entered in Epic:   Additional Comments: Pt has existing pasarr.  Derenda Fennel, [place] [phone]

## 2014-05-24 NOTE — Progress Notes (Signed)
Subjective: The patient remains fairly comfortable. She does have closed left femoral neck fracture. She's been seen by orthopedist and it has been decided that she will not need surgery. Her condition appears stable  Objective: Vital signs in last 24 hours: Temp:  [97.8 F (36.6 C)-98.7 F (37.1 C)] 98.6 F (37 C) (09/16 0541) Pulse Rate:  [82-123] 82 (09/16 0541) Resp:  [18-20] 18 (09/16 0541) BP: (113-139)/(49-78) 113/49 mmHg (09/16 0541) SpO2:  [92 %-98 %] 98 % (09/16 0541) Weight change:  Last BM Date: 05/21/14  Intake/Output from previous day: 09/15 0701 - 09/16 0700 In: 360 [P.O.:360] Out: 600 [Urine:600] Intake/Output this shift:    Physical Exam: General appearance the patient is alert but does not speak  HEENT negative  Heart regular rhythm no murmurs  Lungs clear to P&A  Abdomen no palpable organs or masses  Extremities tenderness over left hip   Recent Labs  05/21/14 2215 05/22/14 0613  WBC 11.6* 10.3  HGB 14.0 13.8  HCT 41.2 41.6  PLT 182 166   BMET  Recent Labs  05/21/14 2215 05/22/14 0613  NA 139 141  K 4.5 5.3  CL 101 104  CO2 23 25  GLUCOSE 137* 109*  BUN 27* 24*  CREATININE 0.68 0.65  CALCIUM 9.7 9.4    Studies/Results: Mr Hip Left Wo Contrast  05/22/2014   CLINICAL DATA:  Left hip pain.  Abnormal x-ray.  EXAM: MR OF THE LEFT HIP WITHOUT CONTRAST  TECHNIQUE: Multiplanar, multisequence MR imaging was performed. No intravenous contrast was administered.  COMPARISON:  Radiographs 05/21/2014.  FINDINGS: Bones: As demonstrated radiographically, there is a moderately displaced acute subcapital fracture of the left femoral neck. The right femoral neck is intact. There is no evidence of dislocation or femoral head avascular necrosis. There appear to be bilateral hip contractures. The visualized bony pelvis appears normal. The sacroiliac joints and symphysis pubis appear normal.  Articular cartilage and labrum  Articular cartilage: No focal  chondral defect or subchondral signal abnormality identified.  Labrum: There is no gross labral tear or paralabral abnormality.  Joint or bursal effusion  Joint effusion: No significant hip joint effusion.  Bursae: No focal periarticular fluid collection.  Muscles and tendons  Muscles and tendons: The gluteus, hamstring and iliopsoas tendons appear normal. The piriformis muscles appear symmetric. There is mild soft tissue hemorrhage/edema anterior to the proximal left femoral fracture.  Other findings  Miscellaneous: There are diverticular changes of the sigmoid colon. Pelvic laxity suspected status post hysterectomy.  IMPRESSION: 1. Moderately displaced subcapital fracture of the left femoral neck. 2. No evidence of dislocation or pelvic fracture. 3. Mild surrounding hemorrhage/edema without large hematoma.   Electronically Signed   By: Roxy Horseman M.D.   On: 05/22/2014 15:55    Medications:  . feeding supplement (ENSURE COMPLETE)  237 mL Oral TID BM  . ferrous sulfate  325 mg Oral Q breakfast  . metoprolol succinate  25 mg Oral Daily  . spironolactone  12.5 mg Oral Daily  . tobramycin-dexamethasone  1 application Left Eye 4 times per day    . sodium chloride 50 mL/hr at 05/23/14 1930     Assessment/Plan: 1. Moderately displaced femoral neck fracture on left-continue comfort measures-the patient will be placed in skilled nursing facility when bed is available  2. Systolic congestive heart failure-stable-continue diuresis with spironolactone  3. Essential hypertension-plan to continue current medication  4. Dementia Alzheimer type-continue current meds   LOS: 3 days   Azuree Minish G 05/24/2014, 6:02  AM

## 2014-05-24 NOTE — Clinical Social Work Note (Addendum)
[facility] assessed pt this morning and feel pt will require higher level of care. CSW discussed this with pt's son Leonette Most. CSW called several facilities for private pay rates as at this point, there is no skilled need for [employer]. Provided information to [facility] and he accepts bed at [facility] and will be completing paperwork this afternoon there. CSW awaiting return call from [facility] to confirm no skilled coverage. [facility] also aware. CSW paged Dr. Romeo Apple and awaiting call at family's request to see if there would be any benefit from PT.    Update: Okay for PT for transfers bed to chair per ortho. Updated [facility] who will submit to [facility] for authorization. Will send PT notes tomorrow.   Derenda Fennel, [place] [phone]

## 2014-05-24 NOTE — Plan of Care (Signed)
Problem: Consults Goal: Skin Care Protocol Initiated - if Braden Score 18 or less If consults are not indicated, leave blank or document N/A  Outcome: Completed/Met Date Met:  05/24/14 Q2 turn, heels elevated, pillow between legs, skin care products in place.  Problem: Phase III Progression Outcomes Goal: Activity at appropriate level-compared to baseline (UP IN CHAIR FOR HEMODIALYSIS)  Outcome: Progressing Patient unable to bear weight on left leg.

## 2014-05-24 NOTE — Progress Notes (Signed)
Nutrition Brief Note  Patient identified as nutrition risk due to low braden score.   Wt Readings from Last 15 Encounters:  05/21/14 130 lb (58.968 kg)  02/05/13 139 lb 8.8 oz (63.3 kg)  01/24/13 130 lb 8.2 oz (59.2 kg)   [name] is a 80 y.o. female with a history of CAD, Systolic CHF, HTN, COPD, Osteoporosis, Dementia, and Previous CVA who is an resident of an area ALF who was found on the floor in her room in the afternoon. She appeared to have pain in her left hip. She was not witnessed as falling. She was evaluated in the ED and was found to have a Left subcapsular femoral neck fracture. She was referred for medical admission  Pt was evaluated by orthopedics, who revealed that no surgical intervention was warranted.   Pt is a resident of [facility]. Discharge disposition is to return to [facility], unless a higher level of care is warranted; CSW prepared to initiate bed search pending therapy recommendations.   Intake has been good; PO: 50-75%. Receives feeding assistance with meals. She also receives Ensure Complete TID (this is consistent with [facility], which reports orders for Ensure TID).   Weight hx reveals pt is maintaining UBW. Brief nutrition focused physical exam revealed some mild fat and muscle depletion, likely due to advanced age and immobility.   Body mass index is 21.63 kg/(m^2). Patient meets criteria for normal weight range based on current BMI.   Current diet order is Heart Healthy, patient is consuming approximately 50-75% of meals at this time. Labs and medications reviewed.   No nutrition interventions warranted at this time. If nutrition issues arise, please consult RD.   Jontez Redfield A. Mayford Knife, RD, LDN Pager: [phone]

## 2014-05-25 ENCOUNTER — Inpatient Hospital Stay (HOSPITAL_COMMUNITY): Payer: Medicare PPO

## 2014-05-25 ENCOUNTER — Inpatient Hospital Stay
Admission: RE | Admit: 2014-05-25 | Discharge: 2014-07-21 | Disposition: A | Discharge status: Skilled Nursing Facility | Payer: Medicare PPO | Source: Ambulatory Visit | Attending: Family Medicine | Admitting: Family Medicine

## 2014-05-25 DIAGNOSIS — R058 Other specified cough: Principal | ICD-10-CM

## 2014-05-25 DIAGNOSIS — R05 Cough: Principal | ICD-10-CM

## 2014-05-25 MED ORDER — FLEET ENEMA 7-19 GM/118ML RE ENEM
1.0000 | ENEMA | Freq: Once | RECTAL | Status: AC
  Administered 2014-05-25: 1 via RECTAL

## 2014-05-25 NOTE — Progress Notes (Signed)
NURSING PROGRESS NOTE  CLAUDETTE WERMUTH [id number] Discharge Data: 05/25/2014 4:18 PM Attending Provider: Alice Reichert, MD WUJ:[id number],WGNFA Reece Agar, MD   [facility] to be D/C'd Skilled nursing facility per MD order.    All IV'swere discontinued and monitored for bleeding.  All belongings were returned to patient for patient to take home.  Vitals signs stable prior to discharge.   Patient transported to [facility] with bed, escorted by RN and NT.  Report given to RN at [facility].  Last Documented Vital Signs:  There were no vitals taken for this visit.  Mertha Baars D

## 2014-05-25 NOTE — Clinical Social Work Placement (Signed)
Clinical Social Work Department CLINICAL SOCIAL WORK PLACEMENT NOTE 05/25/2014  Patient:  [name], [name]  Account Number:  [id number] Admit date:  05/21/2014  Clinical Social Worker:  Derenda Fennel, LCSW  Date/time:  05/23/2014 02:37 PM  Clinical Social Work is seeking post-discharge placement for this patient at the following level of care:   SKILLED NURSING   (*CSW will update this form in Epic as items are completed)   05/23/2014  Patient/family provided with [facility] Department of Clinical Social Work's list of facilities offering this level of care within the geographic area requested by the patient (or if unable, by the patient's family).  05/23/2014  Patient/family informed of their freedom to choose among providers that offer the needed level of care, that participate in Medicare, Medicaid or managed care program needed by the patient, have an available bed and are willing to accept the patient.  05/23/2014  Patient/family informed of [facility]' ownership interest in [facility], as well as of the fact that they are under no obligation to receive care at this facility.  PASARR submitted to EDS on  PASARR number received on   FL2 transmitted to all facilities in geographic area requested by pt/family on  05/23/2014 FL2 transmitted to all facilities within larger geographic area on   Patient informed that his/her managed care company has contracts with or will negotiate with  certain facilities, including the following:     Patient/family informed of bed offers received:  05/24/2014 Patient chooses bed at [facility] Physician recommends and patient chooses bed at  [facility]  Patient to be transferred to [facility] on  05/25/2014 Patient to be transferred to facility by RN Patient and family notified of transfer on 05/25/2014 Name of family member notified:  Leonette Most- son  The following physician request were entered in  Epic:   Additional Comments: Pt has existing pasarr.  Derenda Fennel, [place] [phone]

## 2014-05-25 NOTE — Plan of Care (Signed)
Problem: Phase II Progression Outcomes Goal: Bed to chair Outcome: Not Progressing Per progress note from PT, patient unable to tolerate transfer from bed to chair.  Problem: Phase III Progression Outcomes Goal: Activity at appropriate level-compared to baseline (UP IN CHAIR FOR HEMODIALYSIS)  Outcome: Not Progressing Patient will need further treatment at SNF.

## 2014-05-25 NOTE — Discharge Summary (Signed)
Physician Discharge Summary  [name] Denard GNF:[id number] DOB: 78/07/1919 DOA: 78/13/2015  PCP: Alice Reichert, MD  Admit date: 05/21/2014 Discharge date: 05/25/2014     Discharge Diagnoses:  1. Subcapital fracture left femoral neck 2. Systolic congestive heart failure 3. Essential hypertension 4. Dementia of Alzheimer type 5. Previous CVA 6. Coronary artery disease chronic Discharge Condition: Stable Disposition: Discharged to nursing facility Diet recommendation: Heart healthy diet  Filed Weights   05/21/14 2026 05/25/14 0528  Weight: 58.968 kg (130 lb) 55.6 kg (122 lb 9.2 oz)    History of present illness:  The patient has a history coronary artery disease systolic congestive heart failure hypertension COPD and previous CVA. She was found on the floor and nursing facility had pain in her left hip l was brought to the ED where she was examined x-ray showed left subcapital femoral neck fracture and facial was admitted to medical surgical floor  Hospital Course:  The patient was admitted to MedSurg floor. She was continued on her previous medications as listed below. She was seen in consultation by orthopedic physician Dr. Romeo Apple. He evaluated the patient and decided that she was not a surgical candidate. She did have chest x-ray on admission which revealed mild interstitial edema. This was repeated later showed improvement. Arrangements were made by social services to have her placed in skilled facility for rehabilitation and further treatment. The spleen were discussed with family and they were in agreement.   Discharge Instructions The patient is to be discharged to skilled nursing facility. She is to continue medications listed below. And will continue rehabilitation there.    Medication List         clopidogrel 75 MG tablet  Commonly known as:  PLAVIX  Take 75 mg by mouth daily.     ENSURE PO  Take 1 Can by mouth 3 (three) times daily with meals.     ferrous  sulfate 325 (65 FE) MG tablet  Take 325 mg by mouth daily with breakfast. Crush and mix with applesauce.     HYDROcodone-acetaminophen 5-325 MG per tablet  Commonly known as:  NORCO/VICODIN  Take 0.5 tablets by mouth 2 (two) times daily as needed (pain).     metoprolol succinate 25 MG 24 hr tablet  Commonly known as:  TOPROL-XL  Take 25 mg by mouth daily.     OCUSOFT LID SCRUB EX  Apply topically 2 (two) times daily.     spironolactone 25 MG tablet  Commonly known as:  ALDACTONE  Take 12.5 mg by mouth daily.     tobramycin-dexamethasone ophthalmic ointment  Commonly known as:  TOBRADEX  Place 1 application into the left eye every 6 (six) hours. For 2 weeks stop 05/25/14       No Known Allergies  The results of significant diagnostics from this hospitalization (including imaging, microbiology, ancillary and laboratory) are listed below for reference.    Significant Diagnostic Studies: Dg Chest 1 View  05/25/2014   CLINICAL DATA:  Pulmonary edema  EXAM: CHEST - 1 VIEW  COMPARISON:  Prior chest x-ray 05/21/2014  FINDINGS: Stable cardiomegaly with left heart enlargement. Atherosclerotic calcifications throughout the transverse and descending thoracic aorta. Improving bilateral interstitial opacities consistent with slightly reduced interstitial edema. Central bronchitic changes and likely chronic interstitial changes remain present. No pneumothorax or pleural effusion. No focal airspace consolidation. No acute osseous abnormality. Healed right proximal humerus fracture.  IMPRESSION: 1. Slight interval improvement in interstitial pulmonary edema. 2. No new or acute cardiopulmonary process.  Electronically Signed   By: Malachy Moan M.D.   On: 05/25/2014 08:14   Dg Chest 1 View  05/21/2014   CLINICAL DATA:  Unwitnessed fall.  Concern for chest injury.  EXAM: CHEST - 1 VIEW  COMPARISON:  Chest radiograph from 02/04/2013  FINDINGS: The lungs are well-aerated. Vascular congestion is  noted. Peribronchial thickening is seen. Increased interstitial markings may reflect mild interstitial edema. There is no evidence of pleural effusion or pneumothorax.  The cardiomediastinal silhouette is borderline normal in size. No acute osseous abnormalities are seen.  IMPRESSION: Vascular congestion noted, with underlying peribronchial thickening. Increased interstitial markings may reflect mild interstitial edema, possibly transient in nature. No displaced rib fractures identified.   Electronically Signed   By: Roanna Raider M.D.   On: 05/21/2014 21:30   Dg Thoracic Spine 2 View  05/21/2014   CLINICAL DATA:  Unwitnessed fall. Found on floor. Favors left hip. Nonverbal. Combative. History of stroke, dementia, MI, hypertension.  EXAM: THORACIC SPINE - 2 VIEW  COMPARISON:  02/04/2013  FINDINGS: There is a wedge compression fracture of T11, of indeterminate age. Patient is kyphotic. There is superior endplate fracture of T12, only partially imaged. No suspicious lytic or blastic lesions are identified.  IMPRESSION: Wedge compression fracture T11.  Superior endplate fracture at T12.   Electronically Signed   By: Rosalie Gums M.D.   On: 05/21/2014 21:28   Dg Lumbar Spine Complete  05/21/2014   CLINICAL DATA:  Fall.  Back pain.  EXAM: LUMBAR SPINE - COMPLETE 4+ VIEW  COMPARISON:  None.  FINDINGS: Mild levoconvex curve of the lumbar spine on the frontal view. Lumbar vertebral body height is preserved. No fracture. Osteopenia. Aortoiliac atherosclerosis. No spondylolisthesis.  IMPRESSION: Negative.   Electronically Signed   By: Andreas Newport M.D.   On: 05/21/2014 21:28   Dg Hip Bilateral W/pelvis  05/21/2014   CLINICAL DATA:  Fall.  LEFT hip pain.  EXAM: BILATERAL HIP WITH PELVIS - 4+ VIEW  COMPARISON:  09/08/2013.  FINDINGS: There is an abnormal angle at the LEFT femoral head and neck junction. This angulation is new compared 09/08/2013. On the lateral view of the hip, of the fracture is better visualized,  which appears to be a subcapital mildly impacted fracture. Atherosclerosis is noted. Probable myositis ossificans over the proximal LEFT femoral shaft. Visible obturator rings appear normal. RIGHT acetabular labrum chondrocalcinosis. No RIGHT hip fracture.  IMPRESSION: Subcapital LEFT femoral neck fracture.   Electronically Signed   By: Andreas Newport M.D.   On: 05/21/2014 21:30   Ct Head Wo Contrast  05/21/2014   CLINICAL DATA:  Fall.  Head trauma.  Neck pain.  EXAM: CT HEAD WITHOUT CONTRAST  CT CERVICAL SPINE WITHOUT CONTRAST  TECHNIQUE: Multidetector CT imaging of the head and cervical spine was performed following the standard protocol without intravenous contrast. Multiplanar CT image reconstructions of the cervical spine were also generated.  COMPARISON:  04/16/2014.  FINDINGS: CT HEAD FINDINGS  No mass lesion, mass effect, midline shift, hydrocephalus, hemorrhage. No acute territorial cortical ischemia/infarct. Atrophy and chronic ischemic white matter disease is present. Bilateral basal ganglia infarcts are present. RIGHT cerebellar encephalomalacia is unchanged. Calvarium intact.  CT CERVICAL SPINE FINDINGS  Severe cervical spondylosis with degenerative anterolisthesis of C3 on C4 and C4 on C5. Multilevel disc osteophyte complexes. No cervical spine fracture. Ankylosis of C5-C6. The spondylolisthesis appears degenerative and facet mediated. On the coronal images, there is lucency in the anterior RIGHT lateral mass of C2. On the  axial images, this represents a prominent vascular channel rather than a fracture. This is unchanged compared to prior. Mild levoconvex torticollis is similar to prior studies. Atherosclerosis. Bilateral apical scarring in the lungs. Calcification of the transverse ligament of the atlas. Vertebral artery atherosclerosis. The odontoid is intact. Occipital condyles appear intact. There is some motion artifact over the atlanto occipital junction. Severe RIGHT temporomandibular joint  osteoarthritis is incidentally noted.  IMPRESSION: 1. No acute intracranial abnormality. Atrophy, chronic ischemic white matter disease and old infarcts. 2. No acute cervical spine injury.  Severe degenerative disease.   Electronically Signed   By: Andreas Newport M.D.   On: 05/21/2014 22:00   Ct Cervical Spine Wo Contrast  05/21/2014   CLINICAL DATA:  Fall.  Head trauma.  Neck pain.  EXAM: CT HEAD WITHOUT CONTRAST  CT CERVICAL SPINE WITHOUT CONTRAST  TECHNIQUE: Multidetector CT imaging of the head and cervical spine was performed following the standard protocol without intravenous contrast. Multiplanar CT image reconstructions of the cervical spine were also generated.  COMPARISON:  04/16/2014.  FINDINGS: CT HEAD FINDINGS  No mass lesion, mass effect, midline shift, hydrocephalus, hemorrhage. No acute territorial cortical ischemia/infarct. Atrophy and chronic ischemic white matter disease is present. Bilateral basal ganglia infarcts are present. RIGHT cerebellar encephalomalacia is unchanged. Calvarium intact.  CT CERVICAL SPINE FINDINGS  Severe cervical spondylosis with degenerative anterolisthesis of C3 on C4 and C4 on C5. Multilevel disc osteophyte complexes. No cervical spine fracture. Ankylosis of C5-C6. The spondylolisthesis appears degenerative and facet mediated. On the coronal images, there is lucency in the anterior RIGHT lateral mass of C2. On the axial images, this represents a prominent vascular channel rather than a fracture. This is unchanged compared to prior. Mild levoconvex torticollis is similar to prior studies. Atherosclerosis. Bilateral apical scarring in the lungs. Calcification of the transverse ligament of the atlas. Vertebral artery atherosclerosis. The odontoid is intact. Occipital condyles appear intact. There is some motion artifact over the atlanto occipital junction. Severe RIGHT temporomandibular joint osteoarthritis is incidentally noted.  IMPRESSION: 1. No acute intracranial  abnormality. Atrophy, chronic ischemic white matter disease and old infarcts. 2. No acute cervical spine injury.  Severe degenerative disease.   Electronically Signed   By: Andreas Newport M.D.   On: 05/21/2014 22:00   Mr Hip Left Wo Contrast  05/22/2014   CLINICAL DATA:  Left hip pain.  Abnormal x-ray.  EXAM: MR OF THE LEFT HIP WITHOUT CONTRAST  TECHNIQUE: Multiplanar, multisequence MR imaging was performed. No intravenous contrast was administered.  COMPARISON:  Radiographs 05/21/2014.  FINDINGS: Bones: As demonstrated radiographically, there is a moderately displaced acute subcapital fracture of the left femoral neck. The right femoral neck is intact. There is no evidence of dislocation or femoral head avascular necrosis. There appear to be bilateral hip contractures. The visualized bony pelvis appears normal. The sacroiliac joints and symphysis pubis appear normal.  Articular cartilage and labrum  Articular cartilage: No focal chondral defect or subchondral signal abnormality identified.  Labrum: There is no gross labral tear or paralabral abnormality.  Joint or bursal effusion  Joint effusion: No significant hip joint effusion.  Bursae: No focal periarticular fluid collection.  Muscles and tendons  Muscles and tendons: The gluteus, hamstring and iliopsoas tendons appear normal. The piriformis muscles appear symmetric. There is mild soft tissue hemorrhage/edema anterior to the proximal left femoral fracture.  Other findings  Miscellaneous: There are diverticular changes of the sigmoid colon. Pelvic laxity suspected status post hysterectomy.  IMPRESSION: 1. Moderately  displaced subcapital fracture of the left femoral neck. 2. No evidence of dislocation or pelvic fracture. 3. Mild surrounding hemorrhage/edema without large hematoma.   Electronically Signed   By: Roxy Horseman M.D.   On: 05/22/2014 15:55    Microbiology: Recent Results (from the past 240 hour(s))  SURGICAL PCR SCREEN     Status: None    Collection Time    05/21/14 11:59 PM      Result Value Ref Range Status   MRSA, PCR NEGATIVE  NEGATIVE Final   Staphylococcus aureus NEGATIVE  NEGATIVE Final   Comment:            The Xpert SA Assay (FDA     approved for NASAL specimens     in patients over 52 years of age),     is one component of     a comprehensive surveillance     program.  Test performance has     been validated by [employer] for patients greater     than or equal to 38 year old.     It is not intended     to diagnose infection nor to     guide or monitor treatment.     Labs: Basic Metabolic Panel:  Recent Labs Lab 05/21/14 2215 05/22/14 0613 05/24/14 0558  NA 139 141 139  K 4.5 5.3 4.3  CL 101 104 103  CO2 GLUCOSE 137* 109* 132*  BUN 27* 24* 19  CREATININE 0.68 0.65 0.66  CALCIUM 9.7 9.4 8.7   Liver Function Tests: No results found for this basename: AST, ALT, ALKPHOS, BILITOT, PROT, ALBUMIN,  in the last 168 hours No results found for this basename: LIPASE, AMYLASE,  in the last 168 hours No results found for this basename: AMMONIA,  in the last 168 hours CBC:  Recent Labs Lab 05/21/14 2215 05/22/14 0613  WBC 11.6* 10.3  NEUTROABS 9.8*  --   HGB 14.0 13.8  HCT 41.2 41.6  MCV 89.8 90.8  PLT 182 166   Cardiac Enzymes: No results found for this basename: CKTOTAL, CKMB, CKMBINDEX, TROPONINI,  in the last 168 hours BNP: BNP (last 3 results) No results found for this basename: PROBNP,  in the last 8760 hours CBG: No results found for this basename: GLUCAP,  in the last 168 hours  Principal Problem:   Closed left hip fracture Active Problems:   Essential hypertension   Dementia   CVA (cerebral vascular accident)   COPD (chronic obstructive pulmonary disease)   Systolic HF (heart failure)   Wedge compression fracture of T11 vertebra   Osteoporosis   Prerenal azotemia   CAD (coronary artery disease)   Time coordinating discharge: 45 minutes  Signed:  Butch Penny, MD 05/25/2014, 1:13 PM

## 2014-05-25 NOTE — Evaluation (Signed)
Physical Therapy Evaluation Patient Details Name: [name] MRN: [id number] DOB: [birth year] Today's Date: 05/25/2014   History of Present Illness  Pt is a 80 year old female, resident of [address], who was found on the floor and found to have a left femoral neck fracture.  Medical history includes CAD, CHF,HTN, COPD, osteoporosis, CVA and dementia.  No surgery is planned and pt is seen for a PT evaluation with a request to transfer bed to chair.  She has not walked in a year per hospital report.  Clinical Impression   Pt was seen for an evaluation.  She was awake but unable to respond in any way except to grimace in pain when being moved or to smile when I was leaving.  She was positioned in the bed in semi sidelying on the left with hips and knees fully flexed.   I was unable to mobilize her out of this position without causing her severe pain.  It will require a full mechanical lift to transfer bed to chair.  Because of her dementia and the distress/pain this would cause her, I am not sure it would bed worth it at this point.  Hopefully, with a little time, her mobility can be increased.  She will not be appropriate for SNF and will need total care.    Follow Up Recommendations No PT follow up    Equipment Recommendations  None recommended by PT    Recommendations for Other Services   none    Precautions / Restrictions Precautions Precautions: Fall Restrictions Weight Bearing Restrictions: Yes RLE Weight Bearing: Weight bearing as tolerated LLE Weight Bearing: Non weight bearing      Mobility  Bed Mobility Overal bed mobility: +2 for physical assistance                Transfers                General transfer comment: pt will need a full lift for transfer out of bed  Ambulation/Gait                Stairs            Wheelchair Mobility    Modified Rankin (Stroke Patients Only)       Balance Overall balance assessment:  (unable  to test)                                           Pertinent Vitals/Pain Pain Assessment: Faces Pain Score: 0-No pain Faces Pain Scale: No hurt Pain Location: pt has no apparent pain at rest...she is smiling.  However, with the movement of either lowere extremety she grimaces severely and pulls away Pain Intervention(s): Repositioned (PT stopped)    Home Living Family/patient expects to be discharged to:: Other (Comment)                 Additional Comments: pt will need a nursing home setting    Prior Function Level of Independence: Needs assistance   Gait / Transfers Assistance Needed: not ambulatory, assistance with transfers is unknown  ADL's / Homemaking Assistance Needed: total assist        Hand Dominance        Extremity/Trunk Assessment   Upper Extremity Assessment: Defer to OT evaluation           Lower Extremity Assessment: RLE deficits/detail;LLE deficits/detail RLE Deficits /  Details: Pt's hip and knee are maintained in full flexion and hip adduction.  I could not mobilize her out of this position more than 20% without causing her severe distress. LLE Deficits / Details: Pt's hip and knee are maintained in full flexion with hip abduction.  I was unable to mobilize her out of this position more than 5% without causing severe pain.     Communication   Communication: Expressive difficulties (pt does not speak)  Cognition Arousal/Alertness: Awake/alert Behavior During Therapy: Flat affect Overall Cognitive Status: History of cognitive impairments - at baseline                      General Comments      Exercises        Assessment/Plan    PT Assessment Patent does not need any further PT services  PT Diagnosis     PT Problem List    PT Treatment Interventions     PT Goals (Current goals can be found in the Care Plan section) Acute Rehab PT Goals PT Goal Formulation: No goals set, d/c therapy    Frequency      Barriers to discharge        Co-evaluation               End of Session   Activity Tolerance: Patient limited by pain Patient left: in bed;with call bell/phone within reach;with nursing/sitter in room Nurse Communication: Mobility status         Time: 1610-9604 PT Time Calculation (min): 17 min   Charges:   PT Evaluation $Initial PT Evaluation Tier I: 1 Procedure     PT G CodesMyrlene Broker L 05/25/2014, 9:04 AM

## 2014-05-25 NOTE — Care Management Note (Signed)
    Page 1 of 1   05/25/2014     1:40:51 PM CARE MANAGEMENT NOTE 05/25/2014  Patient:  MARBELLA, MARKGRAF   Account Number:  [id number]  Date Initiated:  05/25/2014  Documentation initiated by:  Sharrie Rothman  Subjective/Objective Assessment:   Pt admitted from [facility] with closed hip fracture. Pt will need higher level of care at discharge.     Action/Plan:   Pt will discharge today to [facility]. CSW to arrange discharge to facility.   Anticipated DC Date:  05/25/2014   Anticipated DC Plan:  SKILLED NURSING FACILITY  In-house referral  Clinical Social Worker      DC Planning Services  CM consult      Choice offered to / List presented to:             Status of service:  Completed, signed off Medicare Important Message given?  YES (If response is "NO", the following Medicare IM given date fields will be blank) Date Medicare IM given:  05/25/2014 Medicare IM given by:  Sharrie Rothman Date Additional Medicare IM given:   Additional Medicare IM given by:    Discharge Disposition:  SKILLED NURSING FACILITY  Per UR Regulation:    If discussed at Long Length of Stay Meetings, dates discussed:    Comments:  05/25/14 1340 Arlyss Queen, RN BSN CM

## 2014-05-25 NOTE — Clinical Social Work Note (Signed)
Pt d/c today to [facility]. Pt unable to participate with PT and is not appropriate for SNF per PT. Son notified and aware pt will be private pay at [facility]. He has already completed paperwork there. Pt to transfer with RN. D/C summary faxed.  Derenda Fennel, [place] [phone]

## 2014-05-26 ENCOUNTER — Other Ambulatory Visit: Payer: Self-pay | Admitting: *Deleted

## 2014-05-26 MED ORDER — HYDROCODONE-ACETAMINOPHEN 5-325 MG PO TABS: ORAL_TABLET | ORAL | Status: AC

## 2014-05-26 NOTE — Progress Notes (Signed)
UR chart review completed.  

## 2014-05-26 NOTE — Telephone Encounter (Signed)
[facility] 

## 2014-05-27 ENCOUNTER — Ambulatory Visit (HOSPITAL_COMMUNITY): Payer: Medicare PPO | Attending: Family Medicine

## 2014-05-27 DIAGNOSIS — R0602 Shortness of breath: Secondary | ICD-10-CM | POA: Diagnosis not present

## 2014-05-27 DIAGNOSIS — R059 Cough, unspecified: Secondary | ICD-10-CM | POA: Insufficient documentation

## 2014-05-27 DIAGNOSIS — R05 Cough: Secondary | ICD-10-CM | POA: Insufficient documentation

## 2014-05-27 DIAGNOSIS — R509 Fever, unspecified: Secondary | ICD-10-CM | POA: Insufficient documentation

## 2014-07-21 ENCOUNTER — Inpatient Hospital Stay (HOSPITAL_COMMUNITY)
Admission: EM | Admit: 2014-07-21 | Discharge: 2014-08-03 | DRG: 871 | Disposition: A | Discharge status: Skilled Nursing Facility | Payer: Medicare PPO | Attending: Family Medicine | Admitting: Family Medicine

## 2014-07-21 ENCOUNTER — Encounter (HOSPITAL_COMMUNITY): Payer: Self-pay | Admitting: Emergency Medicine

## 2014-07-21 ENCOUNTER — Emergency Department (HOSPITAL_COMMUNITY): Payer: Medicare PPO

## 2014-07-21 DIAGNOSIS — L89322 Pressure ulcer of left buttock, stage 2: Secondary | ICD-10-CM | POA: Diagnosis present

## 2014-07-21 DIAGNOSIS — G9340 Encephalopathy, unspecified: Secondary | ICD-10-CM | POA: Diagnosis present

## 2014-07-21 DIAGNOSIS — A419 Sepsis, unspecified organism: Principal | ICD-10-CM | POA: Diagnosis present

## 2014-07-21 DIAGNOSIS — Z8673 Personal history of transient ischemic attack (TIA), and cerebral infarction without residual deficits: Secondary | ICD-10-CM | POA: Diagnosis not present

## 2014-07-21 DIAGNOSIS — F0390 Unspecified dementia without behavioral disturbance: Secondary | ICD-10-CM | POA: Diagnosis present

## 2014-07-21 DIAGNOSIS — J189 Pneumonia, unspecified organism: Secondary | ICD-10-CM

## 2014-07-21 DIAGNOSIS — M810 Age-related osteoporosis without current pathological fracture: Secondary | ICD-10-CM | POA: Diagnosis present

## 2014-07-21 DIAGNOSIS — I5022 Chronic systolic (congestive) heart failure: Secondary | ICD-10-CM | POA: Diagnosis present

## 2014-07-21 DIAGNOSIS — Z66 Do not resuscitate: Secondary | ICD-10-CM | POA: Diagnosis present

## 2014-07-21 DIAGNOSIS — Y95 Nosocomial condition: Secondary | ICD-10-CM | POA: Diagnosis present

## 2014-07-21 DIAGNOSIS — J9819 Other pulmonary collapse: Secondary | ICD-10-CM | POA: Diagnosis present

## 2014-07-21 DIAGNOSIS — E876 Hypokalemia: Secondary | ICD-10-CM | POA: Diagnosis present

## 2014-07-21 DIAGNOSIS — R739 Hyperglycemia, unspecified: Secondary | ICD-10-CM | POA: Diagnosis present

## 2014-07-21 DIAGNOSIS — R509 Fever, unspecified: Secondary | ICD-10-CM | POA: Diagnosis present

## 2014-07-21 DIAGNOSIS — I10 Essential (primary) hypertension: Secondary | ICD-10-CM | POA: Diagnosis present

## 2014-07-21 DIAGNOSIS — J449 Chronic obstructive pulmonary disease, unspecified: Secondary | ICD-10-CM | POA: Diagnosis present

## 2014-07-21 DIAGNOSIS — Z09 Encounter for follow-up examination after completed treatment for conditions other than malignant neoplasm: Secondary | ICD-10-CM

## 2014-07-21 DIAGNOSIS — J180 Bronchopneumonia, unspecified organism: Secondary | ICD-10-CM | POA: Diagnosis present

## 2014-07-21 DIAGNOSIS — I252 Old myocardial infarction: Secondary | ICD-10-CM | POA: Diagnosis not present

## 2014-07-21 DIAGNOSIS — E870 Hyperosmolality and hypernatremia: Secondary | ICD-10-CM | POA: Diagnosis present

## 2014-07-21 DIAGNOSIS — E860 Dehydration: Secondary | ICD-10-CM | POA: Diagnosis present

## 2014-07-21 DIAGNOSIS — E43 Unspecified severe protein-calorie malnutrition: Secondary | ICD-10-CM | POA: Diagnosis present

## 2014-07-21 DIAGNOSIS — Z6820 Body mass index (BMI) 20.0-20.9, adult: Secondary | ICD-10-CM

## 2014-07-21 LAB — COMPREHENSIVE METABOLIC PANEL
ALK PHOS: 86 U/L (ref 39–117)
ALT: 14 U/L (ref 0–35)
AST: 14 U/L (ref 0–37)
Albumin: 1.9 g/dL — ABNORMAL LOW (ref 3.5–5.2)
BILIRUBIN TOTAL: 0.3 mg/dL (ref 0.3–1.2)
BUN: 52 mg/dL — AB (ref 6–23)
CO2: 22 meq/L (ref 19–32)
Calcium: 8.7 mg/dL (ref 8.4–10.5)
Chloride: >130 mEq/L (ref 96–112)
Creatinine, Ser: 0.74 mg/dL (ref 0.50–1.10)
GFR, EST AFRICAN AMERICAN: 82 mL/min — AB (ref >90)
GFR, EST NON AFRICAN AMERICAN: 71 mL/min — AB (ref >90)
GLUCOSE: 350 mg/dL — AB (ref 70–99)
POTASSIUM: 3.3 meq/L — AB (ref 3.7–5.3)
SODIUM: 169 meq/L — AB (ref 137–147)
Total Protein: 6.2 g/dL (ref 6.0–8.3)

## 2014-07-21 LAB — CBC WITH DIFFERENTIAL/PLATELET
Basophils Absolute: 0 10*3/uL (ref 0.0–0.1)
Basophils Relative: 0 % (ref 0–1)
EOS ABS: 0 10*3/uL (ref 0.0–0.7)
Eosinophils Relative: 0 % (ref 0–5)
HCT: 38.6 % (ref 36.0–46.0)
HEMOGLOBIN: 10.9 g/dL — AB (ref 12.0–15.0)
LYMPHS PCT: 18 % (ref 12–46)
Lymphs Abs: 2.3 10*3/uL (ref 0.7–4.0)
MCH: 28.2 pg (ref 26.0–34.0)
MCHC: 28.2 g/dL — ABNORMAL LOW (ref 30.0–36.0)
MCV: 100 fL (ref 78.0–100.0)
MONOS PCT: 2 % — AB (ref 3–12)
Monocytes Absolute: 0.3 10*3/uL (ref 0.1–1.0)
NEUTROS PCT: 80 % — AB (ref 43–77)
Neutro Abs: 10.2 10*3/uL — ABNORMAL HIGH (ref 1.7–7.7)
PLATELETS: 270 10*3/uL (ref 150–400)
RBC: 3.86 MIL/uL — AB (ref 3.87–5.11)
RDW: 17.3 % — ABNORMAL HIGH (ref 11.5–15.5)
WBC: 12.7 10*3/uL — ABNORMAL HIGH (ref 4.0–10.5)

## 2014-07-21 LAB — BASIC METABOLIC PANEL
ANION GAP: 14 (ref 5–15)
Anion gap: 11 (ref 5–15)
BUN: 49 mg/dL — ABNORMAL HIGH (ref 6–23)
BUN: 49 mg/dL — AB (ref 6–23)
CHLORIDE: 128 meq/L — AB (ref 96–112)
CO2: 23 meq/L (ref 19–32)
CO2: 26 meq/L (ref 19–32)
CREATININE: 0.67 mg/dL (ref 0.50–1.10)
Calcium: 8.4 mg/dL (ref 8.4–10.5)
Calcium: 8.5 mg/dL (ref 8.4–10.5)
Chloride: 129 mEq/L — ABNORMAL HIGH (ref 96–112)
Creatinine, Ser: 0.73 mg/dL (ref 0.50–1.10)
GFR calc Af Amer: 83 mL/min — ABNORMAL LOW (ref >90)
GFR calc Af Amer: 85 mL/min — ABNORMAL LOW (ref >90)
GFR calc non Af Amer: 73 mL/min — ABNORMAL LOW (ref >90)
GFR, EST NON AFRICAN AMERICAN: 71 mL/min — AB (ref >90)
Glucose, Bld: 349 mg/dL — ABNORMAL HIGH (ref 70–99)
Glucose, Bld: 256 mg/dL — ABNORMAL HIGH (ref 70–99)
Potassium: 3.2 mEq/L — ABNORMAL LOW (ref 3.7–5.3)
Potassium: 3.7 mEq/L (ref 3.7–5.3)
SODIUM: 166 meq/L — AB (ref 137–147)
Sodium: 165 mEq/L (ref 137–147)

## 2014-07-21 LAB — URINALYSIS, ROUTINE W REFLEX MICROSCOPIC
BILIRUBIN URINE: NEGATIVE
GLUCOSE, UA: NEGATIVE mg/dL
HGB URINE DIPSTICK: NEGATIVE
KETONES UR: NEGATIVE mg/dL
Nitrite: NEGATIVE
Specific Gravity, Urine: >1.03 — ABNORMAL HIGH (ref 1.005–1.030)
UROBILINOGEN UA: 0.2 mg/dL (ref 0.0–1.0)
pH: 5 (ref 5.0–8.0)

## 2014-07-21 LAB — I-STAT CG4 LACTIC ACID, ED: Lactic Acid, Venous: 7.49 mmol/L — ABNORMAL HIGH (ref 0.5–2.2)

## 2014-07-21 LAB — URINE MICROSCOPIC-ADD ON

## 2014-07-21 LAB — GLUCOSE, CAPILLARY
GLUCOSE-CAPILLARY: 182 mg/dL — AB (ref 70–99)
GLUCOSE-CAPILLARY: 240 mg/dL — AB (ref 70–99)

## 2014-07-21 LAB — PRO B NATRIURETIC PEPTIDE: Pro B Natriuretic peptide (BNP): 3855 pg/mL — ABNORMAL HIGH (ref 0–450)

## 2014-07-21 LAB — TROPONIN I

## 2014-07-21 LAB — LACTIC ACID, PLASMA: Lactic Acid, Venous: 3.9 mmol/L — ABNORMAL HIGH (ref 0.5–2.2)

## 2014-07-21 LAB — MRSA PCR SCREENING: MRSA by PCR: NEGATIVE

## 2014-07-21 MED ORDER — ASPIRIN 81 MG PO CHEW: 324.0000 mg | CHEWABLE_TABLET | ORAL | Status: AC

## 2014-07-21 MED ORDER — PIPERACILLIN-TAZOBACTAM 3.375 G IVPB 30 MIN
3.3750 g | Freq: Once | INTRAVENOUS | Status: AC
  Administered 2014-07-21: 3.375 g via INTRAVENOUS
  Filled 2014-07-21: qty 50

## 2014-07-21 MED ORDER — PIPERACILLIN-TAZOBACTAM 3.375 G IVPB
3.3750 g | Freq: Three times a day (TID) | INTRAVENOUS | Status: DC
  Administered 2014-07-21 – 2014-08-03 (×38): 3.375 g via INTRAVENOUS
  Filled 2014-07-21 (×42): qty 50

## 2014-07-21 MED ORDER — OCUSOFT LID SCRUB FOAMING EX SOLN: 1.0000 "application " | Freq: Two times a day (BID) | CUTANEOUS | Status: DC

## 2014-07-21 MED ORDER — ONDANSETRON HCL 4 MG/2ML IJ SOLN: 4.0000 mg | Freq: Four times a day (QID) | INTRAMUSCULAR | Status: DC | PRN

## 2014-07-21 MED ORDER — DEXTROSE 5 % IV SOLN
INTRAVENOUS | Status: DC
  Administered 2014-07-21: 19:00:00 via INTRAVENOUS

## 2014-07-21 MED ORDER — SODIUM CHLORIDE 0.9 % IV SOLN: 250.0000 mL | INTRAVENOUS | Status: DC | PRN

## 2014-07-21 MED ORDER — PIPERACILLIN-TAZOBACTAM 3.375 G IVPB 30 MIN: 3.3750 g | Freq: Once | INTRAVENOUS | Status: DC

## 2014-07-21 MED ORDER — VANCOMYCIN HCL IN DEXTROSE 750-5 MG/150ML-% IV SOLN
750.0000 mg | INTRAVENOUS | Status: DC
  Administered 2014-07-22 – 2014-07-26 (×5): 750 mg via INTRAVENOUS
  Filled 2014-07-21 (×6): qty 150

## 2014-07-21 MED ORDER — INSULIN ASPART 100 UNIT/ML ~~LOC~~ SOLN
0.0000 [IU] | Freq: Three times a day (TID) | SUBCUTANEOUS | Status: DC
  Administered 2014-07-22: 2 [IU] via SUBCUTANEOUS
  Administered 2014-07-22 (×2): 1 [IU] via SUBCUTANEOUS
  Administered 2014-07-23 – 2014-07-24 (×4): 2 [IU] via SUBCUTANEOUS
  Administered 2014-07-24 (×2): 1 [IU] via SUBCUTANEOUS
  Administered 2014-07-24: 2 [IU] via SUBCUTANEOUS

## 2014-07-21 MED ORDER — TOBRAMYCIN-DEXAMETHASONE 0.3-0.1 % OP OINT
1.0000 "application " | TOPICAL_OINTMENT | Freq: Four times a day (QID) | OPHTHALMIC | Status: DC
  Filled 2014-07-21: qty 3.5

## 2014-07-21 MED ORDER — ASPIRIN 300 MG RE SUPP
300.0000 mg | RECTAL | Status: AC
  Administered 2014-07-21: 300 mg via RECTAL
  Filled 2014-07-21: qty 1

## 2014-07-21 MED ORDER — POTASSIUM CHLORIDE 10 MEQ/100ML IV SOLN
10.0000 meq | INTRAVENOUS | Status: AC
  Administered 2014-07-21 (×3): 10 meq via INTRAVENOUS
  Filled 2014-07-21: qty 100

## 2014-07-21 MED ORDER — CHLORHEXIDINE GLUCONATE 0.12 % MT SOLN
15.0000 mL | Freq: Two times a day (BID) | OROMUCOSAL | Status: DC
  Administered 2014-07-21 – 2014-08-03 (×22): 15 mL via OROMUCOSAL
  Filled 2014-07-21 (×22): qty 15

## 2014-07-21 MED ORDER — ACETAMINOPHEN 650 MG RE SUPP
650.0000 mg | Freq: Once | RECTAL | Status: AC
  Administered 2014-07-21: 650 mg via RECTAL
  Filled 2014-07-21: qty 1

## 2014-07-21 MED ORDER — ENOXAPARIN SODIUM 40 MG/0.4ML ~~LOC~~ SOLN
40.0000 mg | SUBCUTANEOUS | Status: DC
  Administered 2014-07-21 – 2014-08-02 (×13): 40 mg via SUBCUTANEOUS
  Filled 2014-07-21 (×13): qty 0.4

## 2014-07-21 MED ORDER — SODIUM CHLORIDE 0.9 % IV BOLUS (SEPSIS)
1000.0000 mL | Freq: Once | INTRAVENOUS | Status: AC
  Administered 2014-07-21: 1000 mL via INTRAVENOUS

## 2014-07-21 MED ORDER — PANTOPRAZOLE SODIUM 40 MG IV SOLR
40.0000 mg | Freq: Every day | INTRAVENOUS | Status: DC
  Administered 2014-07-21 – 2014-08-02 (×13): 40 mg via INTRAVENOUS
  Filled 2014-07-21 (×13): qty 40

## 2014-07-21 MED ORDER — SODIUM CHLORIDE 0.45 % IV SOLN
INTRAVENOUS | Status: DC
  Administered 2014-07-21: 16:00:00 via INTRAVENOUS

## 2014-07-21 MED ORDER — SODIUM CHLORIDE 0.45 % IV SOLN: INTRAVENOUS | Status: DC

## 2014-07-21 MED ORDER — CETYLPYRIDINIUM CHLORIDE 0.05 % MT LIQD
7.0000 mL | Freq: Two times a day (BID) | OROMUCOSAL | Status: DC
  Administered 2014-07-22 – 2014-08-02 (×24): 7 mL via OROMUCOSAL

## 2014-07-21 MED ORDER — IPRATROPIUM-ALBUTEROL 0.5-2.5 (3) MG/3ML IN SOLN
3.0000 mL | Freq: Four times a day (QID) | RESPIRATORY_TRACT | Status: DC | PRN
  Administered 2014-07-25 – 2014-07-31 (×2): 3 mL via RESPIRATORY_TRACT
  Filled 2014-07-21 (×2): qty 3

## 2014-07-21 MED ORDER — VANCOMYCIN HCL IN DEXTROSE 1-5 GM/200ML-% IV SOLN
1000.0000 mg | Freq: Once | INTRAVENOUS | Status: AC
  Administered 2014-07-21: 1000 mg via INTRAVENOUS
  Filled 2014-07-21: qty 200

## 2014-07-21 MED ORDER — SODIUM CHLORIDE 0.9 % IV BOLUS (SEPSIS): 1000.0000 mL | Freq: Once | INTRAVENOUS | Status: DC

## 2014-07-21 NOTE — ED Provider Notes (Signed)
TIME SEEN: 12:58 PM  CHIEF COMPLAINT: code sepsis  HPI: Pt is a 80 y.o. F with history of hypertension, COPD, CHF, dementia, CVA who is nonverbal at baseline who presents [facility] the emergency department [facility] with fever, tachycardia, hypoxia, tachypnea. Patient's son reports that yesterday she seemed to be "weaker" than normal but no other apparent distress. He denies that he is aware that she has had any vomiting or diarrhea.  He reports patient recently had a hip fracture that was not repaired surgically because of patient's age, comorbidities.  PCP is Dr Megan MansMcGinnis  ROS: level V caveat for AMS   PAST MEDICAL HISTORY/PAST SURGICAL HISTORY:  Past Medical History  Diagnosis Date  . CVA (cerebral vascular accident)   . Hypertension   . Dementia   . Osteoporosis   . MI (myocardial infarction)   . COPD (chronic obstructive pulmonary disease)   . Systolic HF (heart failure)     5/14    MEDICATIONS:  Prior to Admission medications   Medication Sig Start Date End Date Taking? Authorizing Provider  clopidogrel (PLAVIX) 75 MG tablet Take 75 mg by mouth daily.      Historical Provider, MD  Eyelid Cleansers (OCUSOFT LID SCRUB EX) Apply topically 2 (two) times daily.    Historical Provider, MD  ferrous sulfate 325 (65 FE) MG tablet Take 325 mg by mouth daily with breakfast. Crush and mix with applesauce.    Historical Provider, MD  HYDROcodone-acetaminophen (NORCO/VICODIN) 5-325 MG per tablet Take 1/2 tablet by mouth twice daily as needed for pain 05/26/14   Oneal GroutMahima Pandey, MD  metoprolol succinate (TOPROL-XL) 25 MG 24 hr tablet Take 25 mg by mouth daily.      Historical Provider, MD  Nutritional Supplements (ENSURE PO) Take 1 Can by mouth 3 (three) times daily with meals.    Historical Provider, MD  spironolactone (ALDACTONE) 25 MG tablet Take 12.5 mg by mouth daily.    Historical Provider, MD  tobramycin-dexamethasone Wallene Dales(TOBRADEX) ophthalmic ointment Place 1 application into the left  eye every 6 (six) hours. For 2 weeks stop 05/25/14    Historical Provider, MD    ALLERGIES:  No Known Allergies  SOCIAL HISTORY:  History  Substance Use Topics  . Smoking status: Never Smoker   . Smokeless tobacco: Never Used  . Alcohol Use: No    FAMILY HISTORY: Family History  Problem Relation Age of Onset  . Heart failure Sister   . Heart attack Sister     EXAM: BP 144/60 mmHg  Pulse 132  Temp(Src) 102.6 F (39.2 C) (Rectal)  Resp 35  Ht 5\' 5"  (1.651 m)  Wt 122 lb (55.339 kg)  BMI 20.30 kg/m2  SpO2 87% CONSTITUTIONAL: Alert; elderly, in moderate respiratory distress, nonverbal at baseline; cachectic HEAD: Normocephalic EYES: Conjunctivae clear, PERRL ENT: normal nose; no rhinorrhea; drymucous membranes; pharynx without lesions noted NECK: Supple, no meningismus, no LAD  CARD: regular and tachycardic; S1 and S2 appreciated; no murmurs, no clicks, no rubs, no gallops RESP: Normal chest excursion without splinting; patient is tachypneic and hypoxic, increased work of breathing, moderate respiratory distress, diminished breath sounds on left side, no wheezing or rhonchi ABD/GI: Normal bowel sounds; non-distended; soft, non-tender, no rebound, no guarding BACK:  The back appears normal and is non-tender to palpation, there is no CVA tenderness EXT: Normal ROM in all joints; non-tender to palpation; no edema; slightly delayed capillary refill; no cyanosis; 2+ DP and radial pulses bilaterally    SKIN: Normal color  for age and race; warm; mottled lower extremities NEURO: Moves all extremities equally; nonverbal at baseline, no obvious facial droop, does not follow commands consistently PSYCH: The patient's mood and manner are appropriate. Grooming and personal hygiene are appropriate.  MEDICAL DECISION MAKING: Pt here with sepsis.  She is febrile, tachycardic, tachypneic, hypoxic. Suspect pneumonia. Will give broad-spectrum antibiotics, obtain cultures, chest x-ray, urine,  labs. Patient will need admission. Discuss with patient's son by phone who confirmed that she is a DO NOT RESUSCITATE/DO NOT INTUBATE. He is on his way to the emergency department.  ED PROGRESS: Pt's heart rate, tachypnea and hypoxia have improved. She is still normotensive.   Patient's labs show a leukocytosis of 12.7 with left shift. Her sodium is 169, chloride greater than 130. Troponin negative. Lactate is 7.49. Chest x-ray shows a left-sided pneumonia. Discussed with patient's son who is not bedside he reports he would want further treatment and admission but confirms again she is DO NOT INTUBATE/DO NOT RESUSCITATE.   ED Sepsis - Repeat Assessment   Performed at:    July 21, 2014, 3:06 PM   Last Vitals:    Blood pressure 116/48, pulse 96, temperature 102.6 F (39.2 C), temperature source Rectal, resp. rate 20, height 5\' 5"  (1.651 m), weight 122 lb (55.339 kg), SpO2 93 %.  Heart:      Regular rate and rhythm  Lungs:     Decreased breath sounds on the left side  Capillary Refill:   Slightly diminished  Peripheral Pulse (include location): 2+ radial pulses bilaterally   Skin (include color):   Warm, mottled appearance improving   3:30 PM  D/w Dr. Adrian BlackwaterStinson with hospitalist service for admission to step down, inpatient.  He would like IVF converted to 1/2 NS at 15725ml/hr.   EKG Interpretation  Date/Time:  Friday July 21 2014 12:47:31 EST Ventricular Rate:  128 PR Interval:  107 QRS Duration: 132 QT Interval:  323 QTC Calculation: 471 R Axis:   -46 Text Interpretation:  Sinus tachycardia Ventricular premature complex Left bundle branch block Artifact in lead(s) I II III aVR aVL aVF V1 V2 V3 V4 V5 V6 No significant change since last tracing Confirmed by Lollie Gunner,  DO, Tarini Carrier ([id number]) on 07/21/2014 1:06:26 PM        CRITICAL CARE Performed by: Raelyn NumberWARD, Topacio Cella N   Total critical care time: 45 minutes  Critical care time was exclusive of separately billable procedures and  treating other patients.  Critical care was necessary to treat or prevent imminent or life-threatening deterioration.  Critical care was time spent personally by me on the following activities: development of treatment plan with patient and/or surrogate as well as nursing, discussions with consultants, evaluation of patient's response to treatment, examination of patient, obtaining history from patient or surrogate, ordering and performing treatments and interventions, ordering and review of laboratory studies, ordering and review of radiographic studies, pulse oximetry and re-evaluation of patient's condition.   Layla MawKristen N Yasaman Kolek, DO 07/21/14 1536

## 2014-07-21 NOTE — H&P (Signed)
History and Physical  [name] YQM:[id number] DOB: 1919/08/01 DOA: 07/21/2014  Referring physician: Dr Elesa MassedWard, ED physician PCP: Alice ReichertMCINNIS,ANGUS G, MD   Chief Complaint: fever  HPI: [name] is a 80 y.o. female  With a history of hypertension, COPD, dementia, cerebrovascular accident, left hip fracture, congestive heart failure. [facility]. She was brought to the hospital due to fever, tachycardia, hypoxia, tachypnea. Per the patient's son, he noted some increased weakness and decreased interaction in her yesterday compared to normal. The fever started this morning and progressed to 102 and the patient was brought to the ER. In the ER, noted that she was hypotensive the patient was given a liter bolus and started on empiric antibiotics.   Review of Systems:  Patient with dementia and altered mental status  Past Medical History  Diagnosis Date  . CVA (cerebral vascular accident)   . Hypertension   . Dementia   . Osteoporosis   . MI (myocardial infarction)   . COPD (chronic obstructive pulmonary disease)   . Systolic HF (heart failure)     5/14   History reviewed. No pertinent past surgical history. Social History:  reports that she has never smoked. She has never used smokeless tobacco. She reports that she does not drink alcohol or use illicit drugs. [facility] & is unable to participate in activities of daily living  No Known Allergies  Family History  Problem Relation Age of Onset  . Heart failure Sister   . Heart attack Sister       Prior to Admission medications   Medication Sig Start Date End Date Taking? Authorizing Provider  Amino Acids-Protein Hydrolys (FEEDING SUPPLEMENT, PRO-STAT SUGAR FREE 64,) LIQD Take 30 mLs by mouth 2 (two) times daily. Between meals.   Yes Historical Provider, MD  clopidogrel (PLAVIX) 75 MG tablet Take 75 mg by mouth daily.     Yes Historical Provider, MD  ferrous sulfate 325 (65 FE) MG  tablet Take 325 mg by mouth daily with breakfast. Crush and mix with applesauce.   Yes Historical Provider, MD  HYDROcodone-acetaminophen (NORCO/VICODIN) 5-325 MG per tablet Take 1/2 tablet by mouth twice daily as needed for pain 05/26/14  Yes Mahima Pandey, MD  ipratropium-albuterol (DUONEB) 0.5-2.5 (3) MG/3ML SOLN Take 3 mLs by nebulization every 6 (six) hours as needed (shorntess of breath).   Yes Historical Provider, MD  metoprolol succinate (TOPROL-XL) 25 MG 24 hr tablet Take 25 mg by mouth daily.     Yes Historical Provider, MD  spironolactone (ALDACTONE) 25 MG tablet Take 12.5 mg by mouth daily.   Yes Historical Provider, MD  Eyelid Cleansers (OCUSOFT LID SCRUB EX) Apply topically 2 (two) times daily.    Historical Provider, MD  Nutritional Supplements (ENSURE PO) Take 1 Can by mouth 3 (three) times daily with meals.    Historical Provider, MD  tobramycin-dexamethasone Wallene Dales(TOBRADEX) ophthalmic ointment Place 1 application into the left eye every 6 (six) hours. For 2 weeks stop 05/25/14    Historical Provider, MD    Physical Exam: BP 104/46 mmHg  Pulse 89  Temp(Src) 100.7 F (38.2 C) (Rectal)  Resp 28  Ht 5\' 5"  (1.651 m)  Wt 55.339 kg (122 lb)  BMI 20.30 kg/m2  SpO2 98%  General: elderly Caucasian female. Awake, but minimally responsive. No acute cardiopulmonary distress.  Eyes: Pupils equal, round, reactive to light. Extraocular muscles are intact. Sclerae anicteric and noninjected.  ENT: dry mucosal membranes. No mucosal  lesions. Teeth in poor repair  Neck: Neck supple without lymphadenopathy. No carotid bruits. No masses palpated.  Cardiovascular: Regular rate with normal S1-S2 sounds. No murmurs, rubs, gallops auscultated. No JVD.  Respiratory: tachypneic with poor respiratory effort. Rales on the left base.  Abdomen: Soft, nontender, nondistended. Active bowel sounds. No masses or hepatosplenomegaly  Skin: Dry, warm to touch. 2+ dorsalis pedis and radial pulses. large bruise on  right leg extending to the majority of the lateral aspect of the right lower leg Musculoskeletal: No calf or leg pain. All major joints not erythematous nontender.  Psychiatric: unable to determine Neurologic: unable to determine due to minimally responsive patient.           Labs on Admission:  Basic Metabolic Panel:  Recent Labs Lab 07/21/14 1311  NA 169*  K 3.3*  CL >130*  CO2 22  GLUCOSE 350*  BUN 52*  CREATININE 0.74  CALCIUM 8.7   Liver Function Tests:  Recent Labs Lab 07/21/14 1311  AST 14  ALT 14  ALKPHOS 86  BILITOT 0.3  PROT 6.2  ALBUMIN 1.9*   No results for input(s): LIPASE, AMYLASE in the last 168 hours. No results for input(s): AMMONIA in the last 168 hours. CBC:  Recent Labs Lab 07/21/14 1311  WBC 12.7*  NEUTROABS 10.2*  HGB 10.9*  HCT 38.6  MCV 100.0  PLT 270   Cardiac Enzymes:  Recent Labs Lab 07/21/14 1311  TROPONINI <0.30    BNP (last 3 results)  Recent Labs  07/21/14 1311  PROBNP 3855.0*   CBG: No results for input(s): GLUCAP in the last 168 hours.  Radiological Exams on Admission: Dg Chest Port 1 View  07/21/2014   CLINICAL DATA:  80 year old female with tachycardia and fever  EXAM: PORTABLE CHEST - 1 VIEW  COMPARISON:  Prior chest x-ray 05/27/2014  FINDINGS: Cardiac and mediastinal contours remain unchanged. Atherosclerotic calcifications are present within the transverse aorta. Increasing left basilar opacity now obscures the hemidiaphragm. Similar interstitial prominence and central bronchitic changes. No acute osseous abnormality. No pneumothorax.  IMPRESSION: 1. Increasing left basilar opacity now obscures the left hemidiaphragm. Differential considerations include pleural effusion with atelectasis and left lower lobe pneumonia. 2. Stable mild cardiomegaly. 3. Aortic atherosclerosis.   Electronically Signed   By: Malachy MoanHeath  McCullough M.D.   On: 07/21/2014 13:40    EKG: Independently reviewed. Sinus tachycardia with a rate  of 128 with a left bundle branch block. Multiple PVCs seen. No acute ST changes  Assessment/Plan Present on Admission:  . Sepsis . Hypernatremia . HCAP (healthcare-associated pneumonia) . Encephalopathy . Dehydration . Hyperglycemia  #1 sepsis We'll continue to treat the primary problem of pneumonia with antibiotics. Continue with fluid resuscitation. Hold antihypertensives. Admit the patient to stepdown unit  #2 healthcare associated pneumonia We'll continue broad-spectrum antibiotics of Zosyn and vancomycin. We'll obtain MRSA PCR. If negative will discontinue vancomycin.  #3 encephalopathy Related to sepsis and hypernatremia  #4 hypernatremia Likely acute on chronic. The patient is likely severely dehydrated. Will cautiously replace the patient's free water with D5W and half normal saline at a rate of 70 mL's per hour. Recheck BMP now and in 6 hours and in the morning. As the patient has a history of congestive heart failure, will rehydrate cautiously. Additionally, the patient has hyperglycemia, therefore will mix the D5W with half-normal saline and attempts to not exacerbate the hyperglycemia.  #5 dehydration - secondary to #1  #6 hyperglycemia We'll be cautious with D5W. Additionally, will start on  insulin subcutaneous every 6 hours  #7 COPD No acute wheezing. DuoNeb as needed  #8 hypertension Patient on metoprolol at home. Will hold given the patient's current hypotensive state  DVT prophylaxis: Lovenox  Consultants: none  Code Status: DO NOT RESUSCITATE  Family Communication: son - Michaiah Maiden - cell phone number [phone]   Disposition Plan: pending  Time spent: 90 minutes  Candelaria Celeste, DO [facility] Pager [phone]

## 2014-07-21 NOTE — Progress Notes (Addendum)
Lab called with critical sodium of 165 mEq/L. -Mid-level Lynch paged  Awaiting orders.

## 2014-07-21 NOTE — ED Notes (Signed)
CRITICAL VALUE ALERT  Critical value received:  Sodium - 169, Chloride > 130  Date of notification:  07/21/2014  Time of notification:  1453  Critical value read back: yes  Nurse who received alert:  LJS  MD notified (1st page):  Dr Elesa MassedWard  Time of first page:  1453  MD notified (2nd page):  Time of second page:  Responding MD:  Dr Elesa MassedWard  Time MD responded:  [phone]

## 2014-07-21 NOTE — ED Notes (Signed)
Patient from [facility] with O2 saturation 80%, tachycardia, fever of 101.8 axillary, and tachypnea. Patient arrives on 4L via nasal cannula with O2 saturation 89. Patient non-responsive verbally, this is patient's baseline per [facility].

## 2014-07-21 NOTE — Progress Notes (Signed)
ANTIBIOTIC CONSULT NOTE  Pharmacy Consult for Vancomycin & Zosyn Indication: rule out sepsis  No Known Allergies  Patient Measurements: Height: 5\' 5"  (165.1 cm) Weight: 122 lb (55.339 kg) IBW/kg (Calculated) : 57  Vital Signs: Temp: 100.7 F (38.2 C) (11/13 1518) Temp Source: Rectal (11/13 1518) BP: 104/46 mmHg (11/13 1530) Pulse Rate: 89 (11/13 1545) Intake/Output from previous day:   Intake/Output from this shift:    Labs:  Recent Labs  07/21/14 1311  WBC 12.7*  HGB 10.9*  PLT 270  CREATININE 0.74   Estimated Creatinine Clearance: 38.4 mL/min (by C-G formula based on Cr of 0.74). No results for input(s): VANCOTROUGH, VANCOPEAK, VANCORANDOM, GENTTROUGH, GENTPEAK, GENTRANDOM, TOBRATROUGH, TOBRAPEAK, TOBRARND, AMIKACINPEAK, AMIKACINTROU, AMIKACIN in the last 72 hours.   Microbiology: No results found for this or any previous visit (from the past 720 hour(s)).  Anti-infectives    Start     Dose/Rate Route Frequency Ordered Stop   07/21/14 1430  piperacillin-tazobactam (ZOSYN) IVPB 3.375 g     3.375 g100 mL/hr over 30 Minutes Intravenous  Once 07/21/14 1422     07/21/14 1300  piperacillin-tazobactam (ZOSYN) IVPB 3.375 g     3.375 g100 mL/hr over 30 Minutes Intravenous  Once 07/21/14 1259 07/21/14 1407   07/21/14 1300  vancomycin (VANCOCIN) IVPB 1000 mg/200 mL premix     1,000 mg200 mL/hr over 60 Minutes Intravenous  Once 07/21/14 1259 07/21/14 1519      Assessment: [name], tachycardia, hypoxia, tachypnea.  WBC & lactic acid levels are elevated.   CXR + possible LLL PNA.  Blood cx pending. Renal function is at patient's baseline.   Vancomycin 11/13>> Zosyn 11/13>>   Goal of Therapy:  Vancomycin trough level 15-20 mcg/ml  Plan:  Zosyn 3.375gm IV Q8h to be infused over 4hrs Vancomycin 750mg  IV q24h Check Vancomycin trough at steady state Monitor renal function and cx data   [name], Jojo Geving Michelle 07/21/2014,4:02 PM

## 2014-07-21 NOTE — Progress Notes (Signed)
CRITICAL VALUE ALERT  Critical value received:  Na 166  Date of notification:  07/21/14  Time of notification:  1700  Critical value read back:Yes.    Nurse who received alert:  N.Ohana Birdwell,RN  MD notified (1st page):  [place]  Time of first page:  1705  MD notified (2nd page):  Time of second page:  Responding MD:    Time MD responded:    No new orders.

## 2014-07-22 LAB — GLUCOSE, CAPILLARY
GLUCOSE-CAPILLARY: 155 mg/dL — AB (ref 70–99)
Glucose-Capillary: 165 mg/dL — ABNORMAL HIGH (ref 70–99)
Glucose-Capillary: 143 mg/dL — ABNORMAL HIGH (ref 70–99)
Glucose-Capillary: 126 mg/dL — ABNORMAL HIGH (ref 70–99)
Glucose-Capillary: 162 mg/dL — ABNORMAL HIGH (ref 70–99)

## 2014-07-22 LAB — BASIC METABOLIC PANEL
Anion gap: 10 (ref 5–15)
BUN: 46 mg/dL — ABNORMAL HIGH (ref 6–23)
CO2: 27 mEq/L (ref 19–32)
Calcium: 8.3 mg/dL — ABNORMAL LOW (ref 8.4–10.5)
Chloride: 129 mEq/L — ABNORMAL HIGH (ref 96–112)
Creatinine, Ser: 0.65 mg/dL (ref 0.50–1.10)
GFR calc Af Amer: 86 mL/min — ABNORMAL LOW (ref >90)
GFR calc non Af Amer: 74 mL/min — ABNORMAL LOW (ref >90)
Glucose, Bld: 173 mg/dL — ABNORMAL HIGH (ref 70–99)
Potassium: 3.5 mEq/L — ABNORMAL LOW (ref 3.7–5.3)
Sodium: 166 mEq/L (ref 137–147)

## 2014-07-22 LAB — PHOSPHORUS: PHOSPHORUS: 2.4 mg/dL (ref 2.3–4.6)

## 2014-07-22 LAB — MAGNESIUM: Magnesium: 2.2 mg/dL (ref 1.5–2.5)

## 2014-07-22 MED ORDER — DEXTROSE-NACL 5-0.45 % IV SOLN
INTRAVENOUS | Status: DC
  Administered 2014-07-22 – 2014-07-28 (×10): via INTRAVENOUS

## 2014-07-22 NOTE — Plan of Care (Signed)
Problem: Phase I Progression Outcomes Goal: Pain controlled with appropriate interventions Outcome: Progressing Goal: Voiding-avoid urinary catheter unless indicated Outcome: Progressing     

## 2014-07-22 NOTE — Plan of Care (Signed)
Problem: Phase I Progression Outcomes Goal: Pain controlled with appropriate interventions Outcome: Progressing Goal: OOB as tolerated unless otherwise ordered Outcome: Progressing Goal: Voiding-avoid urinary catheter unless indicated Outcome: Progressing Goal: Hemodynamically stable Outcome: Progressing     

## 2014-07-22 NOTE — Progress Notes (Signed)
Subjective: The patient is minimally responsive. Being treated for sepsis healthcare associated pneumonia and encephalopathyshe has by x-ray left lower lobe pneumonia  Objective: Vital signs in last 24 hours: Temp:  [96.7 F (35.9 C)-102.6 F (39.2 C)] 96.7 F (35.9 C) (11/14 0803) Pulse Rate:  [68-132] 74 (11/14 0700) Resp:  [15-35] 21 (11/14 0700) BP: (94-144)/(34-60) 107/51 mmHg (11/14 0700) SpO2:  [87 %-100 %] 96 % (11/14 0700) Weight:  [50 kg (110 lb 3.7 oz)-55.339 kg (122 lb)] 50 kg (110 lb 3.7 oz) (11/14 0500) Weight change:  Last BM Date: 07/21/14  Intake/Output from previous day: 11/13 0701 - 11/14 0700 In: -  Out: 400 [Urine:400] Intake/Output this shift:    Physical Exam: General appearance the patient appears awake but responds minimally  HEENT negative  Neck supple no JVD or thyroid abnormalities  Heart regular rhythm no murmurs  Lungs rales or rhonchi left lower base  Abdomen no palpable organs or masses  Skin warm and dry bruise on right lower leg   Recent Labs  07/21/14 1311  WBC 12.7*  HGB 10.9*  HCT 38.6  PLT 270   BMET  Recent Labs  07/21/14 2145 07/22/14 0551  NA 165* 166*  K 3.7 3.5*  CL 128* 129*  CO2 26 27  GLUCOSE 256* 173*  BUN 49* 46*  CREATININE 0.67 0.65  CALCIUM 8.5 8.3*    Studies/Results: Dg Chest Port 1 View  07/21/2014   CLINICAL DATA:  80 year old female with tachycardia and fever  EXAM: PORTABLE CHEST - 1 VIEW  COMPARISON:  Prior chest x-ray 05/27/2014  FINDINGS: Cardiac and mediastinal contours remain unchanged. Atherosclerotic calcifications are present within the transverse aorta. Increasing left basilar opacity now obscures the hemidiaphragm. Similar interstitial prominence and central bronchitic changes. No acute osseous abnormality. No pneumothorax.  IMPRESSION: 1. Increasing left basilar opacity now obscures the left hemidiaphragm. Differential considerations include pleural effusion with atelectasis and left  lower lobe pneumonia. 2. Stable mild cardiomegaly. 3. Aortic atherosclerosis.   Electronically Signed   By: Malachy MoanHeath  McCullough M.D.   On: 07/21/2014 13:40    Medications:  . antiseptic oral rinse  7 mL Mouth Rinse q12n4p  . chlorhexidine  15 mL Mouth Rinse BID  . enoxaparin (LOVENOX) injection  40 mg Subcutaneous Q24H  . insulin aspart  0-9 Units Subcutaneous TID WC  . pantoprazole (PROTONIX) IV  40 mg Intravenous QHS  . piperacillin-tazobactam (ZOSYN)  IV  3.375 g Intravenous Q8H  . vancomycin  750 mg Intravenous Q24H    . sodium chloride 35 mL/hr at 07/21/14 1709  . dextrose 35 mL/hr at 07/21/14 1831     Assessment/Plan: 1. Bronchopneumonia left lower lobe healthcare associated pneumonia to continue current antibiotics vancomycin and Zosyn  2. Encephalopathy  3. Dehydration to rehydrate continue IV fluids patient has also hypernatremia we'll continue to monitor chemistries     LOS: 1 day   [name] G 07/22/2014, 8:22 AM

## 2014-07-23 LAB — BASIC METABOLIC PANEL
Anion gap: 10 (ref 5–15)
BUN: 31 mg/dL — ABNORMAL HIGH (ref 6–23)
CO2: 26 mEq/L (ref 19–32)
Calcium: 8.2 mg/dL — ABNORMAL LOW (ref 8.4–10.5)
Chloride: 126 mEq/L — ABNORMAL HIGH (ref 96–112)
Creatinine, Ser: 0.63 mg/dL (ref 0.50–1.10)
GFR calc non Af Amer: 75 mL/min — ABNORMAL LOW (ref >90)
GFR, EST AFRICAN AMERICAN: 87 mL/min — AB (ref >90)
GLUCOSE: 199 mg/dL — AB (ref 70–99)
POTASSIUM: 3.2 meq/L — AB (ref 3.7–5.3)
SODIUM: 162 meq/L — AB (ref 137–147)

## 2014-07-23 LAB — GLUCOSE, CAPILLARY
GLUCOSE-CAPILLARY: 188 mg/dL — AB (ref 70–99)
Glucose-Capillary: 140 mg/dL — ABNORMAL HIGH (ref 70–99)
Glucose-Capillary: 158 mg/dL — ABNORMAL HIGH (ref 70–99)
Glucose-Capillary: 163 mg/dL — ABNORMAL HIGH (ref 70–99)
Glucose-Capillary: 164 mg/dL — ABNORMAL HIGH (ref 70–99)
Glucose-Capillary: 181 mg/dL — ABNORMAL HIGH (ref 70–99)

## 2014-07-23 LAB — URINE CULTURE
Colony Count: NO GROWTH
Culture: NO GROWTH

## 2014-07-23 NOTE — Progress Notes (Signed)
Subjective: The patient is responsive to painful stimulus. She's been treated for sepsis healthcare associated pneumonia encephalopathy. She has left lower lobe pneumonia. Her vital signs remained stable  Objective: Vital signs in last 24 hours: Temp:  [96.7 F (35.9 C)-97.6 F (36.4 C)] 97.6 F (36.4 C) (11/15 0400) Pulse Rate:  [61-80] 67 (11/15 0700) Resp:  [12-24] 16 (11/15 0700) BP: (92-119)/(31-63) 100/42 mmHg (11/15 0700) SpO2:  [95 %-100 %] 98 % (11/15 0700) Weight:  [50.1 kg (110 lb 7.2 oz)] 50.1 kg (110 lb 7.2 oz) (11/15 0500) Weight change: -5.239 kg (-11 lb 8.8 oz) Last BM Date: 07/21/14  Intake/Output from previous day: 11/14 0701 - 11/15 0700 In: 1800 [I.V.:1400; IV Piggyback:400] Out: 750 [Urine:750] Intake/Output this shift:    Physical Exam: Gen. Appearance-the patient appears lethargic  HEENT negative  Neck supple no JVD or thyroid abnormalities  Heart regular rhythm no murmurs  Lungs Rales and rhonchi left lower base  Abdomen no palpable organs or masses  Skin warm and dry bruise of right lower leg   Recent Labs  07/21/14 1311  WBC 12.7*  HGB 10.9*  HCT 38.6  PLT 270   BMET  Recent Labs  07/21/14 2145 07/22/14 0551  NA 165* 166*  K 3.7 3.5*  CL 128* 129*  CO2 26 27  GLUCOSE 256* 173*  BUN 49* 46*  CREATININE 0.67 0.65  CALCIUM 8.5 8.3*    Studies/Results: Dg Chest Port 1 View  07/21/2014   CLINICAL DATA:  80 year old female with tachycardia and fever  EXAM: PORTABLE CHEST - 1 VIEW  COMPARISON:  Prior chest x-ray 05/27/2014  FINDINGS: Cardiac and mediastinal contours remain unchanged. Atherosclerotic calcifications are present within the transverse aorta. Increasing left basilar opacity now obscures the hemidiaphragm. Similar interstitial prominence and central bronchitic changes. No acute osseous abnormality. No pneumothorax.  IMPRESSION: 1. Increasing left basilar opacity now obscures the left hemidiaphragm. Differential  considerations include pleural effusion with atelectasis and left lower lobe pneumonia. 2. Stable mild cardiomegaly. 3. Aortic atherosclerosis.   Electronically Signed   By: Malachy MoanHeath  McCullough M.D.   On: 07/21/2014 13:40    Medications:  . antiseptic oral rinse  7 mL Mouth Rinse q12n4p  . chlorhexidine  15 mL Mouth Rinse BID  . enoxaparin (LOVENOX) injection  40 mg Subcutaneous Q24H  . insulin aspart  0-9 Units Subcutaneous TID WC  . pantoprazole (PROTONIX) IV  40 mg Intravenous QHS  . piperacillin-tazobactam (ZOSYN)  IV  3.375 g Intravenous Q8H  . vancomycin  750 mg Intravenous Q24H    . dextrose 5 % and 0.45% NaCl 70 mL/hr at 07/22/14 1946     Assessment/Plan: 1. Bronchopneumonia left lower lobe healthcare associated pneumonia. Plan to continue current antibiotic vancomycin and Zosyn  2.dementia altered mental status and previous CVA-continue supportive care . Prior hip fracture     LOS: 2 days   Deandrew Hoecker G 07/23/2014, 7:22 AM

## 2014-07-23 NOTE — Progress Notes (Signed)
Utilization review Completed Chemeka Filice RN BSN   

## 2014-07-23 NOTE — Progress Notes (Signed)
Pharmacist Heart Failure Core Measure Documentation  Assessment: [name] has an EF documented as 35-40% on 01/18/13 by ECHO.  Rationale: Heart failure patients with left ventricular systolic dysfunction (LVSD) and an EF < 40% should be prescribed an angiotensin converting enzyme inhibitor (ACEI) or angiotensin receptor blocker (ARB) at discharge unless a contraindication is documented in the medical record.  This patient is not currently on an ACEI or ARB for HF.  This note is being placed in the record in order to provide documentation that a contraindication to the use of these agents is present for this encounter.  ACE Inhibitor or Angiotensin Receptor Blocker is contraindicated (specify all that apply)  []   ACEI allergy AND ARB allergy []   Angioedema []   Moderate or severe aortic stenosis []   Hyperkalemia []   Hypotension []   Renal artery stenosis [x]   Worsening renal function, preexisting renal disease or dysfunction   [name], [name] 07/23/2014 12:50 PM

## 2014-07-24 LAB — GLUCOSE, CAPILLARY
Glucose-Capillary: 126 mg/dL — ABNORMAL HIGH (ref 70–99)
Glucose-Capillary: 148 mg/dL — ABNORMAL HIGH (ref 70–99)
Glucose-Capillary: 148 mg/dL — ABNORMAL HIGH (ref 70–99)
Glucose-Capillary: 163 mg/dL — ABNORMAL HIGH (ref 70–99)
Glucose-Capillary: 197 mg/dL — ABNORMAL HIGH (ref 70–99)
Glucose-Capillary: 197 mg/dL — ABNORMAL HIGH (ref 70–99)

## 2014-07-24 LAB — BASIC METABOLIC PANEL
Anion gap: 11 (ref 5–15)
BUN: 23 mg/dL (ref 6–23)
CHLORIDE: 124 meq/L — AB (ref 96–112)
CO2: 22 meq/L (ref 19–32)
Calcium: 8 mg/dL — ABNORMAL LOW (ref 8.4–10.5)
Creatinine, Ser: 0.58 mg/dL (ref 0.50–1.10)
GFR calc non Af Amer: 77 mL/min — ABNORMAL LOW (ref >90)
GFR, EST AFRICAN AMERICAN: 89 mL/min — AB (ref >90)
Glucose, Bld: 197 mg/dL — ABNORMAL HIGH (ref 70–99)
POTASSIUM: 2.8 meq/L — AB (ref 3.7–5.3)
Sodium: 157 mEq/L — ABNORMAL HIGH (ref 137–147)

## 2014-07-24 MED ORDER — POTASSIUM CHLORIDE 10 MEQ/100ML IV SOLN
10.0000 meq | INTRAVENOUS | Status: AC
  Administered 2014-07-24 (×3): 10 meq via INTRAVENOUS
  Filled 2014-07-24 (×4): qty 100

## 2014-07-24 MED ORDER — INSULIN ASPART 100 UNIT/ML ~~LOC~~ SOLN
0.0000 [IU] | SUBCUTANEOUS | Status: DC
  Administered 2014-07-25 (×2): 2 [IU] via SUBCUTANEOUS
  Administered 2014-07-25 (×3): 1 [IU] via SUBCUTANEOUS
  Administered 2014-07-26: 2 [IU] via SUBCUTANEOUS
  Administered 2014-07-26 – 2014-07-27 (×6): 1 [IU] via SUBCUTANEOUS
  Administered 2014-07-28: 2 [IU] via SUBCUTANEOUS
  Administered 2014-07-28: 1 [IU] via SUBCUTANEOUS
  Administered 2014-07-28: 2 [IU] via SUBCUTANEOUS
  Administered 2014-07-28 – 2014-07-29 (×5): 1 [IU] via SUBCUTANEOUS
  Administered 2014-07-29: 2 [IU] via SUBCUTANEOUS
  Administered 2014-07-29 – 2014-07-31 (×8): 1 [IU] via SUBCUTANEOUS
  Administered 2014-07-31 (×2): 2 [IU] via SUBCUTANEOUS
  Administered 2014-08-01 (×2): 1 [IU] via SUBCUTANEOUS
  Administered 2014-08-01: 2 [IU] via SUBCUTANEOUS
  Administered 2014-08-01 – 2014-08-02 (×2): 1 [IU] via SUBCUTANEOUS
  Administered 2014-08-02: 2 [IU] via SUBCUTANEOUS
  Administered 2014-08-02: 5 [IU] via SUBCUTANEOUS
  Administered 2014-08-02 (×2): 1 [IU] via SUBCUTANEOUS
  Administered 2014-08-03: 2 [IU] via SUBCUTANEOUS

## 2014-07-24 NOTE — Progress Notes (Signed)
Subjective: The patient is more responsive. She is being treated for sepsis healthcare associated pneumonia encephalopathy. She has left lower lobe pneumonia. Her vital signs remained stable. She does have low serum potassium.  Objective: Vital signs in last 24 hours: Temp:  [96.4 F (35.8 C)-97.1 F (36.2 C)] 97 F (36.1 C) (11/16 0400) Pulse Rate:  [66-91] 76 (11/16 0600) Resp:  [1-28] 15 (11/16 0600) BP: (100-145)/(38-92) 113/42 mmHg (11/16 0600) SpO2:  [82 %-100 %] 90 % (11/16 0600) FiO2 (%):  [40 %] 40 % (11/16 0556) Weight:  [51.8 kg (114 lb 3.2 oz)] 51.8 kg (114 lb 3.2 oz) (11/16 0500) Weight change: 1.7 kg (3 lb 12 oz) Last BM Date: 07/21/14  Intake/Output from previous day: 11/15 0701 - 11/16 0700 In: 1980 [I.V.:1680; IV Piggyback:300] Out: 550 [Urine:550] Intake/Output this shift: Total I/O In: 870 [I.V.:770; IV Piggyback:100] Out: 250 [Urine:250]  Physical Exam: Gen. Appearance-the patient is lethargic  HEENT negative  Neck supple no JVD or thyroid abnormalities  Heart regular rhythm no murmurs  Lungs Rales and rhonchi left lower base  Abdomen no palpable organs or masses  Skin warm and dry bruise on right lower leg    Recent Labs  07/21/14 1311  WBC 12.7*  HGB 10.9*  HCT 38.6  PLT 270   BMET  Recent Labs  07/22/14 0551 07/23/14 0615  NA 166* 162*  K 3.5* 3.2*  CL 129* 126*  CO2 27 26  GLUCOSE 173* 199*  BUN 46* 31*  CREATININE 0.65 0.63  CALCIUM 8.3* 8.2*    Studies/Results: No results found.  Medications:  . antiseptic oral rinse  7 mL Mouth Rinse q12n4p  . chlorhexidine  15 mL Mouth Rinse BID  . enoxaparin (LOVENOX) injection  40 mg Subcutaneous Q24H  . insulin aspart  0-9 Units Subcutaneous TID WC  . pantoprazole (PROTONIX) IV  40 mg Intravenous QHS  . piperacillin-tazobactam (ZOSYN)  IV  3.375 g Intravenous Q8H  . vancomycin  750 mg Intravenous Q24H    . dextrose 5 % and 0.45% NaCl 70 mL/hr at 07/24/14 0600      Assessment/Plan: 1. Bronchopneumonia left lower lobe healthcare associated pneumonia-plan to continue current antibiotic regimen vancomycin and Zosyn  2. Dementia altered mental status and previous CVA-continue supportive care hip fracture   LOS: 3 days   Jyllian Haynie G 07/24/2014, 6:20 AM

## 2014-07-24 NOTE — Progress Notes (Signed)
Potassium 2.8 Order received from Dr. Renard MatterMcInnis for 3 runs 10meq each IV

## 2014-07-24 NOTE — Clinical Documentation Improvement (Signed)
Possible Clinical Conditions? Acute Respiratory Failure Acute on Chronic Respiratory Failure Chronic Respiratory Failure Other Condition Cannot Clinically Determine   Supporting Information:(As per notes) "SpO2 87%" , "Alert; elderly, in moderate respiratory distress" Risk Factors:Sepsis, PNA, COPD Signs & Symptoms":Pt here with sepsis. She is febrile, tachycardic, tachypneic, hypoxic."  Diagnostics: CXR 07-21-14 IMPRESSION: 1. Increasing left basilar opacity now obscures the left hemidiaphragm. Differential considerations include pleural effusion with atelectasis and left lower lobe pneumonia.  Treatment: O2 @ 4l/m via [place], then on 07-23-14 SpO2 dropped to 82% and venturi mask at 40% was placed.  Thank You, Nevin BloodgoodJoan B Melainie Krinsky, RN, BSN, CCDS,Clinical Documentation Specialist:  [phone]  [phone]=[facility]- Health Information Management

## 2014-07-24 NOTE — Progress Notes (Signed)
Inpatient Diabetes Program Recommendations  AACE/ADA: New Consensus Statement on Inpatient Glycemic Control (2013)  Target Ranges:  Prepandial:   less than 140 mg/dL      Peak postprandial:   less than 180 mg/dL (1-2 hours)      Critically ill patients:  140 - 180 mg/dL   Results for Felipa EvenerLOVELACE, Amika P (MRN [id number]) as of 07/24/2014 08:45  Ref. Range 07/23/2014 07:55 07/23/2014 11:50 07/23/2014 16:24 07/23/2014 20:34 07/23/2014 23:48 07/24/2014 04:06 07/24/2014 07:32  Glucose-Capillary Latest Range: 70-99 mg/dL 409188 (H) 811164 (H) 914163 (H) 140 (H) 148 (H) 197 (H) 197 (H)   Diabetes history: No Outpatient Diabetes medications: NA Current orders for Inpatient glycemic control: [facility]  Inpatient Diabetes Program Recommendations Insulin - Basal: May want to consider ordering low dose basal insulin; recommend starting with Levemir 5 units QHS. Correction (SSI): Please consider changing frequency of CBGs and Novolog correction to Q4H while NPO. HgbA1C: Last A1C in the chart was 6.1% on 06/26/2006. Initial  glucose was 350 mg/dl. Please consider ordering an A1C to evaluate glycemic control over the past 2-3 months.  Thanks, Orlando PennerMarie Shatyra Becka, RN, MSN, CCRN, CDE Diabetes Coordinator Inpatient Diabetes Program [phone] (Team Pager) [phone] ([facility] office) [phone] [facility] office)

## 2014-07-24 NOTE — Clinical Social Work Psychosocial (Signed)
Clinical Social Work Department BRIEF PSYCHOSOCIAL ASSESSMENT 07/24/2014  Patient:  [name], [name]     Account Number:  [id number]     Admit date:  07/21/2014  Clinical Social Worker:  Legrand Como  Date/Time:  07/24/2014 12:54 PM  Referred by:  CSW  Date Referred:  07/24/2014 Referred for  SNF Placement   Other Referral:   Interview type:  Patient Other interview type:   Keri @ [facility], son    PSYCHOSOCIAL DATA Living Status:  FACILITY Admitted from facility:  [facility] Level of care:  [place] Primary support name:  Aelyn Stanaland Primary support relationship to patient:  CHILD, ADULT Degree of support available:   Family is very supportive    CURRENT CONCERNS Current Concerns  Post-Acute Placement   Other Concerns:    SOCIAL WORK ASSESSMENT / PLAN CSW met with patient. Patient was able to participate in assessment and did not acknowledge CSW.   CSW spoke with patient's son, Marlie Kuennen. Mr. Trias indicated that patient has been a resident at [facility] since September. He indicated that prior to that patient was a resident at [facility] beginning in December, 2007. Mr. Huyett indicated that while residing at [facility], patient ambulated/transfered with very minimal assistance. He indicated that patient fell and broke her hip which lead to her being placed in [facility].  Mr. Wahab indicated that his mother now uses a wheelchair.  He stated that he visits with patient on a daily basis.  Mr. Searles indicated that he wanted patient to return to [facility] upon discharge  CSW spoke with Keri at [facility].  Keri confirmed patient's son's statements.  She indicated that patient was a total assist. Keri indicated that no FL2 would be needed if patient was returning as private pay but if she was returning under her insurance a FL2 would be needed.  Keri indicated that patient could return to facility upon discharge.    Assessment/plan status:  Information/Referral to [employer] Other assessment/ plan:   Information/referral to community resources:    PATIENT'S/FAMILY'S RESPONSE TO PLAN OF CARE: Patient's son agreeable to patient returning to [facility] upon discharge.    Ambrose Pancoast, [place]

## 2014-07-24 NOTE — Plan of Care (Signed)
Problem: Phase I Progression Outcomes Goal: Pain controlled with appropriate interventions Outcome: Progressing Goal: IV fluids as ordered Outcome: Completed/Met Date Met:  07/24/14 Goal: Voiding-avoid urinary catheter unless indicated Outcome: Not Progressing Goal: Antibiotics started within 4 hours of arrival Outcome: Completed/Met Date Met:  07/24/14 Goal: Cardiac Respiratory Monitor & Continuous Pulse Ox Outcome: Completed/Met Date Met:  07/24/14 Goal: Initial discharge plan identified Outcome: Completed/Met Date Met:  07/24/14

## 2014-07-24 NOTE — Progress Notes (Signed)
ANTIBIOTIC CONSULT NOTE  Pharmacy Consult for Vancomycin & Zosyn Indication: PNA  No Known Allergies  Patient Measurements: Height: 5\' 5"  (165.1 cm) Weight: 114 lb 3.2 oz (51.8 kg) IBW/kg (Calculated) : 57  Vital Signs: Temp: 97.6 F (36.4 C) (11/16 0801) Temp Source: Axillary (11/16 0801) BP: 113/42 mmHg (11/16 0600) Pulse Rate: 76 (11/16 0600) Intake/Output from previous day: 11/15 0701 - 11/16 0700 In: 1980 [I.V.:1680; IV Piggyback:300] Out: 550 [Urine:550] Intake/Output from this shift:    Labs:  Recent Labs  07/21/14 1311  07/22/14 0551 07/23/14 0615 07/24/14 0530  WBC 12.7*  --   --   --   --   HGB 10.9*  --   --   --   --   PLT 270  --   --   --   --   CREATININE 0.74  < > 0.65 0.63 0.58  < > = values in this interval not displayed. Estimated Creatinine Clearance: 35.9 mL/min (by C-G formula based on Cr of 0.58). No results for input(s): VANCOTROUGH, VANCOPEAK, VANCORANDOM, GENTTROUGH, GENTPEAK, GENTRANDOM, TOBRATROUGH, TOBRAPEAK, TOBRARND, AMIKACINPEAK, AMIKACINTROU, AMIKACIN in the last 72 hours.   Microbiology: Recent Results (from the past 720 hour(s))  Blood Culture (routine x 2)     Status: None (Preliminary result)   Collection Time: 07/21/14  1:11 PM  Result Value Ref Range Status   Specimen Description BLOOD RIGHT ARM  Final   Special Requests BOTTLES DRAWN AEROBIC AND ANAEROBIC 4CC  Final   Culture NO GROWTH 2 DAYS  Final   Report Status PENDING  Incomplete  Blood Culture (routine x 2)     Status: None (Preliminary result)   Collection Time: 07/21/14  1:15 PM  Result Value Ref Range Status   Specimen Description BLOOD LEFT WRIST  Final   Special Requests BOTTLES DRAWN AEROBIC ONLY 2CC  Final   Culture NO GROWTH 2 DAYS  Final   Report Status PENDING  Incomplete  Urine culture     Status: None   Collection Time: 07/21/14  3:16 PM  Result Value Ref Range Status   Specimen Description URINE, CATHETERIZED  Final   Special Requests NONE  Final    Culture  Setup Time   Final    07/22/2014 00:31 Performed at [facility]    Colony Count NO GROWTH Performed at [facility]   Final   Culture NO GROWTH Performed at [facility]   Final   Report Status 07/23/2014 FINAL  Final  MRSA PCR Screening     Status: None   Collection Time: 07/21/14  5:01 PM  Result Value Ref Range Status   MRSA by PCR NEGATIVE NEGATIVE Final    Comment:        The GeneXpert MRSA Assay (FDA approved for NASAL specimens only), is one component of a comprehensive MRSA colonization surveillance program. It is not intended to diagnose MRSA infection nor to guide or monitor treatment for MRSA infections.     Anti-infectives    Start     Dose/Rate Route Frequency Ordered Stop   07/22/14 1400  vancomycin (VANCOCIN) IVPB 750 mg/150 ml premix     750 mg150 mL/hr over 60 Minutes Intravenous Every 24 hours 07/21/14 1701     07/21/14 2000  piperacillin-tazobactam (ZOSYN) IVPB 3.375 g     3.375 g12.5 mL/hr over 240 Minutes Intravenous Every 8 hours 07/21/14 1701     07/21/14 1430  piperacillin-tazobactam (ZOSYN) IVPB 3.375 g  Status:  Discontinued  3.375 g100 mL/hr over 30 Minutes Intravenous  Once 07/21/14 1422 07/21/14 1607   07/21/14 1300  piperacillin-tazobactam (ZOSYN) IVPB 3.375 g     3.375 g100 mL/hr over 30 Minutes Intravenous  Once 07/21/14 1259 07/21/14 1407   07/21/14 1300  vancomycin (VANCOCIN) IVPB 1000 mg/200 mL premix     1,000 mg200 mL/hr over 60 Minutes Intravenous  Once 07/21/14 1259 07/21/14 1519      Assessment: [name], tachycardia, hypoxia, tachypnea.  WBC & lactic acid levels were elevated on admission, but have normalized.     CXR + possible LLL PNA.  Cx data NGTD. Renal function remains at patient's baseline.   Vancomycin 11/13>> Zosyn 11/13>>   Goal of Therapy:  Vancomycin trough level 15-20 mcg/ml  Plan:  Continue Zosyn 3.375gm IV Q8h to be infused over  4hrs Continue Vancomycin 750mg  IV q24h Check Vancomycin trough at steady state Monitor renal function and cx data  Duration of therapy per MD- consider de-escalate antibiotic therapy to Levaquin to complete total of 7-10 days of antibiotics.  [name], [name] 07/24/2014,8:42 AM

## 2014-07-24 NOTE — Care Management Note (Addendum)
    Page 1 of 1   08/02/2014     11:27:18 AM CARE MANAGEMENT NOTE 08/02/2014  Patient:  [name],[name]   Account Number:  [id number]  Date Initiated:  07/24/2014  Documentation initiated by:  Kathyrn SheriffHILDRESS,JESSICA  Subjective/Objective Assessment:   [facility]. Not able to arouse pt for assessment.     Action/Plan:   [facility]. No CM needs identified at this time.   Anticipated DC Date:  07/27/2014   Anticipated DC Plan:  SKILLED NURSING FACILITY      DC Planning Services  CM consult      Choice offered to / List presented to:             Status of service:  Completed, signed off Medicare Important Message given?  YES (If response is "NO", the following Medicare IM given date fields will be blank) Date Medicare IM given:  07/28/2014 Medicare IM given by:  Arlyss QueenBLACKWELL,Taiesha Bovard C Date Additional Medicare IM given:  08/02/2014 Additional Medicare IM given by:  Sharrie RothmanAMMY C Nasario Czerniak  Discharge Disposition:  SKILLED NURSING FACILITY  Per UR Regulation:    If discussed at Long Length of Stay Meetings, dates discussed:   07/27/2014  08/01/2014    Comments:  08/02/14 0815 Arlyss Queenammy Alaira Level, RN BSN CM Pt CXR worse with eating. Md to discuss with pts son comfort care and hospice. MD is aware that pt can discharge to Hospice home or [facility] over the holiday.  07/31/14 1255 Arlyss Queenammy Lelar Farewell, RN BSN CM Pt more alert. SLP today since pt has been NPO since admission.  Per MD son is not ready to make pt comfort care. [facility] for discharge.  07/28/14 1235 Arlyss Queenammy Toia Micale, RN BSN CM Pts potassium low again today. [facility]ip[facility] with [facility] AB.  07/24/2014 1300 Kathyrn SheriffJessica Childress, RN, MSN, [facility]

## 2014-07-25 ENCOUNTER — Inpatient Hospital Stay (HOSPITAL_COMMUNITY): Payer: Medicare PPO

## 2014-07-25 LAB — COMPREHENSIVE METABOLIC PANEL
ALK PHOS: 84 U/L (ref 39–117)
ALT: 13 U/L (ref 0–35)
AST: 18 U/L (ref 0–37)
Albumin: 1.6 g/dL — ABNORMAL LOW (ref 3.5–5.2)
Anion gap: 10 (ref 5–15)
BILIRUBIN TOTAL: 0.4 mg/dL (ref 0.3–1.2)
BUN: 13 mg/dL (ref 6–23)
CHLORIDE: 117 meq/L — AB (ref 96–112)
CO2: 26 mEq/L (ref 19–32)
Calcium: 8 mg/dL — ABNORMAL LOW (ref 8.4–10.5)
Creatinine, Ser: 0.6 mg/dL (ref 0.50–1.10)
GFR calc Af Amer: 88 mL/min — ABNORMAL LOW (ref >90)
GFR calc non Af Amer: 76 mL/min — ABNORMAL LOW (ref >90)
Glucose, Bld: 107 mg/dL — ABNORMAL HIGH (ref 70–99)
POTASSIUM: 3.1 meq/L — AB (ref 3.7–5.3)
Sodium: 153 mEq/L — ABNORMAL HIGH (ref 137–147)
Total Protein: 5.8 g/dL — ABNORMAL LOW (ref 6.0–8.3)

## 2014-07-25 LAB — BASIC METABOLIC PANEL
Anion gap: 10 (ref 5–15)
BUN: 14 mg/dL (ref 6–23)
CHLORIDE: 119 meq/L — AB (ref 96–112)
CO2: 25 mEq/L (ref 19–32)
Calcium: 7.9 mg/dL — ABNORMAL LOW (ref 8.4–10.5)
Creatinine, Ser: 0.63 mg/dL (ref 0.50–1.10)
GFR calc Af Amer: 87 mL/min — ABNORMAL LOW (ref >90)
GFR calc non Af Amer: 75 mL/min — ABNORMAL LOW (ref >90)
Glucose, Bld: 125 mg/dL — ABNORMAL HIGH (ref 70–99)
POTASSIUM: 2.6 meq/L — AB (ref 3.7–5.3)
Sodium: 154 mEq/L — ABNORMAL HIGH (ref 137–147)

## 2014-07-25 LAB — GLUCOSE, CAPILLARY
GLUCOSE-CAPILLARY: 98 mg/dL (ref 70–99)
GLUCOSE-CAPILLARY: 166 mg/dL — AB (ref 70–99)
Glucose-Capillary: 111 mg/dL — ABNORMAL HIGH (ref 70–99)
Glucose-Capillary: 125 mg/dL — ABNORMAL HIGH (ref 70–99)
Glucose-Capillary: 134 mg/dL — ABNORMAL HIGH (ref 70–99)
Glucose-Capillary: 138 mg/dL — ABNORMAL HIGH (ref 70–99)
Glucose-Capillary: 153 mg/dL — ABNORMAL HIGH (ref 70–99)

## 2014-07-25 MED ORDER — POTASSIUM CHLORIDE 10 MEQ/100ML IV SOLN
INTRAVENOUS | Status: AC
  Administered 2014-07-25: 10 meq via INTRAVENOUS
  Filled 2014-07-25: qty 100

## 2014-07-25 MED ORDER — POTASSIUM CHLORIDE 10 MEQ/100ML IV SOLN
10.0000 meq | INTRAVENOUS | Status: AC
  Administered 2014-07-25 (×3): 10 meq via INTRAVENOUS
  Filled 2014-07-25 (×2): qty 100

## 2014-07-25 NOTE — Progress Notes (Signed)
Subjective: The patient is more responsive. She is being treated for sepsis healthcare associated pneumonia and encephalopathy she has left lower lobe pneumonia bun signs remained stable she does have low serum potassium which is being repleted  She remains on IV vancomycin and Zosyn  Objective: Vital signs in last 24 hours: Temp:  [96.8 F (36 C)-97.7 F (36.5 C)] 97 F (36.1 C) (11/17 0400) Pulse Rate:  [59-85] 63 (11/17 0600) Resp:  [12-31] 20 (11/17 0600) BP: (99-129)/(37-58) 113/47 mmHg (11/17 0600) SpO2:  [86 %-100 %] 97 % (11/17 0600) FiO2 (%):  [40 %] 40 % (11/16 1800) Weight:  [55.7 kg (122 lb 12.7 oz)] 55.7 kg (122 lb 12.7 oz) (11/17 0400) Weight change: 3.9 kg (8 lb 9.6 oz) Last BM Date: 07/21/14  Intake/Output from previous day: 11/16 0701 - 11/17 0700 In: 2230 [I.V.:1680; IV Piggyback:550] Out: 825 [Urine:825] Intake/Output this shift: Total I/O In: 1780 [I.V.:1680; IV Piggyback:100] Out: 500 [Urine:500]  Physical Exam: Gen. Appearance-the patient is lethargic  HEENT negative  Neck supple no JVD or thyroid abnormalities  Heart regular rhythm no murmurs  Lungs Rales and rhonchi left lower base  Abdomen no palpable organs or masses  Skin warm and dry bruising on right lower leg  No results for input(s): WBC, HGB, HCT, PLT in the last 72 hours. BMET  Recent Labs  07/23/14 0615 07/24/14 0530  NA 162* 157*  K 3.2* 2.8*  CL 126* 124*  CO2 26 22  GLUCOSE 199* 197*  BUN 31* 23  CREATININE 0.63 0.58  CALCIUM 8.2* 8.0*    Studies/Results: No results found.  Medications:  . antiseptic oral rinse  7 mL Mouth Rinse q12n4p  . chlorhexidine  15 mL Mouth Rinse BID  . enoxaparin (LOVENOX) injection  40 mg Subcutaneous Q24H  . insulin aspart  0-9 Units Subcutaneous 6 times per day  . pantoprazole (PROTONIX) IV  40 mg Intravenous QHS  . piperacillin-tazobactam (ZOSYN)  IV  3.375 g Intravenous Q8H  . vancomycin  750 mg Intravenous Q24H    . dextrose 5 %  and 0.45% NaCl 70 mL/hr at 07/25/14 0600     Assessment/Plan: 1. Bronchopneumonia left lower lobe-healthcare associated pneumonia-plan to continue current antibiotic regimen vancomycin and Zosyn will repeat chest x-ray  2. Dementia altered mental status with previous CVA-continue supportive care  3. Low serum potassium will replete   LOS: 4 days   Kathyjo Briere G 07/25/2014, 6:30 AM

## 2014-07-25 NOTE — Care Management Utilization Note (Signed)
UR review complete.  

## 2014-07-26 LAB — BASIC METABOLIC PANEL
ANION GAP: 11 (ref 5–15)
BUN: 10 mg/dL (ref 6–23)
CALCIUM: 7.7 mg/dL — AB (ref 8.4–10.5)
CHLORIDE: 113 meq/L — AB (ref 96–112)
CO2: 26 mEq/L (ref 19–32)
Creatinine, Ser: 0.58 mg/dL (ref 0.50–1.10)
GFR calc Af Amer: 89 mL/min — ABNORMAL LOW (ref >90)
GFR calc non Af Amer: 77 mL/min — ABNORMAL LOW (ref >90)
Glucose, Bld: 170 mg/dL — ABNORMAL HIGH (ref 70–99)
Potassium: 2.5 mEq/L — CL (ref 3.7–5.3)
SODIUM: 150 meq/L — AB (ref 137–147)

## 2014-07-26 LAB — GLUCOSE, CAPILLARY
GLUCOSE-CAPILLARY: 134 mg/dL — AB (ref 70–99)
Glucose-Capillary: 162 mg/dL — ABNORMAL HIGH (ref 70–99)
Glucose-Capillary: 135 mg/dL — ABNORMAL HIGH (ref 70–99)
Glucose-Capillary: 130 mg/dL — ABNORMAL HIGH (ref 70–99)
Glucose-Capillary: 119 mg/dL — ABNORMAL HIGH (ref 70–99)

## 2014-07-26 LAB — VANCOMYCIN, TROUGH: VANCOMYCIN TR: 13.2 ug/mL (ref 10.0–20.0)

## 2014-07-26 LAB — CULTURE, BLOOD (ROUTINE X 2)
CULTURE: NO GROWTH
CULTURE: NO GROWTH

## 2014-07-26 MED ORDER — POTASSIUM CHLORIDE 10 MEQ/100ML IV SOLN
10.0000 meq | INTRAVENOUS | Status: AC
  Administered 2014-07-26 (×3): 10 meq via INTRAVENOUS
  Filled 2014-07-26 (×3): qty 100

## 2014-07-26 NOTE — Consult Note (Signed)
Consult requested by:Dr. McInnis Consult requested ZOX:[id number]  NWG:NFAO is a 80 year old Caucasian female with history of previous cerebrovascular accident, dementia cardiac disease COPD and systolic congestive heart failure and previous hip fracture. She is a resident at a skilled care facility and was found to be less responsive about 24 hours prior to admission. She was brought to the hospital and started on intravenous antibiotics. She was noted to be septic. She was hyperglycemic. She was hyponatremic she was felt to be dehydrated. She has been receiving IV antibiotics IV fluids etc.chest x-ray yesterday looked worse with increasing consolidation of the left lower lobe.  Past Medical History  Diagnosis Date  . CVA (cerebral vascular accident)   . Hypertension   . Dementia   . Osteoporosis   . MI (myocardial infarction)   . COPD (chronic obstructive pulmonary disease)   . Systolic HF (heart failure)     5/14     Family History  Problem Relation Age of Onset  . Heart failure Sister   . Heart attack Sister      History   Social History  . Marital Status: Widowed    Spouse Name: N/A    Number of Children: N/A  . Years of Education: N/A   Social History Main Topics  . Smoking status: Never Smoker   . Smokeless tobacco: Never Used  . Alcohol Use: No  . Drug Use: No  . Sexual Activity: None   Other Topics Concern  . None   Social History Narrative     ROS: unobtainable    Objective: Vital signs in last 24 hours: Temp:  [96.8 F (36 C)-98.4 F (36.9 C)] 97 F (36.1 C) (11/18 0759) Pulse Rate:  [65-87] 65 (11/18 0700) Resp:  [13-26] 17 (11/18 0700) BP: (102-123)/(34-58) 120/50 mmHg (11/18 0700) SpO2:  [91 %-100 %] 100 % (11/18 0700) FiO2 (%):  [40 %] 40 % (11/17 2245) Weight:  [54.8 kg (120 lb 13 oz)] 54.8 kg (120 lb 13 oz) (11/18 0358) Weight change: -0.9 kg (-1 lb 15.8 oz) Last BM Date: 07/21/14  Intake/Output from previous day: 11/17 0701 -  11/18 0700 In: 1960 [I.V.:1610; IV Piggyback:350] Out: 550 [Urine:550]  PHYSICAL EXAM she does not respond to verbal stimuli. She moves around a little bit in the bed when I listen to her. Her pupils are reactive.  Nose and throat are clear. Her neck is supple. Her chest shows rhonchi and rales on the left and somewhat diminished breath sounds on the right. Her heart is regular without gallop. Her abdomen is soft. Extremities showed no edema central nervous system exam shows she is unresponsive but does move all 4 extremities.  Lab Results: Basic Metabolic Panel:  Recent Labs  13/08/65 1355 07/26/14 0431  NA 153* 150*  K 3.1* 2.5*  CL 117* 113*  CO2 26 26  GLUCOSE 107* 170*  BUN 13 10  CREATININE 0.60 0.58  CALCIUM 8.0* 7.7*   Liver Function Tests:  Recent Labs  07/25/14 1355  AST 18  ALT 13  ALKPHOS 84  BILITOT 0.4  PROT 5.8*  ALBUMIN 1.6*   No results for input(s): LIPASE, AMYLASE in the last 72 hours. No results for input(s): AMMONIA in the last 72 hours. CBC: No results for input(s): WBC, NEUTROABS, HGB, HCT, MCV, PLT in the last 72 hours. Cardiac Enzymes: No results for input(s): CKTOTAL, CKMB, CKMBINDEX, TROPONINI in the last 72 hours. BNP: No results for input(s): PROBNP in the last 72 hours. D-Dimer: No  results for input(s): DDIMER in the last 72 hours. CBG:  Recent Labs  07/25/14 0738 07/25/14 1140 07/25/14 1624 07/25/14 1950 07/25/14 2349 07/26/14 0354  GLUCAP 111* 134* 98 166* 138* 162*   Hemoglobin A1C: No results for input(s): HGBA1C in the last 72 hours. Fasting Lipid Panel: No results for input(s): CHOL, HDL, LDLCALC, TRIG, CHOLHDL, LDLDIRECT in the last 72 hours. Thyroid Function Tests: No results for input(s): TSH, T4TOTAL, FREET4, T3FREE, THYROIDAB in the last 72 hours. Anemia Panel: No results for input(s): VITAMINB12, FOLATE, FERRITIN, TIBC, IRON, RETICCTPCT in the last 72 hours. Coagulation: No results for input(s): LABPROT, INR  in the last 72 hours. Urine Drug Screen: Drugs of Abuse  No results found for: LABOPIA, COCAINSCRNUR, LABBENZ, AMPHETMU, THCU, LABBARB  Alcohol Level: No results for input(s): ETH in the last 72 hours. Urinalysis: No results for input(s): COLORURINE, LABSPEC, PHURINE, GLUCOSEU, HGBUR, BILIRUBINUR, KETONESUR, PROTEINUR, UROBILINOGEN, NITRITE, LEUKOCYTESUR in the last 72 hours.  Invalid input(s): APPERANCEUR Misc. Labs:   ABGS: No results for input(s): PHART, PO2ART, TCO2, HCO3 in the last 72 hours.  Invalid input(s): PCO2   MICROBIOLOGY: Recent Results (from the past 240 hour(s))  Blood Culture (routine x 2)     Status: None (Preliminary result)   Collection Time: 07/21/14  1:11 PM  Result Value Ref Range Status   Specimen Description BLOOD RIGHT ARM DRAWN BY RN  Final   Special Requests BOTTLES DRAWN AEROBIC AND ANAEROBIC 4CC  Final   Culture NO GROWTH 4 DAYS  Final   Report Status PENDING  Incomplete  Blood Culture (routine x 2)     Status: None (Preliminary result)   Collection Time: 07/21/14  1:15 PM  Result Value Ref Range Status   Specimen Description BLOOD LEFT WRIST  Final   Special Requests BOTTLES DRAWN AEROBIC ONLY 2CC  Final   Culture NO GROWTH 4 DAYS  Final   Report Status PENDING  Incomplete  Urine culture     Status: None   Collection Time: 07/21/14  3:16 PM  Result Value Ref Range Status   Specimen Description URINE, CATHETERIZED  Final   Special Requests NONE  Final   Culture  Setup Time   Final    07/22/2014 00:31 Performed at [facility]    Colony Count NO GROWTH Performed at [facility]   Final   Culture NO GROWTH Performed at [facility]   Final   Report Status 07/23/2014 FINAL  Final  MRSA PCR Screening     Status: None   Collection Time: 07/21/14  5:01 PM  Result Value Ref Range Status   MRSA by PCR NEGATIVE NEGATIVE Final    Comment:        The GeneXpert MRSA Assay (FDA approved for NASAL specimens only), is  one component of a comprehensive MRSA colonization surveillance program. It is not intended to diagnose MRSA infection nor to guide or monitor treatment for MRSA infections.     Studies/Results: Dg Chest Port 1 View  07/25/2014   CLINICAL DATA:  Pneumonia  EXAM: PORTABLE CHEST - 1 VIEW  COMPARISON:  07/21/2014  FINDINGS: Dense left lower lobe collapse/ consolidation persist. Associated left effusion not excluded. This appears slightly worse compared to 07/21/2014. Stable mild cardiomegaly with vascular congestion. Right lung remains relatively clear. No pneumothorax. Atherosclerosis of the aorta. Bones are osteopenic.  IMPRESSION: Slight worsening left lower lobe collapse/consolidation and likely associated effusion compatible with consolidative pneumonia.   Electronically Signed   By: Beryle Beamsrevor  Shick M.D.   On: 07/25/2014 08:45    Medications:  Prior to Admission:  Prescriptions prior to admission  Medication Sig Dispense Refill Last Dose  . Amino Acids-Protein Hydrolys (FEEDING SUPPLEMENT, PRO-STAT SUGAR FREE 64,) LIQD Take 30 mLs by mouth 2 (two) times daily. Between meals.   Past Week at Unknown time  . clopidogrel (PLAVIX) 75 MG tablet Take 75 mg by mouth daily.     Past Week at Unknown time  . ferrous sulfate 325 (65 FE) MG tablet Take 325 mg by mouth daily with breakfast. Crush and mix with applesauce.   Past Week at Unknown time  . HYDROcodone-acetaminophen (NORCO/VICODIN) 5-325 MG per tablet Take 1/2 tablet by mouth twice daily as needed for pain 30 tablet 0 unknown  . ipratropium-albuterol (DUONEB) 0.5-2.5 (3) MG/3ML SOLN Take 3 mLs by nebulization every 6 (six) hours as needed (shorntess of breath).   unknown  . metoprolol succinate (TOPROL-XL) 25 MG 24 hr tablet Take 25 mg by mouth daily.     Past Week at Unknown time  . spironolactone (ALDACTONE) 25 MG tablet Take 12.5 mg by mouth daily.   Past Week at Unknown time   Scheduled: . antiseptic oral rinse  7 mL Mouth Rinse q12n4p   . chlorhexidine  15 mL Mouth Rinse BID  . enoxaparin (LOVENOX) injection  40 mg Subcutaneous Q24H  . insulin aspart  0-9 Units Subcutaneous 6 times per day  . pantoprazole (PROTONIX) IV  40 mg Intravenous QHS  . piperacillin-tazobactam (ZOSYN)  IV  3.375 g Intravenous Q8H  . potassium chloride  10 mEq Intravenous Q1 Hr x 3  . vancomycin  750 mg Intravenous Q24H   Continuous: . dextrose 5 % and 0.45% NaCl 70 mL/hr at 07/26/14 0500   ZOX:WRUEAVPRN:sodium chloride, ipratropium-albuterol, ondansetron (ZOFRAN) IV  Assesment:she was admitted with sepsis related to healthcare associated pneumonia. She has been dehydrated. She is on appropriate antibiotics but has not made any improvement now. She is still encephalopathic and still somewhat hypernatremic. Her potassium level is low and has been replaced. Her prognosis is  Poor. Principal Problem:   Sepsis Active Problems:   Encephalopathy   HCAP (healthcare-associated pneumonia)   Hypernatremia   Dehydration   Hyperglycemia   Hypokalemia    Plan:continue with fluid replacement continue potassium replacement intravenous antibiotics. Try to get her sodium level down to normal and see if she becomes more responsive    LOS: 5 days   Makaya Juneau L 07/26/2014, 8:04 AM

## 2014-07-26 NOTE — Progress Notes (Signed)
Subjective: The patient is more responsive. She's been treated for sepsis healthcare associated pneumonia and encephalopathy. She has left lower lobe pneumonia. She does have a low serum potassium. She remains on IV vancomycin Zosyn. Her potassium today is 2.5  Objective: Vital signs in last 24 hours: Temp:  [96.8 F (36 C)-98.4 F (36.9 C)] 97.6 F (36.4 C) (11/18 0400) Pulse Rate:  [65-87] 77 (11/18 0500) Resp:  [13-31] 19 (11/18 0500) BP: (102-129)/(34-58) 120/49 mmHg (11/18 0500) SpO2:  [91 %-100 %] 98 % (11/18 0500) FiO2 (%):  [40 %] 40 % (11/17 2245) Weight:  [54.8 kg (120 lb 13 oz)] 54.8 kg (120 lb 13 oz) (11/18 0358) Weight change: -0.9 kg (-1 lb 15.8 oz) Last BM Date: 07/21/14  Intake/Output from previous day: 11/17 0701 - 11/18 0700 In: 1960 [I.V.:1610; IV Piggyback:350] Out: 550 [Urine:550] Intake/Output this shift: Total I/O In: 1760 [I.V.:1610; IV Piggyback:150] Out: -   Physical Exam: Gen. Appearance patient is lethargic  HEENT negative  Neck supple no JVD or thyroid abnormalities  Heart regular rhythm no murmurs  Lungs rales or rhonchi left lower lobe  Abdomen no palpable organs or masses  Skin warm and dry but bruising of her right lower leg  No results for input(s): WBC, HGB, HCT, PLT in the last 72 hours. BMET  Recent Labs  07/25/14 1355 07/26/14 0431  NA 153* 150*  K 3.1* 2.5*  CL 117* 113*  CO2 26 26  GLUCOSE 107* 170*  BUN 13 10  CREATININE 0.60 0.58  CALCIUM 8.0* 7.7*    Studies/Results: Dg Chest Port 1 View  07/25/2014   CLINICAL DATA:  Pneumonia  EXAM: PORTABLE CHEST - 1 VIEW  COMPARISON:  07/21/2014  FINDINGS: Dense left lower lobe collapse/ consolidation persist. Associated left effusion not excluded. This appears slightly worse compared to 07/21/2014. Stable mild cardiomegaly with vascular congestion. Right lung remains relatively clear. No pneumothorax. Atherosclerosis of the aorta. Bones are osteopenic.  IMPRESSION: Slight  worsening left lower lobe collapse/consolidation and likely associated effusion compatible with consolidative pneumonia.   Electronically Signed   By: Ruel Favorsrevor  Shick M.D.   On: 07/25/2014 08:45    Medications:  . antiseptic oral rinse  7 mL Mouth Rinse q12n4p  . chlorhexidine  15 mL Mouth Rinse BID  . enoxaparin (LOVENOX) injection  40 mg Subcutaneous Q24H  . insulin aspart  0-9 Units Subcutaneous 6 times per day  . pantoprazole (PROTONIX) IV  40 mg Intravenous QHS  . piperacillin-tazobactam (ZOSYN)  IV  3.375 g Intravenous Q8H  . potassium chloride  10 mEq Intravenous Q1 Hr x 3  . vancomycin  750 mg Intravenous Q24H    . dextrose 5 % and 0.45% NaCl 70 mL/hr at 07/26/14 0500     Assessment/Plan: Bronchopneumonia and left lower lobe-healthcare associated pneumonia-plan to continue current antibiotic regimen vancomycin and Zosyn-previous chest x-ray showed worsening left lower lobe collapse consolidation-plan to obtain pulmonary consult  2. Low serum potassium will replete  3. Dementia altered mental status previous CVA-plan continue supportive care   LOS: 5 days   Rebeka Kimble G 07/26/2014, 6:43 AM

## 2014-07-26 NOTE — Progress Notes (Signed)
RX received from Dr Renard MatterMcinnis for 3 runs of k replacement for pt K=2.5

## 2014-07-27 LAB — BASIC METABOLIC PANEL
Anion gap: 12 (ref 5–15)
BUN: 6 mg/dL (ref 6–23)
CHLORIDE: 108 meq/L (ref 96–112)
CO2: 26 meq/L (ref 19–32)
CREATININE: 0.51 mg/dL (ref 0.50–1.10)
Calcium: 7.9 mg/dL — ABNORMAL LOW (ref 8.4–10.5)
GFR calc non Af Amer: 80 mL/min — ABNORMAL LOW (ref >90)
GLUCOSE: 143 mg/dL — AB (ref 70–99)
POTASSIUM: 3 meq/L — AB (ref 3.7–5.3)
Sodium: 146 mEq/L (ref 137–147)

## 2014-07-27 LAB — GLUCOSE, CAPILLARY
GLUCOSE-CAPILLARY: 116 mg/dL — AB (ref 70–99)
GLUCOSE-CAPILLARY: 135 mg/dL — AB (ref 70–99)
Glucose-Capillary: 117 mg/dL — ABNORMAL HIGH (ref 70–99)
Glucose-Capillary: 133 mg/dL — ABNORMAL HIGH (ref 70–99)
Glucose-Capillary: 137 mg/dL — ABNORMAL HIGH (ref 70–99)
Glucose-Capillary: 98 mg/dL (ref 70–99)

## 2014-07-27 MED ORDER — POTASSIUM CHLORIDE 10 MEQ/100ML IV SOLN
10.0000 meq | INTRAVENOUS | Status: AC
  Administered 2014-07-27 (×3): 10 meq via INTRAVENOUS
  Filled 2014-07-27: qty 100

## 2014-07-27 MED ORDER — VANCOMYCIN HCL IN DEXTROSE 1-5 GM/200ML-% IV SOLN
1000.0000 mg | INTRAVENOUS | Status: DC
  Administered 2014-07-27 – 2014-08-01 (×6): 1000 mg via INTRAVENOUS
  Filled 2014-07-27 (×8): qty 200

## 2014-07-27 NOTE — Progress Notes (Signed)
Report given to E.Spangler,RN. Patient transferred to [id number] in stable condition via bed.

## 2014-07-27 NOTE — Clinical Social Work Note (Signed)
[facility] and [facility] could possibly be [facility] tomorrow with IV antibiotics.  [facility] indicated that they would seek authorization.    Tretha SciaraHeather Damel [place], [place] [phone]

## 2014-07-27 NOTE — Progress Notes (Signed)
Called all three children on contact list to update them that [name] was moving to the 3rd floor and request that they call Dr. Renard MatterMcInnis at his office today to talk about a plan of care. No answer from any number.

## 2014-07-27 NOTE — Progress Notes (Signed)
Subjective: This patient is a little more responsive. She's been treated for sepsis healthcare associated pneumonia and encephalopathy she has left lower lobe pneumonia. She did have low serum potassium which was repleted yesterday. She remains on IV vancomycin and Zosyn  Objective: Vital signs in last 24 hours: Temp:  [96.5 F (35.8 C)-97.4 F (36.3 C)] 96.6 F (35.9 C) (11/19 0400) Pulse Rate:  [38-107] 71 (11/19 0600) Resp:  [13-27] 14 (11/19 0600) BP: (97-132)/(36-70) 128/55 mmHg (11/19 0600) SpO2:  [91 %-100 %] 97 % (11/19 0600) Weight:  [56.1 kg (123 lb 10.9 oz)] 56.1 kg (123 lb 10.9 oz) (11/19 0456) Weight change: 1.3 kg (2 lb 13.9 oz) Last BM Date: 07/21/14  Intake/Output from previous day: 11/18 0701 - 11/19 0700 In: 2200 [I.V.:1750; IV Piggyback:450] Out: -  Intake/Output this shift: Total I/O In: 1050.7 [I.V.:900.7; IV Piggyback:150] Out: -   Physical Exam: Gen. appearance the patient is lethargic  HEENT negative  Neck supple no JVD or thyroid abnormalities  Heart regular rhythm no murmurs  Lungs rales or rhonchi and over lower lobes  Abdomen no palpable organs or masses  Skin warm and dry with bruising of the right lower leg  No results for input(s): WBC, HGB, HCT, PLT in the last 72 hours. BMET  Recent Labs  07/25/14 1355 07/26/14 0431  NA 153* 150*  K 3.1* 2.5*  CL 117* 113*  CO2 26 26  GLUCOSE 107* 170*  BUN 13 10  CREATININE 0.60 0.58  CALCIUM 8.0* 7.7*    Studies/Results: Dg Chest Port 1 View  07/25/2014   CLINICAL DATA:  Pneumonia  EXAM: PORTABLE CHEST - 1 VIEW  COMPARISON:  07/21/2014  FINDINGS: Dense left lower lobe collapse/ consolidation persist. Associated left effusion not excluded. This appears slightly worse compared to 07/21/2014. Stable mild cardiomegaly with vascular congestion. Right lung remains relatively clear. No pneumothorax. Atherosclerosis of the aorta. Bones are osteopenic.  IMPRESSION: Slight worsening left lower lobe  collapse/consolidation and likely associated effusion compatible with consolidative pneumonia.   Electronically Signed   By: Ruel Favorsrevor  Shick M.D.   On: 07/25/2014 08:45    Medications:  . antiseptic oral rinse  7 mL Mouth Rinse q12n4p  . chlorhexidine  15 mL Mouth Rinse BID  . enoxaparin (LOVENOX) injection  40 mg Subcutaneous Q24H  . insulin aspart  0-9 Units Subcutaneous 6 times per day  . pantoprazole (PROTONIX) IV  40 mg Intravenous QHS  . piperacillin-tazobactam (ZOSYN)  IV  3.375 g Intravenous Q8H  . vancomycin  750 mg Intravenous Q24H    . dextrose 5 % and 0.45% NaCl 70 mL/hr at 07/27/14 0600     Assessment/Plan: 1 bronchopneumonia left lower lobe-plan to continue antibiotic regimen vancomycin and Zosyn  2. Dementia altered mental status with previous CVA-to continue supportive care  3. Low serum potassium-repleted repeat chemistries today   LOS: 6 days   Kiley Solimine G 07/27/2014, 6:54 AM

## 2014-07-27 NOTE — Progress Notes (Signed)
Subjective: She is a little more awake and alert this morning. He remains on mask oxygen.  Objective: Vital signs in last 24 hours: Temp:  [96.5 F (35.8 C)-97.4 F (36.3 C)] 97 F (36.1 C) (11/19 0751) Pulse Rate:  [38-107] 71 (11/19 0600) Resp:  [13-27] 14 (11/19 0600) BP: (97-132)/(36-70) 128/55 mmHg (11/19 0600) SpO2:  [91 %-100 %] 97 % (11/19 0600) Weight:  [56.1 kg (123 lb 10.9 oz)] 56.1 kg (123 lb 10.9 oz) (11/19 0456) Weight change: 1.3 kg (2 lb 13.9 oz) Last BM Date: 07/21/14  Intake/Output from previous day: 11/18 0701 - 11/19 0700 In: 2200 [I.V.:1750; IV Piggyback:450] Out: -   PHYSICAL EXAM General appearance: She is more alert than yesterday. She does not really respond. She is looking around the room. She does not appear to be in any acute distress Resp: rhonchi bilaterally Cardio: regular rate and rhythm, S1, S2 normal, no murmur, click, rub or gallop GI: soft, non-tender; bowel sounds normal; no masses,  no organomegaly Extremities: extremities normal, atraumatic, no cyanosis or edema  Lab Results:  Results for orders placed or performed during the hospital encounter of 07/21/14 (from the past 48 hour(s))  Glucose, capillary     Status: Abnormal   Collection Time: 07/25/14 11:40 AM  Result Value Ref Range   Glucose-Capillary 134 (H) 70 - 99 mg/dL   Comment 1 Documented in Chart    Comment 2 Notify RN   Comprehensive metabolic panel     Status: Abnormal   Collection Time: 07/25/14  1:55 PM  Result Value Ref Range   Sodium 153 (H) 137 - 147 mEq/L   Potassium 3.1 (L) 3.7 - 5.3 mEq/L   Chloride 117 (H) 96 - 112 mEq/L   CO2 26 19 - 32 mEq/L   Glucose, Bld 107 (H) 70 - 99 mg/dL   BUN 13 6 - 23 mg/dL   Creatinine, Ser 0.60 0.50 - 1.10 mg/dL   Calcium 8.0 (L) 8.4 - 10.5 mg/dL   Total Protein 5.8 (L) 6.0 - 8.3 g/dL   Albumin 1.6 (L) 3.5 - 5.2 g/dL   AST 18 0 - 37 U/L   ALT 13 0 - 35 U/L   Alkaline Phosphatase 84 39 - 117 U/L   Total Bilirubin 0.4 0.3 - 1.2  mg/dL   GFR calc non Af Amer 76 (L) >90 mL/min   GFR calc Af Amer 88 (L) >90 mL/min    Comment: (NOTE) The eGFR has been calculated using the CKD EPI equation. This calculation has not been validated in all clinical situations. eGFR's persistently <90 mL/min signify possible Chronic Kidney Disease.    Anion gap 10 5 - 15  Glucose, capillary     Status: None   Collection Time: 07/25/14  4:24 PM  Result Value Ref Range   Glucose-Capillary 98 70 - 99 mg/dL   Comment 1 Documented in Chart    Comment 2 Notify RN   Glucose, capillary     Status: Abnormal   Collection Time: 07/25/14  7:50 PM  Result Value Ref Range   Glucose-Capillary 166 (H) 70 - 99 mg/dL  Glucose, capillary     Status: Abnormal   Collection Time: 07/25/14 11:49 PM  Result Value Ref Range   Glucose-Capillary 138 (H) 70 - 99 mg/dL  Glucose, capillary     Status: Abnormal   Collection Time: 07/26/14  3:54 AM  Result Value Ref Range   Glucose-Capillary 162 (H) 70 - 99 mg/dL  Basic metabolic panel  Status: Abnormal   Collection Time: 07/26/14  4:31 AM  Result Value Ref Range   Sodium 150 (H) 137 - 147 mEq/L   Potassium 2.5 (LL) 3.7 - 5.3 mEq/L    Comment: CRITICAL RESULT CALLED TO, READ BACK BY AND VERIFIED WITH: WAGONER,R AT 5:50AM ON 07/26/14 BY FESTERMAN,C    Chloride 113 (H) 96 - 112 mEq/L   CO2 26 19 - 32 mEq/L   Glucose, Bld 170 (H) 70 - 99 mg/dL   BUN 10 6 - 23 mg/dL   Creatinine, Ser 0.58 0.50 - 1.10 mg/dL   Calcium 7.7 (L) 8.4 - 10.5 mg/dL   GFR calc non Af Amer 77 (L) >90 mL/min   GFR calc Af Amer 89 (L) >90 mL/min    Comment: (NOTE) The eGFR has been calculated using the CKD EPI equation. This calculation has not been validated in all clinical situations. eGFR's persistently <90 mL/min signify possible Chronic Kidney Disease.    Anion gap 11 5 - 15  Glucose, capillary     Status: Abnormal   Collection Time: 07/26/14  7:24 AM  Result Value Ref Range   Glucose-Capillary 135 (H) 70 - 99 mg/dL    Comment 1 Notify RN   Glucose, capillary     Status: Abnormal   Collection Time: 07/26/14 11:49 AM  Result Value Ref Range   Glucose-Capillary 130 (H) 70 - 99 mg/dL   Comment 1 Notify RN   Glucose, capillary     Status: Abnormal   Collection Time: 07/26/14  4:38 PM  Result Value Ref Range   Glucose-Capillary 119 (H) 70 - 99 mg/dL   Comment 1 Notify RN   Vancomycin, trough     Status: None   Collection Time: 07/26/14  5:07 PM  Result Value Ref Range   Vancomycin Tr 13.2 10.0 - 20.0 ug/mL  Glucose, capillary     Status: Abnormal   Collection Time: 07/26/14  7:43 PM  Result Value Ref Range   Glucose-Capillary 134 (H) 70 - 99 mg/dL   Comment 1 Notify RN   Glucose, capillary     Status: None   Collection Time: 07/27/14 12:09 AM  Result Value Ref Range   Glucose-Capillary 98 70 - 99 mg/dL   Comment 1 Notify RN   Glucose, capillary     Status: Abnormal   Collection Time: 07/27/14  3:53 AM  Result Value Ref Range   Glucose-Capillary 117 (H) 70 - 99 mg/dL   Comment 1 Notify RN   Glucose, capillary     Status: Abnormal   Collection Time: 07/27/14  7:20 AM  Result Value Ref Range   Glucose-Capillary 133 (H) 70 - 99 mg/dL   Comment 1 Notify RN     ABGS No results for input(s): PHART, PO2ART, TCO2, HCO3 in the last 72 hours.  Invalid input(s): PCO2 CULTURES Recent Results (from the past 240 hour(s))  Blood Culture (routine x 2)     Status: None   Collection Time: 07/21/14  1:11 PM  Result Value Ref Range Status   Specimen Description BLOOD RIGHT ARM DRAWN BY RN  Final   Special Requests BOTTLES DRAWN AEROBIC AND ANAEROBIC 4CC  Final   Culture NO GROWTH 5 DAYS  Final   Report Status 07/26/2014 FINAL  Final  Blood Culture (routine x 2)     Status: None   Collection Time: 07/21/14  1:15 PM  Result Value Ref Range Status   Specimen Description BLOOD LEFT WRIST  Final  Special Requests BOTTLES DRAWN AEROBIC ONLY 2CC  Final   Culture NO GROWTH 5 DAYS  Final   Report Status  07/26/2014 FINAL  Final  Urine culture     Status: None   Collection Time: 07/21/14  3:16 PM  Result Value Ref Range Status   Specimen Description URINE, CATHETERIZED  Final   Special Requests NONE  Final   Culture  Setup Time   Final    07/22/2014 00:31 Performed at [place] Performed at [facility]   Final   Culture NO GROWTH Performed at [facility]   Final   Report Status 07/23/2014 FINAL  Final  MRSA PCR Screening     Status: None   Collection Time: 07/21/14  5:01 PM  Result Value Ref Range Status   MRSA by PCR NEGATIVE NEGATIVE Final    Comment:        The GeneXpert MRSA Assay (FDA approved for NASAL specimens only), is one component of a comprehensive MRSA colonization surveillance program. It is not intended to diagnose MRSA infection nor to guide or monitor treatment for MRSA infections.    Studies/Results: Dg Chest Port 1 View  07/25/2014   CLINICAL DATA:  Pneumonia  EXAM: PORTABLE CHEST - 1 VIEW  COMPARISON:  07/21/2014  FINDINGS: Dense left lower lobe collapse/ consolidation persist. Associated left effusion not excluded. This appears slightly worse compared to 07/21/2014. Stable mild cardiomegaly with vascular congestion. Right lung remains relatively clear. No pneumothorax. Atherosclerosis of the aorta. Bones are osteopenic.  IMPRESSION: Slight worsening left lower lobe collapse/consolidation and likely associated effusion compatible with consolidative pneumonia.   Electronically Signed   By: Daryll Brod M.D.   On: 07/25/2014 08:45    Medications:  Prior to Admission:  Prescriptions prior to admission  Medication Sig Dispense Refill Last Dose  . Amino Acids-Protein Hydrolys (FEEDING SUPPLEMENT, PRO-STAT SUGAR FREE 64,) LIQD Take 30 mLs by mouth 2 (two) times daily. Between meals.   Past Week at Unknown time  . clopidogrel (PLAVIX) 75 MG tablet Take 75 mg by mouth daily.     Past Week at Unknown time   . ferrous sulfate 325 (65 FE) MG tablet Take 325 mg by mouth daily with breakfast. Crush and mix with applesauce.   Past Week at Unknown time  . HYDROcodone-acetaminophen (NORCO/VICODIN) 5-325 MG per tablet Take 1/2 tablet by mouth twice daily as needed for pain 30 tablet 0 unknown  . ipratropium-albuterol (DUONEB) 0.5-2.5 (3) MG/3ML SOLN Take 3 mLs by nebulization every 6 (six) hours as needed (shorntess of breath).   unknown  . metoprolol succinate (TOPROL-XL) 25 MG 24 hr tablet Take 25 mg by mouth daily.     Past Week at Unknown time  . spironolactone (ALDACTONE) 25 MG tablet Take 12.5 mg by mouth daily.   Past Week at Unknown time   Scheduled: . antiseptic oral rinse  7 mL Mouth Rinse q12n4p  . chlorhexidine  15 mL Mouth Rinse BID  . enoxaparin (LOVENOX) injection  40 mg Subcutaneous Q24H  . insulin aspart  0-9 Units Subcutaneous 6 times per day  . pantoprazole (PROTONIX) IV  40 mg Intravenous QHS  . piperacillin-tazobactam (ZOSYN)  IV  3.375 g Intravenous Q8H  . vancomycin  750 mg Intravenous Q24H   Continuous: . dextrose 5 % and 0.45% NaCl 70 mL/hr at 07/27/14 0600   TWS:FKCLEX chloride, ipratropium-albuterol, ondansetron (ZOFRAN) IV  Assesment: She was admitted with healthcare associated  pneumonia and sepsis. She has had hypernatremia and hypokalemia and lab work is pending. She is still requiring mask oxygen going to try her on nasal cannula today. I agree she is okay to move out of stepdown Principal Problem:   Sepsis Active Problems:   Encephalopathy   HCAP (healthcare-associated pneumonia)   Hypernatremia   Dehydration   Hyperglycemia   Hypokalemia    Plan: Continue current treatments. Try to reduce her oxygen requirement. Move from stepdown    LOS: 6 days   [name] L 07/27/2014, 7:58 AM

## 2014-07-27 NOTE — Progress Notes (Signed)
INITIAL NUTRITION ASSESSMENT  DOCUMENTATION CODES Per approved criteria  -Severe malnutrition in the context of chronic illness   Pt meets criteria for severe MALNUTRITION in the context of chronic illness as evidenced by moderate to severe fat and muscle depletion, 11.6% wt loss x 6 months.  INTERVENTION: -RD to follow for diet advancement and goals of care  NUTRITION DIAGNOSIS: Inadequate oral intake related to inability to eat as evidenced by NPO.   Goal: Pt will meet >90% of estimated nutritional needs  Monitor:  Diet advancement, labs, weight changes, I/O's, goals of care  Reason for Assessment: Low braden  80 y.o. female  Admitting Dx: Sepsis  [name] is a 80 y.o. female with a history of hypertension, COPD, dementia, cerebrovascular accident, left hip fracture, congestive heart failure. [facility] She was brought to the hospital due to fever, tachycardia, hypoxia, tachypnea. Per the patient's son, he noted some increased weakness and decreased interaction in her yesterday compared to normal.   ASSESSMENT: [facility] for sepsis.  She has been NPO since admission (6 days). Pt continues to be poorly responsive. When this RD called pt's name, she quickly looked up, but fell back asleep.  Per RN, family has been advised to contact MD regarding goals of care.  Diet PTA was NAS with honey thick liquids. She also received Magic Cup BID and 30 ml Prostat BID for additional nutrition support. [facility], [facility] Documented wt hx reveals a 11.5% wt loss in the past 6 months. Labs reviewed. K: 3.0, Calcium: 7.9, Glucose: 134. CBGS: 98-113. Mg and Phos WDL.   Nutrition Focused Physical Exam:  Subcutaneous Fat:  Orbital Region: severe depletion Upper Arm Region: severe depletion Thoracic and Lumbar Region: WDL  Muscle:  Temple Region: severe depletion Clavicle Bone Region: severe  depletion Clavicle and Acromion Bone Region: severe depletion Scapular Bone Region: severe depletion Dorsal Hand: WDL Patellar Region: moderate depletion Anterior Thigh Region: moderate depletion Posterior Calf Region: moderate depletion  Edema: present on rt hand and rt lower arm  Height: Ht Readings from Last 1 Encounters:  07/21/14 5\' 5"  (1.651 m)    Weight: Wt Readings from Last 1 Encounters:  07/27/14 123 lb 10.9 oz (56.1 kg)    Ideal Body Weight: 125#  % Ideal Body Weight: 98%  Wt Readings from Last 10 Encounters:  07/27/14 123 lb 10.9 oz (56.1 kg)  05/25/14 122 lb 9.2 oz (55.6 kg)  02/05/13 139 lb 8.8 oz (63.3 kg)  01/24/13 130 lb 8.2 oz (59.2 kg)    Usual Body Weight: 130#  % Usual Body Weight: 95%  BMI:  Body mass index is 20.58 kg/(m^2). Normal weight range  Estimated Nutritional Needs: Kcal: 1800-2000 Protein: 84-94 grams Fluid: 1.8-2.0 L  Skin: stage II pressure ulcers on buttocks, coccyx lt lateral foot; unstageable pressure ulcer on rt toe; DTI on rt heel  Diet Order: Diet NPO time specified  EDUCATION NEEDS: -Education not appropriate at this time   Intake/Output Summary (Last 24 hours) at 07/27/14 1045 Last data filed at 07/27/14 0900  Gross per 24 hour  Intake   2200 ml  Output      0 ml  Net   2200 ml    Last BM: 07/21/14  Labs:   Recent Labs Lab 07/22/14 0551  07/25/14 1355 07/26/14 0431 07/27/14 0741  NA 166*  < > 153* 150* 146  K 3.5*  < > 3.1* 2.5* 3.0*  CL 129*  < > 117* 113* 108  CO2 27  < > 26 26 26   BUN 46*  < > 13 10 6   CREATININE 0.65  < > 0.60 0.58 0.51  CALCIUM 8.3*  < > 8.0* 7.7* 7.9*  MG 2.2  --   --   --   --   PHOS 2.4  --   --   --   --   GLUCOSE 173*  < > 107* 170* 143*  < > = values in this interval not displayed.  CBG (last 3)   Recent Labs  07/27/14 0009 07/27/14 0353 07/27/14 0720  GLUCAP 98 117* 133*    Scheduled Meds: . antiseptic oral rinse  7 mL Mouth Rinse q12n4p  . chlorhexidine   15 mL Mouth Rinse BID  . enoxaparin (LOVENOX) injection  40 mg Subcutaneous Q24H  . insulin aspart  0-9 Units Subcutaneous 6 times per day  . pantoprazole (PROTONIX) IV  40 mg Intravenous QHS  . piperacillin-tazobactam (ZOSYN)  IV  3.375 g Intravenous Q8H  . potassium chloride  10 mEq Intravenous Q1 Hr x 3  . vancomycin  1,000 mg Intravenous Q24H    Continuous Infusions: . dextrose 5 % and 0.45% NaCl 70 mL/hr at 07/27/14 1009    Past Medical History  Diagnosis Date  . CVA (cerebral vascular accident)   . Hypertension   . Dementia   . Osteoporosis   . MI (myocardial infarction)   . COPD (chronic obstructive pulmonary disease)   . Systolic HF (heart failure)     5/14    History reviewed. No pertinent past surgical history.  Garett Tetzloff A. Mayford KnifeWilliams, RD, LDN Pager: [phone]

## 2014-07-27 NOTE — Progress Notes (Signed)
ANTIBIOTIC CONSULT NOTE  Pharmacy Consult for Vancomycin & Zosyn Indication: PNA  No Known Allergies  Patient Measurements: Height: 5\' 5"  (165.1 cm) Weight: 123 lb 10.9 oz (56.1 kg) IBW/kg (Calculated) : 57  Vital Signs: Temp: 97 F (36.1 C) (11/19 0751) Temp Source: Axillary (11/19 0751) BP: 128/55 mmHg (11/19 0600) Pulse Rate: 72 (11/19 0800) Intake/Output from previous day: 11/18 0701 - 11/19 0700 In: 2200 [I.V.:1750; IV Piggyback:450] Out: -  Intake/Output from this shift:    Labs:  Recent Labs  07/25/14 0732 07/25/14 1355 07/26/14 0431  CREATININE 0.63 0.60 0.58   Estimated Creatinine Clearance: 38.9 mL/min (by C-G formula based on Cr of 0.58).  Recent Labs  07/26/14 1707  VANCOTROUGH 13.2     Microbiology: Recent Results (from the past 720 hour(s))  Blood Culture (routine x 2)     Status: None   Collection Time: 07/21/14  1:11 PM  Result Value Ref Range Status   Specimen Description BLOOD RIGHT ARM DRAWN BY RN  Final   Special Requests BOTTLES DRAWN AEROBIC AND ANAEROBIC 4CC  Final   Culture NO GROWTH 5 DAYS  Final   Report Status 07/26/2014 FINAL  Final  Blood Culture (routine x 2)     Status: None   Collection Time: 07/21/14  1:15 PM  Result Value Ref Range Status   Specimen Description BLOOD LEFT WRIST  Final   Special Requests BOTTLES DRAWN AEROBIC ONLY 2CC  Final   Culture NO GROWTH 5 DAYS  Final   Report Status 07/26/2014 FINAL  Final  Urine culture     Status: None   Collection Time: 07/21/14  3:16 PM  Result Value Ref Range Status   Specimen Description URINE, CATHETERIZED  Final   Special Requests NONE  Final   Culture  Setup Time   Final    07/22/2014 00:31 Performed at [facility]    Colony Count NO GROWTH Performed at [facility]   Final   Culture NO GROWTH Performed at [facility]   Final   Report Status 07/23/2014 FINAL  Final  MRSA PCR Screening     Status: None   Collection Time: 07/21/14  5:01  PM  Result Value Ref Range Status   MRSA by PCR NEGATIVE NEGATIVE Final    Comment:        The GeneXpert MRSA Assay (FDA approved for NASAL specimens only), is one component of a comprehensive MRSA colonization surveillance program. It is not intended to diagnose MRSA infection nor to guide or monitor treatment for MRSA infections.     Anti-infectives    Start     Dose/Rate Route Frequency Ordered Stop   07/27/14 1800  vancomycin (VANCOCIN) IVPB 1000 mg/200 mL premix     1,000 mg200 mL/hr over 60 Minutes Intravenous Every 24 hours 07/27/14 0809     07/22/14 1400  vancomycin (VANCOCIN) IVPB 750 mg/150 ml premix  Status:  Discontinued     750 mg150 mL/hr over 60 Minutes Intravenous Every 24 hours 07/21/14 1701 07/27/14 0809   07/21/14 2000  piperacillin-tazobactam (ZOSYN) IVPB 3.375 g     3.375 g12.5 mL/hr over 240 Minutes Intravenous Every 8 hours 07/21/14 1701     07/21/14 1430  piperacillin-tazobactam (ZOSYN) IVPB 3.375 g  Status:  Discontinued     3.375 g100 mL/hr over 30 Minutes Intravenous  Once 07/21/14 1422 07/21/14 1607   07/21/14 1300  piperacillin-tazobactam (ZOSYN) IVPB 3.375 g     3.375 g100 mL/hr over 30  Minutes Intravenous  Once 07/21/14 1259 07/21/14 1407   07/21/14 1300  vancomycin (VANCOCIN) IVPB 1000 mg/200 mL premix     1,000 mg200 mL/hr over 60 Minutes Intravenous  Once 07/21/14 1259 07/21/14 1519      Assessment: 89 [facility], [facility], hypoxia, tachypnea.  WBC & lactic acid levels were elevated on admission, but have normalized.  CXR + possible LLL PNA.  Cx data NGTD. On day#7 empiric, broad-spectrum antibiotics. Renal function remains at patient's baseline.  Vancomycin trough slightly below goal.  Vancomycin 11/13>> Zosyn 11/13>>   Goal of Therapy:  Vancomycin trough level 15-20 mcg/ml  Plan:  Continue Zosyn 3.375gm IV Q8h to be infused over 4hrs Increase Vancomycin 1000mg  IV q24h Check Vancomycin trough at steady  state Monitor renal function and cx data  Duration of therapy per MD- consider d/c or de-escalate antibiotic. [name], Veola Cafaro Michelle 07/27/2014,8:09 AM

## 2014-07-28 DIAGNOSIS — E43 Unspecified severe protein-calorie malnutrition: Secondary | ICD-10-CM | POA: Insufficient documentation

## 2014-07-28 LAB — BASIC METABOLIC PANEL
ANION GAP: 10 (ref 5–15)
BUN: 5 mg/dL — ABNORMAL LOW (ref 6–23)
CALCIUM: 7.9 mg/dL — AB (ref 8.4–10.5)
CO2: 27 mEq/L (ref 19–32)
Chloride: 108 mEq/L (ref 96–112)
Creatinine, Ser: 0.49 mg/dL — ABNORMAL LOW (ref 0.50–1.10)
GFR calc Af Amer: >90 mL/min (ref >90)
GFR, EST NON AFRICAN AMERICAN: 81 mL/min — AB (ref >90)
Glucose, Bld: 158 mg/dL — ABNORMAL HIGH (ref 70–99)
Potassium: 3 mEq/L — ABNORMAL LOW (ref 3.7–5.3)
Sodium: 145 mEq/L (ref 137–147)

## 2014-07-28 LAB — GLUCOSE, CAPILLARY
GLUCOSE-CAPILLARY: 175 mg/dL — AB (ref 70–99)
Glucose-Capillary: 125 mg/dL — ABNORMAL HIGH (ref 70–99)
Glucose-Capillary: 141 mg/dL — ABNORMAL HIGH (ref 70–99)
Glucose-Capillary: 144 mg/dL — ABNORMAL HIGH (ref 70–99)
Glucose-Capillary: 146 mg/dL — ABNORMAL HIGH (ref 70–99)
Glucose-Capillary: 155 mg/dL — ABNORMAL HIGH (ref 70–99)

## 2014-07-28 NOTE — Progress Notes (Signed)
Subjective: The patient is lethargic. She is being treated for sepsis healthcare associated pneumonia and encephalopathy. She has left lower lobe pneumonia which is slowly improving. She remains on IV vancomycin Zosyn. She has had low serum potassium levels  Objective: Vital signs in last 24 hours: Temp:  [96.5 F (35.8 C)-97.7 F (36.5 C)] 97.6 F (36.4 C) (11/20 0500) Pulse Rate:  [72-89] 89 (11/20 0500) Resp:  [18] 18 (11/20 0500) BP: (108-131)/(45-66) 131/66 mmHg (11/19 2133) SpO2:  [94 %-99 %] 99 % (11/20 0500) Weight:  [54.5 kg (120 lb 2.4 oz)] 54.5 kg (120 lb 2.4 oz) (11/20 0400) Weight change: -1.6 kg (-3 lb 8.4 oz) Last BM Date: 07/21/14  Intake/Output from previous day: 11/19 0701 - 11/20 0700 In: 0  Out: 200 [Urine:200] Intake/Output this shift: Total I/O In: -  Out: 200 [Urine:200]  Physical Exam: Gen. appearance-the patient is lethargic  HEENT negative  Neck supple no JVD or thyroid abnormalities  Heart regular rhythm no murmurs  Lungs Rales heard occasionally over lower lung field  Abdomen no palpable organs or masses  Skin warm and dry with bruising of right lower leg  No results for input(s): WBC, HGB, HCT, PLT in the last 72 hours. BMET  Recent Labs  07/26/14 0431 07/27/14 0741  NA 150* 146  K 2.5* 3.0*  CL 113* 108  CO2 26 26  GLUCOSE 170* 143*  BUN 10 6  CREATININE 0.58 0.51  CALCIUM 7.7* 7.9*    Studies/Results: No results found.  Medications:  . antiseptic oral rinse  7 mL Mouth Rinse q12n4p  . chlorhexidine  15 mL Mouth Rinse BID  . enoxaparin (LOVENOX) injection  40 mg Subcutaneous Q24H  . insulin aspart  0-9 Units Subcutaneous 6 times per day  . pantoprazole (PROTONIX) IV  40 mg Intravenous QHS  . piperacillin-tazobactam (ZOSYN)  IV  3.375 g Intravenous Q8H  . vancomycin  1,000 mg Intravenous Q24H    . dextrose 5 % and 0.45% NaCl 70 mL/hr at 07/28/14 0601     Assessment/Plan: 1. Bronchopneumonia left lower lobe-plan to  continue current antibiotic regimen of vancomycin and Zosyn  2. Dementia altered mental status with previous CVA-to continue supportive care  3. Low serum potassium will repeat chemistries today and replete potassium if needed   LOS: 7 days   Mir Fullilove G 07/28/2014, 6:22 AM

## 2014-07-28 NOTE — Plan of Care (Signed)
Problem: Phase I Progression Outcomes Goal: Voiding-avoid urinary catheter unless indicated Outcome: Completed/Met Date Met:  07/28/14

## 2014-07-28 NOTE — Clinical Social Work Note (Signed)
CSW spoke with Dr. Renard MatterMcInnis regarding patient discharge.  Dr. Renard MatterMcInnis indicated that patient would be on IV antibiotics for one more week until 08/04/14.    [facility] CSW advised Lorina RabonKeri that patient could be discharged this weekend.  CSW advised that patient would be on intravenous antibiotics until 08/04/14.  CSW advised that the intravenous antibiotics were started on 07/21/14.  Lorina RabonKeri was agreeable to patient's potential weekend discharge.  Tretha SciaraHeather Valente [place], [place] [phone]

## 2014-07-28 NOTE — Progress Notes (Signed)
MD notified that patient's son was present at bedside.  MD requested that son come by the MD's office.  Son stated that he would go by the office this afternoon.

## 2014-07-29 LAB — GLUCOSE, CAPILLARY
GLUCOSE-CAPILLARY: 161 mg/dL — AB (ref 70–99)
GLUCOSE-CAPILLARY: 131 mg/dL — AB (ref 70–99)
Glucose-Capillary: 120 mg/dL — ABNORMAL HIGH (ref 70–99)
Glucose-Capillary: 128 mg/dL — ABNORMAL HIGH (ref 70–99)
Glucose-Capillary: 137 mg/dL — ABNORMAL HIGH (ref 70–99)
Glucose-Capillary: 142 mg/dL — ABNORMAL HIGH (ref 70–99)

## 2014-07-29 LAB — BASIC METABOLIC PANEL
ANION GAP: 11 (ref 5–15)
BUN: 4 mg/dL — ABNORMAL LOW (ref 6–23)
CALCIUM: 8 mg/dL — AB (ref 8.4–10.5)
CHLORIDE: 106 meq/L (ref 96–112)
CO2: 29 mEq/L (ref 19–32)
Creatinine, Ser: 0.57 mg/dL (ref 0.50–1.10)
GFR calc Af Amer: 90 mL/min — ABNORMAL LOW (ref >90)
GFR calc non Af Amer: 77 mL/min — ABNORMAL LOW (ref >90)
Glucose, Bld: 140 mg/dL — ABNORMAL HIGH (ref 70–99)
Potassium: 3 mEq/L — ABNORMAL LOW (ref 3.7–5.3)
SODIUM: 146 meq/L (ref 137–147)

## 2014-07-29 NOTE — Plan of Care (Signed)
Problem: Phase I Progression Outcomes Goal: OOB as tolerated unless otherwise ordered Outcome: Not Applicable Date Met:  07/29/14     

## 2014-07-29 NOTE — Progress Notes (Signed)
She continues lethargic and largely unresponsive. Her chest shows rhonchi bilaterally. I would continue current medications. I will plan to follow more peripherally

## 2014-07-29 NOTE — Progress Notes (Signed)
Subjective: The patient remains lethargic however she is more alert during the day. She is being treated for sepsis healthcare associated pneumonia and encephalopathy. She has left lower lobe pneumonia which is slowly improving. She remains on IV vancomycin and Zosyn. She has low serum potassium levels which are being repleted  Objective: Vital signs in last 24 hours: Temp:  [97.3 F (36.3 C)-97.6 F (36.4 C)] 97.6 F (36.4 C) (11/21 0625) Pulse Rate:  [101-107] 104 (11/21 0625) Resp:  [18] 18 (11/21 0625) BP: (127-145)/(52-62) 136/62 mmHg (11/21 0625) SpO2:  [92 %-93 %] 93 % (11/21 0625) Weight:  [55.5 kg (122 lb 5.7 oz)] 55.5 kg (122 lb 5.7 oz) (11/21 0300) Weight change: 1 kg (2 lb 3.3 oz) Last BM Date: 07/21/14  Intake/Output from previous day: 11/20 0701 - 11/21 0700 In: 1700 [I.V.:1400; IV Piggyback:300] Out: -  Intake/Output this shift:    Physical Exam: Gen. appearance patient is lethargic  HEENT negative  Neck supple no JVD or thyroid abnormalities  Heart regular rhythm no murmurs  Lungs rales and rhonchi heard over lower lung fields  Abdomen no palpable organs or masses  Skin warm and dry with bruising right lower leg  No results for input(s): WBC, HGB, HCT, PLT in the last 72 hours. BMET  Recent Labs  07/27/14 0741 07/28/14 0627  NA 146 145  K 3.0* 3.0*  CL 108 108  CO2 26 27  GLUCOSE 143* 158*  BUN 6 5*  CREATININE 0.51 0.49*  CALCIUM 7.9* 7.9*    Studies/Results: No results found.  Medications:  . antiseptic oral rinse  7 mL Mouth Rinse q12n4p  . chlorhexidine  15 mL Mouth Rinse BID  . enoxaparin (LOVENOX) injection  40 mg Subcutaneous Q24H  . insulin aspart  0-9 Units Subcutaneous 6 times per day  . pantoprazole (PROTONIX) IV  40 mg Intravenous QHS  . piperacillin-tazobactam (ZOSYN)  IV  3.375 g Intravenous Q8H  . vancomycin  1,000 mg Intravenous Q24H    . dextrose 5 % and 0.45% NaCl 70 mL/hr at 07/29/14 0300      Assessment/Plan: 1. Bronchopneumonia left lower lobe-plan to continue current antibiotic regimen of vancomycin and Zosyn  2. Dementia altered mental status with previous CVA continue supportive care  3. Low serum potassium potassium levels continue to replete potassium   LOS: 8 days   Nashid Pellum G 07/29/2014, 8:42 AM

## 2014-07-30 LAB — GLUCOSE, CAPILLARY
GLUCOSE-CAPILLARY: 109 mg/dL — AB (ref 70–99)
GLUCOSE-CAPILLARY: 124 mg/dL — AB (ref 70–99)
GLUCOSE-CAPILLARY: 102 mg/dL — AB (ref 70–99)
Glucose-Capillary: 122 mg/dL — ABNORMAL HIGH (ref 70–99)
Glucose-Capillary: 124 mg/dL — ABNORMAL HIGH (ref 70–99)
Glucose-Capillary: 141 mg/dL — ABNORMAL HIGH (ref 70–99)
Glucose-Capillary: 142 mg/dL — ABNORMAL HIGH (ref 70–99)

## 2014-07-30 LAB — BASIC METABOLIC PANEL
ANION GAP: 11 (ref 5–15)
BUN: 4 mg/dL — ABNORMAL LOW (ref 6–23)
CO2: 27 meq/L (ref 19–32)
CREATININE: 0.57 mg/dL (ref 0.50–1.10)
Calcium: 7.8 mg/dL — ABNORMAL LOW (ref 8.4–10.5)
Chloride: 106 mEq/L (ref 96–112)
GFR calc Af Amer: 90 mL/min — ABNORMAL LOW (ref >90)
GFR calc non Af Amer: 77 mL/min — ABNORMAL LOW (ref >90)
Glucose, Bld: 134 mg/dL — ABNORMAL HIGH (ref 70–99)
Potassium: 2.4 mEq/L — CL (ref 3.7–5.3)
SODIUM: 144 meq/L (ref 137–147)

## 2014-07-30 MED ORDER — POTASSIUM CHLORIDE 10 MEQ/100ML IV SOLN
10.0000 meq | INTRAVENOUS | Status: AC
  Administered 2014-07-30 (×6): 10 meq via INTRAVENOUS
  Filled 2014-07-30 (×6): qty 100

## 2014-07-30 NOTE — Progress Notes (Signed)
Pt has been receiving IV KCl throughout the day. Pt is still lethargic, appearing to sleep most of the time but will open her eyes and look at you if you call her name and use gentle touch. Pt likes to be called "Pearl". Pt a DNR. Oral care provided by swabbing mouth and using suction if needed. Pt appears to be in no pain. Had small episode of decreased breathing around 1600, but I stayed with her in her room at least 30 mins to monitor breathing and any signs of distress. Pt began snoring and still currently resting. Will continue to monitor

## 2014-07-30 NOTE — Progress Notes (Signed)
Subjective: The patient remains lethargic but is more alert today. She is being treated for sepsis healthcare associated pneumonia and encephalopathy. She has left lower lobe infiltrate which is slowly improving. She remains on IV vancomycin Zosyn now has low serum potassium level which are being repleted.  Objective: Vital signs in last 24 hours: Temp:  [98.3 F (36.8 C)-98.6 F (37 C)] 98.6 F (37 C) (11/22 0647) Pulse Rate:  [90-95] 93 (11/22 0647) Resp:  [18-20] 18 (11/22 0647) BP: (115-142)/(53-68) 142/68 mmHg (11/22 0647) SpO2:  [92 %-97 %] 93 % (11/22 0647) Weight:  [55.883 kg (123 lb 3.2 oz)] 55.883 kg (123 lb 3.2 oz) (11/22 0500) Weight change: 0.383 kg (13.5 oz) Last BM Date: 07/21/14  Intake/Output from previous day: 11/21 0701 - 11/22 0700 In: 940 [I.V.:840; IV Piggyback:100] Out: -  Intake/Output this shift:    Physical Exam: Gen. appearance-patient is lethargic  HEENT negative  Neck supple no JVD or thyroid abnormalities  Heart regular rhythm no murmurs  Lungs Rales and rhonchi heard over lower lung field  Abdomen the palpable organs or masses  Skin warm and dry with bruise right lower leg  No results for input(s): WBC, HGB, HCT, PLT in the last 72 hours. BMET  Recent Labs  07/28/14 0627 07/29/14 0848  NA 145 146  K 3.0* 3.0*  CL 108 106  CO2 27 29  GLUCOSE 158* 140*  BUN 5* 4*  CREATININE 0.49* 0.57  CALCIUM 7.9* 8.0*    Studies/Results: No results found.  Medications:  . antiseptic oral rinse  7 mL Mouth Rinse q12n4p  . chlorhexidine  15 mL Mouth Rinse BID  . enoxaparin (LOVENOX) injection  40 mg Subcutaneous Q24H  . insulin aspart  0-9 Units Subcutaneous 6 times per day  . pantoprazole (PROTONIX) IV  40 mg Intravenous QHS  . piperacillin-tazobactam (ZOSYN)  IV  3.375 [name] Intravenous Q8H  . vancomycin  1,000 mg Intravenous Q24H    . dextrose 5 % and 0.45% NaCl 70 mL/hr at 07/29/14 0300     Assessment/Plan: 1. Bronchopneumonia left  lower lobe-plan to continue current antibiotic regimen of vancomycin and Zosyn  2. Dementia altered mental status with previous CVA continue supportive care  3. Low serum potassium level we'll continue to replete   LOS: 9 days   [name] 07/30/2014, 7:38 AM

## 2014-07-30 NOTE — Progress Notes (Signed)
CRITICAL VALUE ALERT  Critical value received:  K 2.4  Date of notification:  07/30/14  Time of notification:  0912  Critical value read back:Yes.    Nurse who received alert:  Dagoberto LigasJessica Brittanya Winburn, RN  MD notified (1st page):  Dr. Renard MatterMcInnis  Time of first page:  0913  MD notified (2nd page): Dr. Ouida SillsFagan  Time of second page: 1015  Responding MD: Dr. Ouida SillsFagan  Time MD responded:  1015  New orders for 6 runs of KCL ordered. Will continue to monitor.

## 2014-07-30 NOTE — Progress Notes (Signed)
ANTIBIOTIC CONSULT NOTE  Pharmacy Consult for Vancomycin & Zosyn Indication: PNA  No Known Allergies  Patient Measurements: Height: 5\' 5"  (165.1 cm) Weight: 123 lb 3.2 oz (55.883 kg) IBW/kg (Calculated) : 57  Vital Signs: Temp: 98.6 F (37 C) (11/22 0647) Temp Source: Axillary (11/22 0647) BP: 142/68 mmHg (11/22 0647) Pulse Rate: [name] (11/22 0647) Intake/Output from previous day: 11/21 0701 - 11/22 0700 In: 940 [I.V.:840; IV Piggyback:100] Out: -  Intake/Output from this shift:    Labs:  Recent Labs  07/28/14 0627 07/29/14 0848 07/30/14 0838  CREATININE 0.49* 0.57 0.57   Estimated Creatinine Clearance: 38.8 mL/min (by C-G formula based on Cr of 0.57). No results for input(s): VANCOTROUGH, VANCOPEAK, VANCORANDOM, GENTTROUGH, GENTPEAK, GENTRANDOM, TOBRATROUGH, TOBRAPEAK, TOBRARND, AMIKACINPEAK, AMIKACINTROU, AMIKACIN in the last 72 hours.   Microbiology: Recent Results (from the past 720 hour(s))  Blood Culture (routine x 2)     Status: None   Collection Time: 07/21/14  1:11 PM  Result Value Ref Range Status   Specimen Description BLOOD RIGHT ARM DRAWN BY RN  Final   Special Requests BOTTLES DRAWN AEROBIC AND ANAEROBIC 4CC  Final   Culture NO GROWTH 5 DAYS  Final   Report Status 07/26/2014 FINAL  Final  Blood Culture (routine x 2)     Status: None   Collection Time: 07/21/14  1:15 PM  Result Value Ref Range Status   Specimen Description BLOOD LEFT WRIST  Final   Special Requests BOTTLES DRAWN AEROBIC ONLY 2CC  Final   Culture NO GROWTH 5 DAYS  Final   Report Status 07/26/2014 FINAL  Final  Urine culture     Status: None   Collection Time: 07/21/14  3:16 PM  Result Value Ref Range Status   Specimen Description URINE, CATHETERIZED  Final   Special Requests NONE  Final   Culture  Setup Time   Final    07/22/2014 00:31 Performed at [facility]    Colony Count NO GROWTH Performed at [facility]   Final   Culture NO GROWTH Performed at [facility]   Final   Report Status 07/23/2014 FINAL  Final  MRSA PCR Screening     Status: None   Collection Time: 07/21/14  5:01 PM  Result Value Ref Range Status   MRSA by PCR NEGATIVE NEGATIVE Final    Comment:        The GeneXpert MRSA Assay (FDA approved for NASAL specimens only), is one component of a comprehensive MRSA colonization surveillance program. It is not intended to diagnose MRSA infection nor to guide or monitor treatment for MRSA infections.     Anti-infectives    Start     Dose/Rate Route Frequency Ordered Stop   07/27/14 1800  vancomycin (VANCOCIN) IVPB 1000 mg/200 mL premix     1,000 mg200 mL/hr over 60 Minutes Intravenous Every 24 hours 07/27/14 0809     07/22/14 1400  vancomycin (VANCOCIN) IVPB 750 mg/150 ml premix  Status:  Discontinued     750 mg150 mL/hr over 60 Minutes Intravenous Every 24 hours 07/21/14 1701 07/27/14 0809   07/21/14 2000  piperacillin-tazobactam (ZOSYN) IVPB 3.375 g     3.375 g12.5 mL/hr over 240 Minutes Intravenous Every 8 hours 07/21/14 1701     07/21/14 1430  piperacillin-tazobactam (ZOSYN) IVPB 3.375 g  Status:  Discontinued     3.375 g100 mL/hr over 30 Minutes Intravenous  Once 07/21/14 1422 07/21/14 1607   07/21/14 1300  piperacillin-tazobactam (ZOSYN) IVPB 3.375  g     3.375 g100 mL/hr over 30 Minutes Intravenous  Once 07/21/14 1259 07/21/14 1407   07/21/14 1300  vancomycin (VANCOCIN) IVPB 1000 mg/200 mL premix     1,000 mg200 mL/hr over 60 Minutes Intravenous  Once 07/21/14 1259 07/21/14 1519      Assessment: [name]. Cx data NGTD. On day#9 empiric, broad-spectrum antibiotics. Renal function remains at patient's baseline.  Vancomycin trough slightly below goal.  Vancomycin 11/13>> Zosyn 11/13>>   Goal of Therapy:  Vancomycin trough level 15-20 mcg/ml  Plan:  Continue Zosyn 3.375gm IV Q8h to be infused over 4hrs Continue Vancomycin 1000mg  IV q24h Repeat Vancomycin  trough 11/25 if therapy to continue > 14 days. Monitor renal function and cx data  Duration of therapy per MD- consider d/c or de-escalate antibiotic. [name], [name] 07/30/2014,10:01 AM

## 2014-07-31 LAB — BASIC METABOLIC PANEL
ANION GAP: 12 (ref 5–15)
Anion gap: 11 (ref 5–15)
BUN: 4 mg/dL — ABNORMAL LOW (ref 6–23)
BUN: 5 mg/dL — ABNORMAL LOW (ref 6–23)
CALCIUM: 8.1 mg/dL — AB (ref 8.4–10.5)
CALCIUM: 7.9 mg/dL — AB (ref 8.4–10.5)
CO2: 27 mEq/L (ref 19–32)
CO2: 26 meq/L (ref 19–32)
Chloride: 106 mEq/L (ref 96–112)
Chloride: 105 mEq/L (ref 96–112)
Creatinine, Ser: 0.66 mg/dL (ref 0.50–1.10)
Creatinine, Ser: 0.65 mg/dL (ref 0.50–1.10)
GFR calc Af Amer: 86 mL/min — ABNORMAL LOW (ref >90)
GFR calc non Af Amer: 74 mL/min — ABNORMAL LOW (ref >90)
GFR, EST AFRICAN AMERICAN: 86 mL/min — AB (ref >90)
GFR, EST NON AFRICAN AMERICAN: 74 mL/min — AB (ref >90)
GLUCOSE: 166 mg/dL — AB (ref 70–99)
Glucose, Bld: 107 mg/dL — ABNORMAL HIGH (ref 70–99)
POTASSIUM: 3 meq/L — AB (ref 3.7–5.3)
POTASSIUM: 4 meq/L (ref 3.7–5.3)
SODIUM: 145 meq/L (ref 137–147)
Sodium: 142 mEq/L (ref 137–147)

## 2014-07-31 LAB — GLUCOSE, CAPILLARY
GLUCOSE-CAPILLARY: 118 mg/dL — AB (ref 70–99)
GLUCOSE-CAPILLARY: 135 mg/dL — AB (ref 70–99)
Glucose-Capillary: 98 mg/dL (ref 70–99)
Glucose-Capillary: 155 mg/dL — ABNORMAL HIGH (ref 70–99)
Glucose-Capillary: 193 mg/dL — ABNORMAL HIGH (ref 70–99)

## 2014-07-31 MED ORDER — POTASSIUM CHLORIDE 10 MEQ/100ML IV SOLN
10.0000 meq | INTRAVENOUS | Status: AC
  Administered 2014-07-31 (×3): 10 meq via INTRAVENOUS
  Filled 2014-07-31: qty 100

## 2014-07-31 MED ORDER — KCL IN DEXTROSE-NACL 20-5-0.45 MEQ/L-%-% IV SOLN
INTRAVENOUS | Status: DC
  Administered 2014-07-31 – 2014-08-02 (×5): via INTRAVENOUS

## 2014-07-31 NOTE — Evaluation (Signed)
Clinical/Bedside Swallow Evaluation Patient Details  Name: [name] MRN: [id number] DOB: 1919-08-06  Today's Date: 07/31/2014 Time: 4:35 PM  - 5:07 PM    Past Medical History:  Past Medical History  Diagnosis Date  . CVA (cerebral vascular accident)   . Hypertension   . Dementia   . Osteoporosis   . MI (myocardial infarction)   . COPD (chronic obstructive pulmonary disease)   . Systolic HF (heart failure)     5/14   Past Surgical History: History reviewed. No pertinent past surgical history. HPI:  [name] is a 80 y.o. female with a history of hypertension, COPD, dementia, cerebrovascular accident, left hip fracture, congestive heart failure. [facility]. She was brought to the hospital due to fever, tachycardia, hypoxia, tachypnea. Per the patient's son, he noted some increased weakness and decreased interaction in her yesterday compared to normal.  In the ER, noted that she was hypotensive the patient was given a liter bolus and started on empiric antibiotics. The patient is more awake and alert today. She is being treated for sepsis healthcare associated pneumonia and encephalopathy she has left lower lobe infiltrate which is slowly improving. She remains on IV vancomycin Zosyn. She has had low serum potassium levels and received 6 runs of potassium yesterday 10 mEq. Pt has had no nutrition by mouth since admission per RN. SLP requested to complete clinical swallow evaluation. No family present during SLP visit today.   Assessment/Recommendations/Treatment Plan    SLP Assessment  Clinical Impression Statement: Ms. [name] was seen at bedside for a clinical swallow evaluation. She was alert and smiled in response to SLP greeting and questions, however was nonverbal throughout the examination. Oral motor examination not formally completed due to decreased mentation; pt with own (but sparse) dentition. Pt opens her mouth in anticipation of food  presented via spoon. Oral prep and transit is delayed as well as pharyngeal swallow is delayed in initiation. [facility] Pt assessed with single small ice chip (pt sucked but did not swallow- likely not enough to stimulate swallow), Magic Cup, honey-thick liquids, and puree. Pt with mild lingual puree residuals. Pt with one episode of delayed coughing (weak and congested) over the course of trials. SLP attempted suctioning with no return orally. Pt is certainly at risk for aspiration due to decreased mentation, history of dysphagia with diet modifcations, weakness, delay in swallow initiation, and weak cough. Recommend conservative (baseline) diet of D1/puree with honey-thick liquids when pt is alert and upright. She will need 100% feeder assist and likely consume small meals at this time. Pt will continue to be at risk for aspiration despite diet modifications, however aspiration is also a risk with tube feeds. Pt shows a willingness and desire to eat by mouth. Will defer decision to Dr. Renard MatterMcInnis and follow up as needed.  Risk for Aspiration: Moderate Other Related Risk Factors: History of pneumonia, History of dysphagia, Previous CVA, Decreased respiratory status, Cognitive impairment  Swallow Evaluation Recommendations Diet Recommendations: Dysphagia 1 (Puree), Honey-thick liquid Liquid Administration via: Cup, Spoon Medication Administration: Crushed with puree Supervision: Staff to assist with self feeding, Full supervision/cueing for compensatory strategies Compensations: Slow rate, Small sips/bites, Check for pocketing, Multiple dry swallows after each bite/sip Postural Changes and/or Swallow Maneuvers: Upright 30-60 min after meal, Seated upright 90 degrees Oral Care Recommendations: Oral care before and after PO, Staff/trained caregiver to provide oral care Other  Recommendations: Order thickener from pharmacy, Prohibited food (jello, ice  cream, thin soups), Remove water pitcher, Have oral suction available, Clarify dietary restrictions Follow up Recommendations: Skilled Nursing facility  Treatment Plan Treatment Plan Recommendations: Therapy as outlined in treatment plan below Speech Therapy Frequency (ACUTE ONLY): min 2x/week Treatment Duration: 1 week Interventions: Aspiration precaution training, Compensatory techniques, Patient/family education, Diet toleration management by SLP  Prognosis Prognosis for Safe Diet Advancement: Fair Barriers to Reach Goals: Cognitive deficits  Individuals Consulted Consulted and Agree with Results and Recommendations: Patient unable/family or caregiver not available, RN  Swallowing Goals    Pt will demonstrate safe and efficient consumption of least restrictive diet with use of strategies as needed.   Swallow Study Prior Functional Status   [place] [facility]  [place]  Date of Onset: 07/21/14 HPI: [name] is a 80 y.o. female with a history of hypertension, COPD, dementia, cerebrovascular accident, left hip fracture, congestive heart failure. She is a [facility]. She was brought to the hospital due to fever, tachycardia, hypoxia, tachypnea. Per the patient's son, he noted some increased weakness and decreased interaction in her yesterday compared to normal.  In the ER, noted that she was hypotensive the patient was given a liter bolus and started on empiric antibiotics. The patient is more awake and alert today. She is being treated for sepsis healthcare associated pneumonia and encephalopathy she has left lower lobe infiltrate which is slowly improving. She remains on IV vancomycin Zosyn. She has had low serum potassium levels and received 6 runs of potassium yesterday 10 mEq. Pt has had no nutrition by mouth since admission per RN. SLP requested to complete clinical swallow evaluation. No family present during SLP visit today. Type of Study: Bedside swallow  evaluation Previous Swallow Assessment: 2012 MBSS NTL Diet Prior to this Study: NPO ([facility]) Temperature Spikes Noted: No Respiratory Status: Nasal cannula History of Recent Intubation: No Behavior/Cognition: Alert, Cooperative, Pleasant mood, Doesn't follow directions Oral Cavity - Dentition: Poor condition Self-Feeding Abilities: Needs assist Patient Positioning: Upright in bed Baseline Vocal Quality:  (nonverbal (audible breath)) Volitional Cough: Cognitively unable to elicit (spontaneous cough is weak with congestion) Volitional Swallow: Unable to elicit  Oral Motor/Sensory Function  Overall Oral Motor/Sensory Function: Impaired at baseline (difficult to formally assess due to decreased mentation) Labial ROM: Within Functional Limits Labial Symmetry: Within Functional Limits Labial Strength: Reduced Labial Sensation: Within Functional Limits Lingual ROM: Within Functional Limits Lingual Symmetry: Within Functional Limits Lingual Strength: Reduced Lingual Sensation: Reduced Facial ROM: Within Functional Limits Facial Symmetry: Within Functional Limits Facial Strength: Reduced Facial Sensation: Within Functional Limits Velum:  (could not evaluate) Mandible: Within Functional Limits  Consistency Results  Ice Chips Ice chips: Impaired Presentation: Spoon Oral Phase Impairments: Reduced lingual movement/coordination;Impaired anterior to posterior transit Other Comments: only tiny ice chip presented, not swallowed  Thin Liquid Thin Liquid: Not tested  Nectar Thick Liquid Nectar Thick Liquid: Not tested  Honey Thick Liquid Honey Thick Liquid: Impaired Presentation: Cup;Spoon Pharyngeal Phase Impairments: Suspected delayed Swallow;Multiple swallows;Cough - Delayed  Puree Puree: Impaired Presentation: Spoon Oral Phase Impairments: Reduced lingual movement/coordination;Impaired anterior to posterior transit Oral Phase Functional Implications:  Prolonged oral transit;Oral residue Pharyngeal Phase Impairments: Suspected delayed Swallow;Multiple swallows  Solid Solid: Not tested  Thank you,  Havery MorosDabney Nino Amano, CCC-SLP [phone]  Sondos Wolfman 07/31/2014,5:13 PM

## 2014-07-31 NOTE — Progress Notes (Signed)
NTSX patient obtained a large amount of thick yellow secretions. Patient did well. Some nasal trama slight bleeding from nose. Only pink. Will try again later hopefully this will allow her to cough more as she was not before. She cannot swallow with out stimuli. Neb given after.

## 2014-07-31 NOTE — Consult Note (Signed)
WOC wound consult note Reason for Consult: Multiple pressure ulcers, redness to buttocks and perineum, consistent with fungal overgrowth.  Wound type: Pressure ulcer, present on admission, candida rash to perineum and buttocks. Patient is NPO since 07/21/14 Pressure Ulcer POA: Yes Measurement: Left 5th toe 1 cm x 1 cm x 0.1 cm 50% eschar present Left buttock 1.5 cm x 0.8 cm x 0.1 cm partial thickness, Stage II pressure ulcer Generalized erythema to buttocks, sacrum and perineum.  Satellite lesions present, consistent with fungal overgrowth.  Right heel:  1.5 cm x 1 cm x 0.1 cm  Right great toe 0.5 cm x 0.5 cm x 0.1 cm Resolved erythema (Stage I) to left elbow Wound bed: clean, pink, moist.  Left toe with 50% eschar Drainage (amount, consistency, odor) Minimal, serosanguinous drainage.  No odor.  Periwound: Intact Dressing procedure/placement/frequency: Cleanse pressure ulcers to: Right heel Right great toe Left 5 th toe Left buttock  **with NS and pat gently dry.  Apply Allevyn silicone bordered foam.  Change every 3 days and PRN soilage from drainage or incontinence. Bedside RN to contact MD regarding fungal overgrowth and need for prescriptive treatment.  Conservative sharp wound debridement (CSWD performed at the bedside): n/a Will not follow at this time.  Please re-consult if needed.  Maple HudsonKaren Lennan Malone RN BSN CWON Pager [phone]

## 2014-07-31 NOTE — Progress Notes (Signed)
Subjective: The patient is awake and alone more oriented today. She is being treated for sepsis healthcare associated pneumonia and encephalopathy she has left lower lobe infiltrate which is slowly improving. She remains on IV vancomycin Zosyn. She has had low serum potassium levels and received 6 runs of potassium yesterday 10 mEq Objective: Vital signs in last 24 hours: Temp:  [97.9 F (36.6 C)-98.6 F (37 C)] 97.9 F (36.6 C) (11/22 2105) Pulse Rate:  [90-94] 90 (11/22 2105) Resp:  [16-18] 16 (11/22 2105) BP: (116-142)/(56-86) 129/56 mmHg (11/22 2105) SpO2:  [93 %-98 %] 93 % (11/22 2105) Weight:  [54.432 kg (120 lb)] 54.432 kg (120 lb) (11/23 0413) Weight change: -1.452 kg (-3 lb 3.2 oz) Last BM Date: 07/30/14  Intake/Output from previous day: 11/22 0701 - 11/23 0700 In: 700 [IV Piggyback:700] Out: -  Intake/Output this shift: Total I/O In: 100 [IV Piggyback:100] Out: -   Physical Exam: Gen. appearance the patient is more alert  H EENT negative  Neck supple no JVD or thyroid abnormalities  Heart regular rhythm no murmurs  Lungs rales or rhonchi heard over lower lung field  Abdomen no palpable organs or masses  Skin warm and dry bruise on right lower leg  No results for input(s): WBC, HGB, HCT, PLT in the last 72 hours. BMET  Recent Labs  07/29/14 0848 07/30/14 0838  NA 146 144  K 3.0* 2.4*  CL 106 106  CO2 29 27  GLUCOSE 140* 134*  BUN 4* 4*  CREATININE 0.57 0.57  CALCIUM 8.0* 7.8*    Studies/Results: No results found.  Medications:  . antiseptic oral rinse  7 mL Mouth Rinse q12n4p  . chlorhexidine  15 mL Mouth Rinse BID  . enoxaparin (LOVENOX) injection  40 mg Subcutaneous Q24H  . insulin aspart  0-9 Units Subcutaneous 6 times per day  . pantoprazole (PROTONIX) IV  40 mg Intravenous QHS  . piperacillin-tazobactam (ZOSYN)  IV  3.375 g Intravenous Q8H  . vancomycin  1,000 mg Intravenous Q24H    . dextrose 5 % and 0.45% NaCl 70 mL/hr at 07/29/14  0300     Assessment/Plan: 1. Bronchopneumonia left lower lobe-plan to continue current antibiotic regimen of vancomycin and Zosyn  2. Dementia altered mental status with previous CVA-continue supportive care  3. Low serum potassium levels will continue to replete we'll recheck chemistries today  4. Increase IV fluids 200 mL an hour-start by mouth feedings   LOS: 10 days   Judiann Celia G 07/31/2014, 6:04 AM

## 2014-07-31 NOTE — Plan of Care (Signed)
Problem: Phase I Progression Outcomes Goal: Hemodynamically stable Outcome: Completed/Met Date Met:  07/31/14

## 2014-07-31 NOTE — Progress Notes (Signed)
Patient more lethargic post speech eval. Patient orally suctioned and thick secretions obtained.  Will continue to monitor

## 2014-08-01 ENCOUNTER — Inpatient Hospital Stay (HOSPITAL_COMMUNITY): Payer: Medicare PPO

## 2014-08-01 LAB — GLUCOSE, CAPILLARY
GLUCOSE-CAPILLARY: 119 mg/dL — AB (ref 70–99)
GLUCOSE-CAPILLARY: 147 mg/dL — AB (ref 70–99)
Glucose-Capillary: 95 mg/dL (ref 70–99)
Glucose-Capillary: 157 mg/dL — ABNORMAL HIGH (ref 70–99)
Glucose-Capillary: 134 mg/dL — ABNORMAL HIGH (ref 70–99)
Glucose-Capillary: 135 mg/dL — ABNORMAL HIGH (ref 70–99)

## 2014-08-01 LAB — BASIC METABOLIC PANEL
ANION GAP: 10 (ref 5–15)
BUN: 6 mg/dL (ref 6–23)
CALCIUM: 7.9 mg/dL — AB (ref 8.4–10.5)
CO2: 27 mEq/L (ref 19–32)
Chloride: 105 mEq/L (ref 96–112)
Creatinine, Ser: 0.67 mg/dL (ref 0.50–1.10)
GFR calc non Af Amer: 73 mL/min — ABNORMAL LOW (ref >90)
GFR, EST AFRICAN AMERICAN: 85 mL/min — AB (ref >90)
Glucose, Bld: 86 mg/dL (ref 70–99)
POTASSIUM: 3.3 meq/L — AB (ref 3.7–5.3)
SODIUM: 142 meq/L (ref 137–147)

## 2014-08-01 MED ORDER — PRO-STAT SUGAR FREE PO LIQD
30.0000 mL | Freq: Three times a day (TID) | ORAL | Status: DC
  Administered 2014-08-01 – 2014-08-03 (×6): 30 mL via ORAL
  Filled 2014-08-01 (×6): qty 30

## 2014-08-01 MED ORDER — NYSTATIN-TRIAMCINOLONE 100000-0.1 UNIT/GM-% EX CREA
TOPICAL_CREAM | Freq: Two times a day (BID) | CUTANEOUS | Status: DC
  Administered 2014-08-01 – 2014-08-03 (×5): via TOPICAL
  Filled 2014-08-01 (×3): qty 15

## 2014-08-01 NOTE — Progress Notes (Signed)
Subjective: The patient was more alert yesterday. She did have swallowing study done and did fair with this. She is being treated for sepsis up care associated pneumonia and encephalopathy and has left lower lobe infiltrate which is slowly improving. She remains on IV vancomycin and Zosyn she has has low serum potassium levels and has received potassium and last potassium was 4.0  Objective: Vital signs in last 24 hours: Temp:  [97.7 F (36.5 C)-98 F (36.7 C)] 98 F (36.7 C) (11/24 0513) Pulse Rate:  [86-98] 87 (11/24 0513) Resp:  [18-20] 20 (11/24 0513) BP: (124-129)/(54-64) 124/54 mmHg (11/24 0513) SpO2:  [96 %-99 %] 99 % (11/24 0513) Weight:  [54.021 kg (119 lb 1.5 oz)] 54.021 kg (119 lb 1.5 oz) (11/24 0513) Weight change: -0.411 kg (-14.5 oz) Last BM Date: 07/31/14  Intake/Output from previous day:   Intake/Output this shift:    Physical Exam: Gen. appearance the patient is lethargic  HEENT negative  Neck supple no JVD or thyroid abnormalities  Heart regular rhythm no murmurs  Lungs rales or rhonchi heard over lower lung field  Abdomen the palpable organs or masses  Skin warm and dry  No results for input(s): WBC, HGB, HCT, PLT in the last 72 hours. BMET  Recent Labs  07/31/14 0549 07/31/14 1410  NA 145 142  K 3.0* 4.0  CL 106 105  CO2 27 26  GLUCOSE 107* 166*  BUN 4* 5*  CREATININE 0.66 0.65  CALCIUM 8.1* 7.9*    Studies/Results: No results found.  Medications:  . antiseptic oral rinse  7 mL Mouth Rinse q12n4p  . chlorhexidine  15 mL Mouth Rinse BID  . enoxaparin (LOVENOX) injection  40 mg Subcutaneous Q24H  . insulin aspart  0-9 Units Subcutaneous 6 times per day  . pantoprazole (PROTONIX) IV  40 mg Intravenous QHS  . piperacillin-tazobactam (ZOSYN)  IV  3.375 g Intravenous Q8H  . vancomycin  1,000 mg Intravenous Q24H    . dextrose 5 % and 0.45 % NaCl with KCl 20 mEq/L 100 mL/hr at 08/01/14 0338     Assessment/Plan: 1. Bronchopneumonia left  lower lobe-plan to continue current antibiotic regimen vancomycin and Zosyn  2. Dementia altered mental status due to previous CVA-continue supportive care  3. Low serum potassium levels will recheck chemistries today and replete if necessary  Will attempt feedings by mouth today-recheck her serum potassium   LOS: 11 days   Tejuan Gholson G 08/01/2014, 6:11 AM

## 2014-08-01 NOTE — Progress Notes (Addendum)
NUTRITION FOLLOW UP  Pt meets criteria for severe MALNUTRITION in the context of chronic illness as evidenced by moderate to severe fat and muscle depletion, 11.6% wt loss x 6 months.  Intervention:   -Magic Cup BID with meals -30 ml Prostat TID -RD to follow for diet advancement and goals of care  Nutrition Dx:   Inadequate oral intake now related to lethargy, dysphagia as evidenced by PO: 0%; ongoing  Goal:   Pt will meet >90% of estimated nutritional needs; goal not met  Monitor:   Diet advancement, PO/supplement intake, labs, weight changes, I/O's, goals of care  Assessment:   [name] is a 80 y.o. female with a history of hypertension, COPD, dementia, cerebrovascular accident, left hip fracture, congestive heart failure. She is a resident of [facility]. She was brought to the hospital due to fever, tachycardia, hypoxia, tachypnea. Per the patient's son, he noted some increased weakness and decreased interaction in her yesterday compared to normal.   Pt is currently on a full liquid diet. Intake remains poor due to decreased mentation. RN reports pt did not tolerate any liquids or foods yesterday.  SLP evaluated on 07/31/14- noted at risk for aspiration due to decreased mentation. MD reports he would like to start pt on a diet today to observe tolerance. Per MD notes, pt family is not ready to accept comfort care measures.  Lab reviewed. K: 3.3, Calcium: 7.9. CBGS: 95-147.  Height: Ht Readings from Last 1 Encounters:  07/21/14 5' 5"  (1.651 m)    Weight Status:   Wt Readings from Last 1 Encounters:  08/01/14 119 lb 1.5 oz (54.021 kg)   07/27/14 123 lb 10.9 oz (56.1 kg)   Re-estimated needs:  Kcal: 1800-2000 Protein: 81-91 grams Fluid: 1.8-2.0 L  Skin: stage II pressure ulcers on buttocks, coccyx lt lateral foot; unstageable pressure ulcer on rt toe; DTI on rt heel  Diet Order: Diet full liquid   Intake/Output Summary (Last 24 hours) at 08/01/14 1110 Last  data filed at 07/31/14 1700  Gross per 24 hour  Intake      0 ml  Output      0 ml  Net      0 ml    Last BM: 07/31/14   Labs:   Recent Labs Lab 07/31/14 0549 07/31/14 1410 08/01/14 0757  NA 145 142 142  K 3.0* 4.0 3.3*  CL 106 105 105  CO2 27 26 27   BUN 4* 5* 6  CREATININE 0.66 0.65 0.67  CALCIUM 8.1* 7.9* 7.9*  GLUCOSE 107* 166* 86    CBG (last 3)   Recent Labs  08/01/14 0108 08/01/14 0512 08/01/14 0725  GLUCAP 119* 147* 95    Scheduled Meds: . antiseptic oral rinse  7 mL Mouth Rinse q12n4p  . chlorhexidine  15 mL Mouth Rinse BID  . enoxaparin (LOVENOX) injection  40 mg Subcutaneous Q24H  . insulin aspart  0-9 Units Subcutaneous 6 times per day  . nystatin-triamcinolone   Topical BID  . pantoprazole (PROTONIX) IV  40 mg Intravenous QHS  . piperacillin-tazobactam (ZOSYN)  IV  3.375 g Intravenous Q8H  . vancomycin  1,000 mg Intravenous Q24H    Continuous Infusions: . dextrose 5 % and 0.45 % NaCl with KCl 20 mEq/L 100 mL/hr at 08/01/14 0338    Pama Roskos A. Jimmye Norman, RD, LDN Pager: [phone]

## 2014-08-01 NOTE — Progress Notes (Addendum)
I expressed to dr. Renard MatterMcinnis this morning that the patient did not tolerate any liquids or foods. He says he would like to start her on a diet and try to feed her today if she tolerates its. I also mentioned the fungus growing around her groin and he ordered cream to be placed twice a day.  Bennett ScrapeAlicia Wright RN

## 2014-08-01 NOTE — Progress Notes (Signed)
Dr. Renard MatterMcinnis called to check on patient. Explained to Dr. Renard MatterMcinnis that the patient has ate breakfast and lunch, tolerated well. Also informed him that the wound nurse recommended diflucan. New orders for BMET in am, chest xray today, and to continue Nystatin cream.

## 2014-08-02 LAB — BASIC METABOLIC PANEL
ANION GAP: 11 (ref 5–15)
BUN: 8 mg/dL (ref 6–23)
CHLORIDE: 105 meq/L (ref 96–112)
CO2: 26 meq/L (ref 19–32)
Calcium: 7.9 mg/dL — ABNORMAL LOW (ref 8.4–10.5)
Creatinine, Ser: 0.65 mg/dL (ref 0.50–1.10)
GFR calc non Af Amer: 74 mL/min — ABNORMAL LOW (ref >90)
GFR, EST AFRICAN AMERICAN: 86 mL/min — AB (ref >90)
Glucose, Bld: 157 mg/dL — ABNORMAL HIGH (ref 70–99)
Potassium: 3.6 mEq/L — ABNORMAL LOW (ref 3.7–5.3)
SODIUM: 142 meq/L (ref 137–147)

## 2014-08-02 LAB — VANCOMYCIN, TROUGH: Vancomycin Tr: 21.4 ug/mL — ABNORMAL HIGH (ref 10.0–20.0)

## 2014-08-02 LAB — GLUCOSE, CAPILLARY
GLUCOSE-CAPILLARY: 130 mg/dL — AB (ref 70–99)
GLUCOSE-CAPILLARY: 137 mg/dL — AB (ref 70–99)
GLUCOSE-CAPILLARY: 142 mg/dL — AB (ref 70–99)
GLUCOSE-CAPILLARY: 269 mg/dL — AB (ref 70–99)
GLUCOSE-CAPILLARY: 94 mg/dL (ref 70–99)
Glucose-Capillary: 166 mg/dL — ABNORMAL HIGH (ref 70–99)

## 2014-08-02 NOTE — Progress Notes (Signed)
ANTIBIOTIC CONSULT NOTE  Pharmacy Consult for Vancomycin & Zosyn Indication: PNA  No Known Allergies  Patient Measurements: Height: 5\' 5"  (165.1 cm) Weight: 117 lb 15.5 oz (53.51 kg) IBW/kg (Calculated) : 57  Vital Signs: Temp: 98.1 F (36.7 C) (11/25 0430) Temp Source: Axillary (11/25 0430) BP: 124/52 mmHg (11/25 0430) Pulse Rate: 87 (11/25 0430) Intake/Output from previous day: 11/24 0701 - 11/25 0700 In: 3706.7 [P.O.:720; I.V.:2736.7; IV Piggyback:250] Out: -  Intake/Output from this shift:    Labs:  Recent Labs  07/31/14 1410 08/01/14 0757 08/02/14 0551  CREATININE 0.65 0.67 0.65   Estimated Creatinine Clearance: 37.1 mL/min (by C-G formula based on Cr of 0.65). No results for input(s): VANCOTROUGH, VANCOPEAK, VANCORANDOM, GENTTROUGH, GENTPEAK, GENTRANDOM, TOBRATROUGH, TOBRAPEAK, TOBRARND, AMIKACINPEAK, AMIKACINTROU, AMIKACIN in the last 72 hours.   Microbiology: Recent Results (from the past 720 hour(s))  Blood Culture (routine x 2)     Status: None   Collection Time: 07/21/14  1:11 PM  Result Value Ref Range Status   Specimen Description BLOOD RIGHT ARM DRAWN BY RN  Final   Special Requests BOTTLES DRAWN AEROBIC AND ANAEROBIC 4CC  Final   Culture NO GROWTH 5 DAYS  Final   Report Status 07/26/2014 FINAL  Final  Blood Culture (routine x 2)     Status: None   Collection Time: 07/21/14  1:15 PM  Result Value Ref Range Status   Specimen Description BLOOD LEFT WRIST  Final   Special Requests BOTTLES DRAWN AEROBIC ONLY 2CC  Final   Culture NO GROWTH 5 DAYS  Final   Report Status 07/26/2014 FINAL  Final  Urine culture     Status: None   Collection Time: 07/21/14  3:16 PM  Result Value Ref Range Status   Specimen Description URINE, CATHETERIZED  Final   Special Requests NONE  Final   Culture  Setup Time   Final    07/22/2014 00:31 Performed at [facility]    Colony Count NO GROWTH Performed at [facility]   Final   Culture NO  GROWTH Performed at [facility]   Final   Report Status 07/23/2014 FINAL  Final  MRSA PCR Screening     Status: None   Collection Time: 07/21/14  5:01 PM  Result Value Ref Range Status   MRSA by PCR NEGATIVE NEGATIVE Final    Comment:        The GeneXpert MRSA Assay (FDA approved for NASAL specimens only), is one component of a comprehensive MRSA colonization surveillance program. It is not intended to diagnose MRSA infection nor to guide or monitor treatment for MRSA infections.     Anti-infectives    Start     Dose/Rate Route Frequency Ordered Stop   07/27/14 1800  vancomycin (VANCOCIN) IVPB 1000 mg/200 mL premix     1,000 mg200 mL/hr over 60 Minutes Intravenous Every 24 hours 07/27/14 0809     07/22/14 1400  vancomycin (VANCOCIN) IVPB 750 mg/150 ml premix  Status:  Discontinued     750 mg150 mL/hr over 60 Minutes Intravenous Every 24 hours 07/21/14 1701 07/27/14 0809   07/21/14 2000  piperacillin-tazobactam (ZOSYN) IVPB 3.375 g     3.375 g12.5 mL/hr over 240 Minutes Intravenous Every 8 hours 07/21/14 1701     07/21/14 1430  piperacillin-tazobactam (ZOSYN) IVPB 3.375 g  Status:  Discontinued     3.375 g100 mL/hr over 30 Minutes Intravenous  Once 07/21/14 1422 07/21/14 1607   07/21/14 1300  piperacillin-tazobactam (ZOSYN) IVPB  3.375 g     3.375 g100 mL/hr over 30 Minutes Intravenous  Once 07/21/14 1259 07/21/14 1407   07/21/14 1300  vancomycin (VANCOCIN) IVPB 1000 mg/200 mL premix     1,000 mg200 mL/hr over 60 Minutes Intravenous  Once 07/21/14 1259 07/21/14 1519      Assessment: 89 [facility]. Cx data NGTD. On day#11 empiric, broad-spectrum antibiotics. Renal function remains at patient's baseline.  Vancomycin dose adjusted 11/22 for low trough level.  CXR + worsening LLL PNA and new LUL PNA.  Vancomycin 11/13>> Zosyn 11/13>>   Goal of Therapy:  Vancomycin trough level 15-20 mcg/ml  Plan:  Continue Zosyn 3.375gm IV  Q8h to be infused over 4hrs Continue Vancomycin 1000mg  IV q24h Weekly Vancomycin trough today Monitor renal function and cx data  Duration of therapy per MD- consider d/c or de-escalate antibiotic or change therapy if patient is not clinically improving.  [name], Addysen Louth Michelle 08/02/2014,2:53 PM

## 2014-08-02 NOTE — Progress Notes (Signed)
Subjective:  The patient is more alert. She is eating and drinking some yesterday. She is being treated for sepsis pneumonia and encephalopathy in left lower lobe infiltrate which persist. She remains on IV vancomycin and Zosyn. She continues to have low serum potassium levels. This has been repleted  Objective: Vital signs in last 24 hours: Temp:  [97.7 F (36.5 C)-98.1 F (36.7 C)] 98.1 F (36.7 C) (11/25 0430) Pulse Rate:  [87-94] 87 (11/25 0430) Resp:  [20] 20 (11/25 0430) BP: (124-129)/(42-52) 124/52 mmHg (11/25 0430) SpO2:  [93 %-97 %] 96 % (11/25 0430) Weight:  [53.51 kg (117 lb 15.5 oz)] 53.51 kg (117 lb 15.5 oz) (11/25 0430) Weight change: -0.511 kg (-1 lb 2 oz) Last BM Date: 07/31/14  Intake/Output from previous day: 11/24 0701 - 11/25 0700 In: 2455 [P.O.:720; I.V.:1485; IV Piggyback:250] Out: -  Intake/Output this shift: Total I/O In: 120 [P.O.:120] Out: -   Physical Exam: Gen. appearance the patient is lethargic  HEENT negative  Neck supple no JVD or thyroid abnormalities  Heart regular rhythm no murmurs  Lungs Rales in both lower lung fields  Abdomen no palpable organs or masses  Skin warm and dry  No results for input(s): WBC, HGB, HCT, PLT in the last 72 hours. BMET  Recent Labs  07/31/14 1410 08/01/14 0757  NA 142 142  K 4.0 3.3*  CL 105 105  CO2 26 27  GLUCOSE 166* 86  BUN 5* 6  CREATININE 0.65 0.67  CALCIUM 7.9* 7.9*    Studies/Results: Dg Chest Port 1 View  08/01/2014   CLINICAL DATA:  80 year old, septic, pneumonia, left lower lobe infiltrate  EXAM: PORTABLE CHEST - 1 VIEW  COMPARISON:  07/25/2014  FINDINGS: The cardiac silhouette and mediastinal contours are unchanged.  The patient is slightly rotated.  Left lower lobe consolidation persists. Patchy areas of consolidation within the left upper lobe are now present. Diffuse interstitial prominence is unchanged bilaterally. There is no right pleural effusion. There is no pneumothorax.   The osseous structures are unchanged.  IMPRESSION: Slight interval progression of left lower lobe pneumonia/effusion. Probable developing left upper lobe pneumonitis.   Electronically Signed   By: Fannie KneeKenneth  Crosby   On: 08/01/2014 15:35    Medications:  . antiseptic oral rinse  7 mL Mouth Rinse q12n4p  . chlorhexidine  15 mL Mouth Rinse BID  . enoxaparin (LOVENOX) injection  40 mg Subcutaneous Q24H  . feeding supplement (PRO-STAT SUGAR FREE 64)  30 mL Oral TID WC  . insulin aspart  0-9 Units Subcutaneous 6 times per day  . nystatin-triamcinolone   Topical BID  . pantoprazole (PROTONIX) IV  40 mg Intravenous QHS  . piperacillin-tazobactam (ZOSYN)  IV  3.375 g Intravenous Q8H  . vancomycin  1,000 mg Intravenous Q24H    . dextrose 5 % and 0.45 % NaCl with KCl 20 mEq/L 100 mL/hr at 08/01/14 2357     Assessment/Plan: 1. Bronchopneumonia left lower lobe left upper lobe-plan to continue current antibiotic regimen vancomycin and Zosyn  2. Dementia altered mental status secondary to previous CVA-continue supportive care  3. Low serum potassium levels will recheck today and replete if necessary   LOS: 12 days   Mahad Newstrom G 08/02/2014, 6:33 AM

## 2014-08-02 NOTE — Patient Instructions (Signed)
D1/puree with HTL when alert and upright Liquids provided via: Cup;Teaspoon Medication Administration: Crushed with puree Supervision: Staff to assist with self feeding;Full supervision/cueing for compensatory strategies Compensations: Slow rate;Small sips/bites;Check for pocketing;Multiple dry swallows after each bite/sip;Follow solids with liquid Postural Changes and/or Swallow Maneuvers: Upright 30-60 min after meal;Seated upright 90 degrees

## 2014-08-02 NOTE — Progress Notes (Signed)
Speech Language Pathology Treatment:    Patient Details Name: [name] Evenernnie P Tremain MRN: [id number] DOB: [birth year] Today's Date: 08/02/2014 Time: 1:15 PM  - 1:40 PM    Assessment / Plan / Recommendation Clinical Impression  Mrs. Newill was seen by SLP on 07/31/2014 for clinical swallow evaluation with recommendation for puree diet with honey-thick liquids. She was unfortunately started on a full liquid diet and given thin liquids yesterday and today. When SLP entered room, her lunch tray had thin soda, tea, water, and juice. Tech confirms that she consumed thin liquids at breakfast. Repeat chest Xray yesterday shows: Slight interval progression of left lower lobe pneumonia/effusion. Probable developing left upper lobe pneumonitis.   Pt's diet PTA was NAS with honey-thick liquids, Magic Cup BID and 30 ml Prostat BID dor nutrition support. Pt with weight loss history of 11.5% weight loss in the past 6 months. Last objective study (MBSS) was completed September 2012 showing trace aspiration of thins after the swallow when taking barium tablet. Pt currently inappropriate for objective study due to decreased alertness.   Upon SLP arrival today, Mrs. [name] is sleeping soundly and difficult to rouse. She awoke briefly after positioned as upright as possible and opened her mouth when spoon presented. Swallow initiation is delayed and pt requires moderate cues for alertness. She does not follow directions for a cued repeat swallow, but does initiate a second swallow when presented with a coated spoon to help stimulate. Swallow initiation became more delayed over course of trials due to decreased alertness. Congested, delayed cough after puree presentations. Pt continues to be at risk for aspiration due to decreased mentation and decreased alertness. Given that pt was presented with thin liquids yesterday and today, it is difficult to assess whether congestion is related to current po trials. Should family wish  to have pt eat by mouth, recommend puree with HONEY THICK liquids to minimize (but not eliminate) risk for aspiration. Feed pt only when alert and upright; discontinue feeds if pt becomes visibly more fatigued or is coughing. Recommend SLP to f/u at next venue of care. Above to RN.     HPI HPI: [name] is a 80 y.o. female with a history of hypertension, COPD, dementia, cerebrovascular accident, left hip fracture, congestive heart failure. [facility] is a [facility]. She was brought to the hospital due to fever, tachycardia, hypoxia, tachypnea. Per the patient's son, he noted some increased weakness and decreased interaction in her yesterday compared to normal.  In the ER, noted that she was hypotensive the patient was given a liter bolus and started on empiric antibiotics. The patient is more awake and alert today. She is being treated for sepsis healthcare associated pneumonia and encephalopathy she has left lower lobe infiltrate which is slowly improving. She remains on IV vancomycin Zosyn. She has had low serum potassium levels and received 6 runs of potassium yesterday 10 mEq. Pt has had no nutrition by mouth since admission per RN. SLP requested to complete clinical swallow evaluation. No family present during SLP visit today.   Pertinent Vitals  O2 via nasal canula  SLP Plan  Continue with current plan of care    Recommendations Liquids provided via: Cup;Teaspoon Medication Administration: Crushed with puree Supervision: Staff to assist with self feeding;Full supervision/cueing for compensatory strategies Compensations: Slow rate;Small sips/bites;Check for pocketing;Multiple dry swallows after each bite/sip;Follow solids with liquid Postural Changes and/or Swallow Maneuvers: Upright 30-60 min after meal;Seated upright 90 degrees  Oral Care Recommendations: Oral care before and after PO;Staff/trained caregiver to provide oral care Plan: Continue with current plan of  care   Thank you,  Havery MorosDabney Porter, CCC-SLP [phone]      PORTER,DABNEY 08/02/2014, 2:01 PM

## 2014-08-02 NOTE — Progress Notes (Signed)
Hospitalist called back and asked me to contact Dr. Renard MatterMcInnis. MD paged. Awaiting phone call back.

## 2014-08-02 NOTE — Progress Notes (Addendum)
Notified MD, pt right upper extremity is severely swollen in the hand and right upper arm. IV fluids stopped. IV removed. Will continue to monitor pt.

## 2014-08-02 NOTE — Clinical Social Work Note (Signed)
CSW contacted patient's son, [name].  CSW provided a status update. CSW advised Mr. [name] that Dr. Renard MatterMcInnis had requested that he contact his office. Mr. [name] advised that he was going to go to Dr. Renard MatterMcInnis' office this morning to speak with him regarding patient.  Tretha SciaraHeather Cheyna Retana, LCSW [phone]

## 2014-08-02 NOTE — Clinical Social Work Note (Addendum)
CSW spoke with patients son, Jefferey PicaCharles Pautler. Mr. [name] indicated that he had spoken to Dr. Renard MatterMcInnis who indicated that [facility] would be [facility].   CSW spoke with Dr. Renard MatterMcInnis who indicated that he was planning on discharging patient tomorrow.  CSW [facility] indicating that Dr. Renard MatterMcInnis planned on discharging patient back to facility tomorrow.   Tretha SciaraHeather Zakkary Thibault, Kentucky[place]CS[place] [phone]

## 2014-08-03 LAB — GLUCOSE, CAPILLARY
Glucose-Capillary: 114 mg/dL — ABNORMAL HIGH (ref 70–99)
Glucose-Capillary: 116 mg/dL — ABNORMAL HIGH (ref 70–99)
Glucose-Capillary: 160 mg/dL — ABNORMAL HIGH (ref 70–99)

## 2014-08-03 MED ORDER — PRO-STAT SUGAR FREE PO LIQD: 30.0000 mL | Freq: Three times a day (TID) | ORAL | Status: AC

## 2014-08-03 MED ORDER — VANCOMYCIN HCL IN DEXTROSE 750-5 MG/150ML-% IV SOLN
750.0000 mg | INTRAVENOUS | Status: DC
  Filled 2014-08-03 (×2): qty 150

## 2014-08-03 MED ORDER — NYSTATIN-TRIAMCINOLONE 100000-0.1 UNIT/GM-% EX CREA: TOPICAL_CREAM | Freq: Two times a day (BID) | CUTANEOUS | Status: AC

## 2014-08-03 NOTE — Progress Notes (Signed)
Report called to [facility]. 

## 2014-08-03 NOTE — Discharge Summary (Signed)
Physician Discharge Summary  [name] ZOX:[id number] DOB: [birth year] DOA: 07/21/2014  PCP: [name]  Admit date: 07/21/2014 Discharge date: 08/03/2014     Discharge Diagnoses:  1. Sepsis 2. Healthcare associated pneumonia left lower lobe with left lower lobe collapse and consolidation 3. Encephalopathy-dementia 4. Dehydration 5. Prior CVA and left hip fracture 6. CHF systolic 7. Essential hypertension 8. Protein calorie malnutrition      Discharge Condition: Stable Disposition: Transfer to nursing facility  Diet recommendation: Soft diet liquids pro-stat feeding supplement  Filed Weights   08/01/14 0513 08/02/14 0430 08/03/14 0628  Weight: 54.021 kg (119 lb 1.5 oz) 53.51 kg (117 lb 15.5 oz) 55.021 kg (121 lb 4.8 oz)    History of present illness:  The patient resides in a local nursing facility. She is brought into the emergency room initially with fever and tachycardia hypoxia and increased weakness. She was noted to have a fever of 102 on admission. She is given a bolus of saline on admission and started on empiric antibiotics. X-rays revealed evidence of lower lobe pneumonia she was admitted to stepdown unit.  Hospital Course:  The patient remained lethargic and poorly responsive throughout the entire hospital stay. She is initially determined to be DO NOT RESUSCITATE. She was treated throughout her hospital stay with intravenous fluids and was on IV antibiotics vancomycin and Zosyn along with nasal oxygen. At times she would seem to be more alert but would not communicate. Her pneumonia which in part was felt to be aspirational did not improve much with long-term antibiotics. X-rays on 1113 revealed left basilar opacities on 1117 x-ray of chest dense left lower lobe collapse and consolidation and 1124 progression of left lower lobe pneumonia and effusion. She was seen in consultation by pulmonologist Dr. Juanetta [name] who agreed with continued supportive care.  She continued medications listed below and was finally able to be moved to MedSurg bed. She did have episodes of low serum potassium which had to be repleted on several occasions..  We communicated with patient's family and son and they did not want tube feedings, or PEG tube placement and they were not quite ready for hospice therefore we transferred her back to nursing facility The patient does have a systolic congestive heart failure and was continued on spironolactone. She did not have much edema and did not have pleural effusion.     Discharge Instructions [facility]    Medication List    TAKE these medications        clopidogrel 75 MG tablet  Commonly known as:  PLAVIX  Take 75 mg by mouth daily.     feeding supplement (PRO-STAT SUGAR FREE 64) Liqd  Take 30 mLs by mouth 3 (three) times daily with meals.     ferrous sulfate 325 (65 FE) MG tablet  Take 325 mg by mouth daily with breakfast. Crush and mix with applesauce.     HYDROcodone-acetaminophen 5-325 MG per tablet  Commonly known as:  NORCO/VICODIN  Take 1/2 tablet by mouth twice daily as needed for pain     ipratropium-albuterol 0.5-2.5 (3) MG/3ML Soln  Commonly known as:  DUONEB  Take 3 mLs by nebulization every 6 (six) hours as needed (shorntess of breath).     metoprolol succinate 25 MG 24 hr tablet  Commonly known as:  TOPROL-XL  Take 25 mg by mouth daily.     nystatin-triamcinolone cream  Commonly known as:  MYCOLOG  II  Apply topically 2 (two) times daily.     spironolactone 25 MG tablet  Commonly known as:  ALDACTONE  Take 12.5 mg by mouth daily.       No Known Allergies  The results of significant diagnostics from this hospitalization (including imaging, microbiology, ancillary and laboratory) are listed below for reference.    Significant Diagnostic Studies: Dg Chest Port 1 View  08/01/2014   CLINICAL DATA:  80 year old, septic, pneumonia,  left lower lobe infiltrate  EXAM: PORTABLE CHEST - 1 VIEW  COMPARISON:  07/25/2014  FINDINGS: The cardiac silhouette and mediastinal contours are unchanged.  The patient is slightly rotated.  Left lower lobe consolidation persists. Patchy areas of consolidation within the left upper lobe are now present. Diffuse interstitial prominence is unchanged bilaterally. There is no right pleural effusion. There is no pneumothorax.  The osseous structures are unchanged.  IMPRESSION: Slight interval progression of left lower lobe pneumonia/effusion. Probable developing left upper lobe pneumonitis.   Electronically Signed   By: [name]   On: 08/01/2014 15:35   Dg Chest Port 1 View  07/25/2014   CLINICAL DATA:  Pneumonia  EXAM: PORTABLE CHEST - 1 VIEW  COMPARISON:  07/21/2014  FINDINGS: Dense left lower lobe collapse/ consolidation persist. Associated left effusion not excluded. This appears slightly worse compared to 07/21/2014. Stable mild cardiomegaly with vascular congestion. Right lung remains relatively clear. No pneumothorax. Atherosclerosis of the aorta. Bones are osteopenic.  IMPRESSION: Slight worsening left lower lobe collapse/consolidation and likely associated effusion compatible with consolidative pneumonia.   Electronically Signed   By: [name] M.D.   On: 07/25/2014 08:45   Dg Chest Port 1 View  07/21/2014   CLINICAL DATA:  80 year old female with tachycardia and fever  EXAM: PORTABLE CHEST - 1 VIEW  COMPARISON:  Prior chest x-ray 05/27/2014  FINDINGS: Cardiac and mediastinal contours remain unchanged. Atherosclerotic calcifications are present within the transverse aorta. Increasing left basilar opacity now obscures the hemidiaphragm. Similar interstitial prominence and central bronchitic changes. No acute osseous abnormality. No pneumothorax.  IMPRESSION: 1. Increasing left basilar opacity now obscures the left hemidiaphragm. Differential considerations include pleural effusion with  atelectasis and left lower lobe pneumonia. 2. Stable mild cardiomegaly. 3. Aortic atherosclerosis.   Electronically Signed   By: [name] M.D.   On: 07/21/2014 13:40    Microbiology: No results found for this or any previous visit (from the past 240 hour(s)).   Labs: Basic Metabolic Panel:  Recent Labs Lab 07/30/14 0838 07/31/14 0549 07/31/14 1410 08/01/14 0757 08/02/14 0551  NA 144 145 142 142 142  K 2.4* 3.0* 4.0 3.3* 3.6*  CL 106 106 105 105 105  CO2 27 27 26 27 26   GLUCOSE 134* 107* 166* 86 157*  BUN 4* 4* 5* 6 8  CREATININE 0.57 0.66 0.65 0.67 0.65  CALCIUM 7.8* 8.1* 7.9* 7.9* 7.9*   Liver Function Tests: No results for input(s): AST, ALT, ALKPHOS, BILITOT, PROT, ALBUMIN in the last 168 hours. No results for input(s): LIPASE, AMYLASE in the last 168 hours. No results for input(s): AMMONIA in the last 168 hours. CBC: No results for input(s): WBC, NEUTROABS, HGB, HCT, MCV, PLT in the last 168 hours. Cardiac Enzymes: No results for input(s): CKTOTAL, CKMB, CKMBINDEX, TROPONINI in the last 168 hours. BNP: BNP (last 3 results)  Recent Labs  07/21/14 1311  PROBNP 3855.0*   CBG:  Recent Labs Lab 08/02/14 1234 08/02/14 1701 08/02/14 2047 08/03/14 0033 08/03/14 0436  GLUCAP 269*  130* 142* 160* 116*    Principal Problem:   Sepsis Active Problems:   Encephalopathy   HCAP (healthcare-associated pneumonia)   Hypernatremia   Dehydration   Hyperglycemia   Hypokalemia   Protein-calorie malnutrition, severe   Time coordinating discharge: 45 minutes  Signed:  Butch PennyAngus Ellwood Steidle, [name] 08/03/2014, 7:52 AM

## 2014-08-03 NOTE — Plan of Care (Signed)
Problem: Phase II Progression Outcomes Goal: Progress activity as tolerated unless otherwise ordered Outcome: Progressing Goal: Vital signs remain stable Outcome: Progressing Goal: IV changed to normal saline lock Outcome: Progressing     

## 2014-08-08 DEATH — deceased
